# Patient Record
Sex: Female | Born: 1995 | Race: White | Hispanic: No | Marital: Married | Smoking: Current some day smoker
Health system: Southern US, Community
[De-identification: ages and names within clinical notes are randomized; demographics above are authoritative.]

## PROBLEM LIST (undated history)

## (undated) ENCOUNTER — Inpatient Hospital Stay (HOSPITAL_COMMUNITY): Payer: 59

## (undated) DIAGNOSIS — Z0000 Encounter for general adult medical examination without abnormal findings: Secondary | ICD-10-CM

## (undated) DIAGNOSIS — G8929 Other chronic pain: Secondary | ICD-10-CM

## (undated) DIAGNOSIS — M25532 Pain in left wrist: Secondary | ICD-10-CM

## (undated) DIAGNOSIS — N289 Disorder of kidney and ureter, unspecified: Secondary | ICD-10-CM

## (undated) DIAGNOSIS — F32A Depression, unspecified: Secondary | ICD-10-CM

## (undated) DIAGNOSIS — T7840XA Allergy, unspecified, initial encounter: Secondary | ICD-10-CM

## (undated) DIAGNOSIS — N1370 Vesicoureteral-reflux, unspecified: Secondary | ICD-10-CM

## (undated) DIAGNOSIS — F419 Anxiety disorder, unspecified: Secondary | ICD-10-CM

## (undated) DIAGNOSIS — N10 Acute tubulo-interstitial nephritis: Secondary | ICD-10-CM

## (undated) DIAGNOSIS — F909 Attention-deficit hyperactivity disorder, unspecified type: Secondary | ICD-10-CM

## (undated) DIAGNOSIS — E282 Polycystic ovarian syndrome: Secondary | ICD-10-CM

## (undated) DIAGNOSIS — N809 Endometriosis, unspecified: Secondary | ICD-10-CM

## (undated) DIAGNOSIS — F329 Major depressive disorder, single episode, unspecified: Secondary | ICD-10-CM

## (undated) DIAGNOSIS — K219 Gastro-esophageal reflux disease without esophagitis: Secondary | ICD-10-CM

## (undated) DIAGNOSIS — R1031 Right lower quadrant pain: Secondary | ICD-10-CM

## (undated) DIAGNOSIS — Z8489 Family history of other specified conditions: Secondary | ICD-10-CM

## (undated) DIAGNOSIS — R109 Unspecified abdominal pain: Secondary | ICD-10-CM

## (undated) DIAGNOSIS — E785 Hyperlipidemia, unspecified: Secondary | ICD-10-CM

## (undated) HISTORY — DX: Endometriosis, unspecified: N80.9

## (undated) HISTORY — DX: Vesicoureteral-reflux, unspecified: N13.70

## (undated) HISTORY — DX: Polycystic ovarian syndrome: E28.2

## (undated) HISTORY — DX: Hyperlipidemia, unspecified: E78.5

## (undated) HISTORY — DX: Allergy, unspecified, initial encounter: T78.40XA

## (undated) HISTORY — PX: TYMPANOSTOMY TUBE PLACEMENT: SHX32

## (undated) HISTORY — PX: URETER SURGERY: SHX823

## (undated) HISTORY — DX: Encounter for general adult medical examination without abnormal findings: Z00.00

## (undated) HISTORY — DX: Gastro-esophageal reflux disease without esophagitis: K21.9

## (undated) HISTORY — DX: Other chronic pain: G89.29

## (undated) HISTORY — DX: Unspecified abdominal pain: R10.9

## (undated) HISTORY — DX: Pain in left wrist: M25.532

## (undated) HISTORY — DX: Disorder of kidney and ureter, unspecified: N28.9

## (undated) HISTORY — DX: Right lower quadrant pain: R10.31

## (undated) HISTORY — DX: Attention-deficit hyperactivity disorder, unspecified type: F90.9

## (undated) HISTORY — PX: KIDNEY SURGERY: SHX687

---
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
## 2017-01-01 ENCOUNTER — Other Ambulatory Visit: Payer: Self-pay

## 2017-01-01 ENCOUNTER — Other Ambulatory Visit (INDEPENDENT_AMBULATORY_CARE_PROVIDER_SITE_OTHER): Payer: 59

## 2017-01-01 DIAGNOSIS — R5383 Other fatigue: Secondary | ICD-10-CM | POA: Diagnosis not present

## 2017-01-01 DIAGNOSIS — R059 Cough, unspecified: Secondary | ICD-10-CM

## 2017-01-01 DIAGNOSIS — R05 Cough: Secondary | ICD-10-CM

## 2017-01-01 LAB — CBC WITH DIFFERENTIAL/PLATELET
BASOS ABS: 0.1 10*3/uL (ref 0.0–0.1)
Basophils Relative: 0.5 % (ref 0.0–3.0)
EOS ABS: 0.2 10*3/uL (ref 0.0–0.7)
Eosinophils Relative: 1.8 % (ref 0.0–5.0)
HEMATOCRIT: 43.4 % (ref 36.0–49.0)
HEMOGLOBIN: 14.8 g/dL (ref 12.0–16.0)
LYMPHS PCT: 23.6 % — AB (ref 24.0–48.0)
Lymphs Abs: 2.5 10*3/uL (ref 0.7–4.0)
MCHC: 34.1 g/dL (ref 31.0–37.0)
MCV: 87.8 fl (ref 78.0–98.0)
Monocytes Absolute: 0.5 10*3/uL (ref 0.1–1.0)
Monocytes Relative: 4.9 % (ref 3.0–12.0)
Neutro Abs: 7.4 10*3/uL (ref 1.4–7.7)
Neutrophils Relative %: 69.2 % (ref 43.0–71.0)
Platelets: 435 10*3/uL (ref 150.0–575.0)
RBC: 4.94 Mil/uL (ref 3.80–5.70)
RDW: 12.9 % (ref 11.4–15.5)
WBC: 10.6 10*3/uL (ref 4.5–13.5)

## 2017-01-01 LAB — BASIC METABOLIC PANEL
BUN: 9 mg/dL (ref 6–23)
CHLORIDE: 106 meq/L (ref 96–112)
CO2: 28 mEq/L (ref 19–32)
Calcium: 10 mg/dL (ref 8.4–10.5)
Creatinine, Ser: 0.7 mg/dL (ref 0.40–1.20)
GFR: 115 mL/min (ref >60.00)
GLUCOSE: 98 mg/dL (ref 70–99)
POTASSIUM: 4.2 meq/L (ref 3.5–5.1)
SODIUM: 140 meq/L (ref 135–145)

## 2017-02-03 ENCOUNTER — Ambulatory Visit: Payer: 59 | Admitting: Family Medicine

## 2017-02-23 ENCOUNTER — Telehealth: Payer: Self-pay | Admitting: Family Medicine

## 2017-02-23 DIAGNOSIS — Z309 Encounter for contraceptive management, unspecified: Secondary | ICD-10-CM

## 2017-02-23 NOTE — Telephone Encounter (Signed)
Referral placed.  Pc

## 2017-02-23 NOTE — Telephone Encounter (Signed)
Will send in referral to GYN  pc

## 2017-02-23 NOTE — Telephone Encounter (Signed)
°  Relation to UJ:[id number] Call back number:[phone]   Reason for call:  Patient requesting referral to GYN, patient would like etonogestrel (NEXPLANON) 68 MG IMPL implant removed and inserted, please advise

## 2017-02-23 NOTE — Telephone Encounter (Signed)
Dr Abner Greenspan stated she does not do them she can go to Dr. Carmelia Roller if she wants to.    PC

## 2017-03-25 ENCOUNTER — Encounter (HOSPITAL_COMMUNITY): Payer: Self-pay | Admitting: Emergency Medicine

## 2017-03-25 ENCOUNTER — Emergency Department (HOSPITAL_COMMUNITY): Payer: 59

## 2017-03-25 ENCOUNTER — Emergency Department (HOSPITAL_COMMUNITY)
Admission: EM | Admit: 2017-03-25 | Discharge: 2017-03-25 | Disposition: A | Discharge status: Home / Self Care | Payer: 59 | Attending: Emergency Medicine | Admitting: Emergency Medicine

## 2017-03-25 DIAGNOSIS — R109 Unspecified abdominal pain: Secondary | ICD-10-CM | POA: Diagnosis present

## 2017-03-25 DIAGNOSIS — Z793 Long term (current) use of hormonal contraceptives: Secondary | ICD-10-CM | POA: Insufficient documentation

## 2017-03-25 DIAGNOSIS — Z87891 Personal history of nicotine dependence: Secondary | ICD-10-CM | POA: Diagnosis not present

## 2017-03-25 DIAGNOSIS — R1031 Right lower quadrant pain: Secondary | ICD-10-CM | POA: Insufficient documentation

## 2017-03-25 LAB — COMPREHENSIVE METABOLIC PANEL
ALT: 9 U/L — ABNORMAL LOW (ref 14–54)
AST: 19 U/L (ref 15–41)
Albumin: 4 g/dL (ref 3.5–5.0)
Alkaline Phosphatase: 94 U/L (ref 38–126)
Anion gap: 9 (ref 5–15)
BUN: 9 mg/dL (ref 6–20)
CO2: 22 mmol/L (ref 22–32)
Calcium: 9.5 mg/dL (ref 8.9–10.3)
Chloride: 108 mmol/L (ref 101–111)
Creatinine, Ser: 0.75 mg/dL (ref 0.44–1.00)
GFR calc Af Amer: >60 mL/min (ref >60)
GFR calc non Af Amer: >60 mL/min (ref >60)
Glucose, Bld: 86 mg/dL (ref 65–99)
Potassium: 3.9 mmol/L (ref 3.5–5.1)
Sodium: 139 mmol/L (ref 135–145)
Total Bilirubin: 1 mg/dL (ref 0.3–1.2)
Total Protein: 7 g/dL (ref 6.5–8.1)

## 2017-03-25 LAB — URINALYSIS, ROUTINE W REFLEX MICROSCOPIC
Bilirubin Urine: NEGATIVE
Glucose, UA: NEGATIVE mg/dL
Hgb urine dipstick: NEGATIVE
Ketones, ur: NEGATIVE mg/dL
Leukocytes, UA: NEGATIVE
Nitrite: NEGATIVE
Protein, ur: NEGATIVE mg/dL
Specific Gravity, Urine: 1.023 (ref 1.005–1.030)
pH: 7 (ref 5.0–8.0)

## 2017-03-25 LAB — CBC WITH DIFFERENTIAL/PLATELET
Basophils Absolute: 0 10*3/uL (ref 0.0–0.1)
Basophils Relative: 0 %
Eosinophils Absolute: 0.2 10*3/uL (ref 0.0–0.7)
Eosinophils Relative: 1 %
HCT: 41.5 % (ref 36.0–46.0)
Hemoglobin: 14.1 g/dL (ref 12.0–15.0)
Lymphocytes Relative: 24 %
Lymphs Abs: 2.9 10*3/uL (ref 0.7–4.0)
MCH: 29.8 pg (ref 26.0–34.0)
MCHC: 34 g/dL (ref 30.0–36.0)
MCV: 87.7 fL (ref 78.0–100.0)
Monocytes Absolute: 0.7 10*3/uL (ref 0.1–1.0)
Monocytes Relative: 6 %
Neutro Abs: 8.3 10*3/uL — ABNORMAL HIGH (ref 1.7–7.7)
Neutrophils Relative %: 69 %
Platelets: 351 10*3/uL (ref 150–400)
RBC: 4.73 MIL/uL (ref 3.87–5.11)
RDW: 12.6 % (ref 11.5–15.5)
WBC: 12 10*3/uL — ABNORMAL HIGH (ref 4.0–10.5)

## 2017-03-25 LAB — LIPASE, BLOOD: Lipase: 23 U/L (ref 11–51)

## 2017-03-25 LAB — PREGNANCY, URINE: Preg Test, Ur: NEGATIVE

## 2017-03-25 MED ORDER — KETOROLAC TROMETHAMINE 30 MG/ML IJ SOLN
30.0000 mg | Freq: Once | INTRAMUSCULAR | Status: AC
  Administered 2017-03-25: 30 mg via INTRAVENOUS
  Filled 2017-03-25: qty 1

## 2017-03-25 MED ORDER — ONDANSETRON HCL 4 MG/2ML IJ SOLN
4.0000 mg | Freq: Once | INTRAMUSCULAR | Status: AC
  Administered 2017-03-25: 4 mg via INTRAVENOUS
  Filled 2017-03-25: qty 2

## 2017-03-25 MED ORDER — SODIUM CHLORIDE 0.9 % IV BOLUS (SEPSIS)
1000.0000 mL | Freq: Once | INTRAVENOUS | Status: AC
  Administered 2017-03-25: 1000 mL via INTRAVENOUS

## 2017-03-25 MED ORDER — IOPAMIDOL (ISOVUE-300) INJECTION 61%
INTRAVENOUS | Status: AC
  Filled 2017-03-25: qty 100

## 2017-03-25 MED ORDER — PROMETHAZINE HCL 25 MG PO TABS: 25.0000 mg | ORAL_TABLET | Freq: Three times a day (TID) | ORAL | 0 refills | Status: DC | PRN

## 2017-03-25 MED ORDER — TRAMADOL HCL 50 MG PO TABS: 50.0000 mg | ORAL_TABLET | Freq: Four times a day (QID) | ORAL | 0 refills | Status: DC | PRN

## 2017-03-25 NOTE — ED Triage Notes (Signed)
Pt sts right sided lower and flank pain x 3 days

## 2017-03-25 NOTE — ED Provider Notes (Signed)
[facility]-EMERGENCY DEPT Provider Note   CSN: [id number] Arrival date & time: 03/25/17  [id number]     History   Chief Complaint Chief Complaint  Patient presents with  . Abdominal Pain    HPI Misty Jimenez is a 21 y.o. female.  HPI Patient presents to the emergency department with right-sided abdominal pain that started yesterday.  She states she is chronically at payments area of is only when palpated, now it has been constant since last night.  She has also had nausea but no vomiting.  Patient states that nothing seems make the condition better.  She states palpation, does make the pain worse. The patient denies chest pain, shortness of breath, headache,blurred vision, neck pain, fever, cough, weakness, numbness, dizziness, anorexia, edema,  vomiting, diarrhea, rash, back pain, dysuria, hematemesis, bloody stool, near syncope, or syncope. Past Medical History:  Diagnosis Date  . Abdominal pain, chronic, right lower quadrant 08/03/2013  . Abdominal pain, recurrent    With Headache  . Acute pyelonephritis 05/07/2016   kidney surgery at 576 months of age  . ADHD (attention deficit hyperactivity disorder)   . Allergy   . Anxiety   . Depression   . Endometriosis   . Family history of adverse reaction to anesthesia    "grandmother gets PONV"  . GE reflux 12/16/2011  . Hyperlipidemia   . Kidney disease 05/07/2016  . PCOS (polycystic ovarian syndrome)   . Preventative health care 11/04/2016  . Reflux, vesicoureteral   . Wrist pain, left 11/04/2016    Patient Active Problem List   Diagnosis Date Noted  . Wrist pain, left 11/04/2016  . Preventative health care 11/04/2016  . Hyperlipidemia   . Obesity 05/07/2016  . Endometriosis 05/07/2016  . Kidney disease 05/07/2016  . PCOS (polycystic ovarian syndrome) 08/29/2013  . ADHD (attention deficit hyperactivity disorder) 08/03/2013  . 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 KIDNEY SURGERY     As a small child  . TYMPANOSTOMY TUBE PLACEMENT    . URETER SURGERY    . WRIST SURGERY Left 05/2016   10 pins and 2 plates    OB History    No data available       Home Medications    Prior to Admission medications   Medication Sig Start Date End Date Taking? Authorizing Provider  acetaminophen (TYLENOL) 500 MG tablet Take 500 mg by mouth every 6 (six) hours as needed for moderate pain.    [provider]  etonogestrel (NEXPLANON) 68 MG IMPL implant 1 each (68 mg total) by Subdermal route once. 11/24/14   Owens SharkPerry, Martha F, MD  ibuprofen (ADVIL,MOTRIN) 600 MG tablet Take 1 tablet (600 mg total) by mouth every 6 (six) hours as needed for headache or moderate pain. Patient not taking: Reported on 11/03/2016 05/10/16   Lora PaulaKrall, Jennifer T, MD  ondansetron (ZOFRAN-ODT) 8 MG disintegrating tablet Take 1 tablet (8 mg total) by mouth every 8 (eight) hours as needed for nausea or vomiting. Patient not taking: Reported on 11/03/2016 05/10/16   Lora PaulaKrall, Jennifer T, MD  polyethylene glycol [facility](MIRALAX / Ethelene HalGLYCOLAX) packet Take 17 g by mouth daily as needed for mild constipation. Patient not taking: Reported on 11/03/2016 05/10/16   Lora PaulaKrall, Jennifer T, MD  promethazine (PHENERGAN) 25 MG tablet Take 1 tablet (25 mg total) by mouth every 4 (four) hours as needed for nausea or vomiting. Patient not taking: Reported on 11/03/2016 05/10/16  Lora Paula, MD  senna (SENOKOT) 8.6 MG TABS tablet Take 2 tablets (17.2 mg total) by mouth at bedtime as needed for mild constipation. Patient not taking: Reported on 11/03/2016 05/10/16   Lora Paula, MD    Family History Family History  Problem Relation Age of Onset  . Kidney disease Mother   . Migraines Maternal Aunt   . Diabetes Maternal Grandfather   . Hyperlipidemia Other   . Hypertension Other   . Depression Other   . Alcohol abuse Father        and drug use, heroin, cocaine, marijuana  . Cancer Paternal Aunt         colon in 64s deceased    Social History Social History  Substance Use Topics  . Smoking status: Former Smoker    Years: 0.50    Types: Cigarettes    Quit date: 10/28/2015  . Smokeless tobacco: Former Neurosurgeon    Types: Chew  . Alcohol use Yes     Comment: 05/07/2016 "might have a few drinks/year"     Allergies   Sulfa antibiotics; Sulfasalazine; Other; Monosodium glutamate; and Sulfa drugs cross reactors   Review of Systems Review of Systems  All other systems negative except as documented in the HPI. All pertinent positives and negatives as reviewed in the HPI. Physical Exam Updated Vital Signs BP (!) 125/91   Pulse 69   Temp 98.5 F (36.9 C) (Oral)   Resp 20   SpO2 100%   Physical Exam  Constitutional: She is oriented to person, place, and time. She appears well-developed and well-nourished. No distress.  HENT:  Head: Normocephalic and atraumatic.  Mouth/Throat: Oropharynx is clear and moist.  Eyes: Pupils are equal, round, and reactive to light.  Neck: Normal range of motion. Neck supple.  Cardiovascular: Normal rate, regular rhythm and normal heart sounds.  Exam reveals no gallop and no friction rub.   No murmur heard. Pulmonary/Chest: Effort normal and breath sounds normal. No respiratory distress. She has no wheezes.  Abdominal: Soft. Bowel sounds are normal. She exhibits no distension. There is tenderness. There is no rebound and no guarding.    Neurological: She is alert and oriented to person, place, and time. She exhibits normal muscle tone. Coordination normal.  Skin: Skin is warm and dry. Capillary refill takes less than 2 seconds. No rash noted. No erythema.  Psychiatric: She has a normal mood and affect. Her behavior is normal.  Nursing note and vitals reviewed.    ED Treatments / Results  Labs (all labs ordered are listed, but only abnormal results are displayed) Labs Reviewed  COMPREHENSIVE METABOLIC PANEL - Abnormal; Notable for the following:         Result Value   ALT 9 (*)    All other components within normal limits  CBC WITH DIFFERENTIAL/PLATELET - Abnormal; Notable for the following:    WBC 12.0 (*)    Neutro Abs 8.3 (*)    All other components within normal limits  URINALYSIS, ROUTINE W REFLEX MICROSCOPIC  PREGNANCY, URINE  LIPASE, BLOOD    EKG  EKG Interpretation None       Radiology Ct Abdomen Pelvis W Contrast  Result Date: 03/25/2017 CLINICAL DATA:  Right lower quadrant pain for 3 days. History of polycystic ovarian syndrome. EXAM: CT ABDOMEN AND PELVIS WITH CONTRAST TECHNIQUE: Multidetector CT imaging of the abdomen and pelvis was performed using the standard protocol following bolus administration of intravenous contrast. CONTRAST:  100 cc of Isovue-300 COMPARISON:  05/08/2016 FINDINGS: Lower chest: Clear lung bases. Normal heart size without pericardial or pleural effusion. Hepatobiliary: Hepatomegaly at 19.4 cm craniocaudal. No focal liver lesion. Normal gallbladder, without biliary ductal dilatation. Pancreas: Normal, without mass or ductal dilatation. Spleen: Normal in size, without focal abnormality. Adrenals/Urinary Tract: Normal adrenal glands. Normal kidneys, without hydronephrosis. Normal urinary bladder. Stomach/Bowel: Normal stomach, without wall thickening. Scattered colonic diverticula. Normal terminal ileum. Probable diminutive appendix on image 64/ series 3. Normal small bowel. Vascular/Lymphatic: Normal caliber of the aorta and branch vessels. No abdominopelvic adenopathy. Reproductive: Normal uterus and adnexa. Other: No significant free fluid. Musculoskeletal: No acute osseous abnormality. IMPRESSION: 1.  No acute process or explanation for right lower quadrant pain. 2. Hepatomegaly. Electronically Signed   By: Jeronimo Greaves M.D.   On: 03/25/2017 13:08    Procedures Procedures (including critical care time)  Medications Ordered in ED Medications  iopamidol (ISOVUE-300) 61 % injection (not  administered)  sodium chloride 0.9 % bolus 1,000 mL (1,000 mLs Intravenous New Bag/Given 03/25/17 1123)  ondansetron (ZOFRAN) injection 4 mg (4 mg Intravenous Given 03/25/17 1314)     Initial Impression / Assessment and Plan / ED Course  I have reviewed the triage vital signs and the nursing notes.  Pertinent labs & imaging results that were available during my care of the patient were reviewed by me and considered in my medical decision making (see chart for details).    Patient's been observed over many hours in the emergency department.  She is received IV fluids along with pain medication and antiemetics.  Patient, CT scan does not show any significant abnormalities.  I do feel the patient will need follow-up with GI as this could be another etiology not yet detected on CT scan.  Patient is advised plan and all questions were answered  Final Clinical Impressions(s) / ED Diagnoses   Final diagnoses:  None    New Prescriptions New Prescriptions   No medications on file     Charlestine Night, PA-C 03/25/17 1413    Pricilla Loveless, MD 04/01/17 2356

## 2017-03-25 NOTE — Discharge Instructions (Signed)
Follow-up with the GI Dr. Provided.  Return here as needed.  Your CT scan did not show any significant abnormalities are laboratory testing also did not show any significant findings.

## 2017-03-25 NOTE — ED Notes (Signed)
Pt is in stable condition upon d/c and ambulates from ED. 

## 2017-04-20 NOTE — Telephone Encounter (Signed)
Done

## 2017-05-08 ENCOUNTER — Ambulatory Visit: Payer: 59 | Admitting: Family Medicine

## 2017-06-11 ENCOUNTER — Ambulatory Visit: Payer: 59 | Admitting: Family Medicine

## 2017-07-03 ENCOUNTER — Encounter: Payer: Self-pay | Admitting: Family Medicine

## 2017-07-03 ENCOUNTER — Ambulatory Visit (INDEPENDENT_AMBULATORY_CARE_PROVIDER_SITE_OTHER): Payer: 59 | Admitting: Family Medicine

## 2017-07-03 VITALS — BP 121/84 | HR 58 | Ht 67.0 in | Wt 265.0 lb

## 2017-07-03 DIAGNOSIS — Z01812 Encounter for preprocedural laboratory examination: Secondary | ICD-10-CM

## 2017-07-03 DIAGNOSIS — Z30017 Encounter for initial prescription of implantable subdermal contraceptive: Secondary | ICD-10-CM

## 2017-07-03 DIAGNOSIS — Z3202 Encounter for pregnancy test, result negative: Secondary | ICD-10-CM | POA: Diagnosis not present

## 2017-07-03 DIAGNOSIS — Z3046 Encounter for surveillance of implantable subdermal contraceptive: Secondary | ICD-10-CM

## 2017-07-03 DIAGNOSIS — Z3049 Encounter for surveillance of other contraceptives: Secondary | ICD-10-CM

## 2017-07-03 LAB — POCT URINE PREGNANCY: Preg Test, Ur: NEGATIVE

## 2017-07-03 MED ORDER — ETONOGESTREL 68 MG ~~LOC~~ IMPL
68.0000 mg | DRUG_IMPLANT | Freq: Once | SUBCUTANEOUS | Status: AC
  Administered 2017-07-03: 68 mg via SUBCUTANEOUS

## 2017-07-03 NOTE — Addendum Note (Signed)
Addended by: Anell BarrHOWARD, Dericka Ostenson L on: 07/03/2017 11:58 AM   Modules accepted: Orders

## 2017-07-03 NOTE — Progress Notes (Signed)
  Nexplanon Removal:  Patient given informed consent for removal of her Implanon, time out was performed.  Signed copy in the chart.  Appropriate time out taken. Implanon site identified.  Area prepped in usual sterile fashon. 8mL of 1% lidocaine was used to anesthetize the area at the distal end of the implant. A small stab incision was made right beside the implant on the distal portion.  The implanon rod was grasped using hemostats and removed without difficulty.  There was less than 3 cc blood loss. There were no complications.  A small amount of antibiotic ointment and steri-strips were applied over the small incision.  A pressure bandage was applied to reduce any bruising.  The patient tolerated the procedure well and was given post procedure instructions.   Nexplanon Insertion:  Patient given informed consent, signed copy in the chart, time out was performed. Pregnancy test was neg. Appropriate time out taken.  Patient's left arm was prepped and draped in the usual sterile fashion.. The ruler used to measure and mark insertion area.  Pt was prepped with alcohol swab and then injected with 8 cc of 1% lidocaine with epinephrine.  Pt was prepped with betadine, Implanon removed form packaging,  Device confirmed in needle, then inserted full length of needle and withdrawn per handbook instructions.  Device palpated by physician and patient.  Pt insertion site covered with pressure dressing.   Minimal blood loss.  Pt tolerated the procedure well.

## 2017-07-23 DIAGNOSIS — N719 Inflammatory disease of uterus, unspecified: Secondary | ICD-10-CM | POA: Insufficient documentation

## 2017-07-23 HISTORY — DX: Inflammatory disease of uterus, unspecified: N71.9

## 2017-10-29 ENCOUNTER — Encounter: Payer: 59 | Admitting: Family Medicine

## 2018-03-12 ENCOUNTER — Other Ambulatory Visit: Payer: Self-pay

## 2018-07-27 ENCOUNTER — Emergency Department (HOSPITAL_BASED_OUTPATIENT_CLINIC_OR_DEPARTMENT_OTHER): Payer: 59

## 2018-07-27 ENCOUNTER — Other Ambulatory Visit: Payer: Self-pay

## 2018-07-27 ENCOUNTER — Inpatient Hospital Stay (HOSPITAL_BASED_OUTPATIENT_CLINIC_OR_DEPARTMENT_OTHER)
Admission: EM | Admit: 2018-07-27 | Discharge: 2018-07-29 | DRG: 202 | Disposition: A | Discharge status: Home / Self Care | Payer: 59 | Attending: Family Medicine | Admitting: Family Medicine

## 2018-07-27 ENCOUNTER — Encounter (HOSPITAL_BASED_OUTPATIENT_CLINIC_OR_DEPARTMENT_OTHER): Payer: Self-pay

## 2018-07-27 DIAGNOSIS — J209 Acute bronchitis, unspecified: Secondary | ICD-10-CM | POA: Diagnosis present

## 2018-07-27 DIAGNOSIS — J206 Acute bronchitis due to rhinovirus: Secondary | ICD-10-CM | POA: Diagnosis not present

## 2018-07-27 DIAGNOSIS — D72829 Elevated white blood cell count, unspecified: Secondary | ICD-10-CM

## 2018-07-27 DIAGNOSIS — R000 Tachycardia, unspecified: Secondary | ICD-10-CM | POA: Diagnosis not present

## 2018-07-27 DIAGNOSIS — Z841 Family history of disorders of kidney and ureter: Secondary | ICD-10-CM

## 2018-07-27 DIAGNOSIS — R45851 Suicidal ideations: Secondary | ICD-10-CM

## 2018-07-27 DIAGNOSIS — E785 Hyperlipidemia, unspecified: Secondary | ICD-10-CM | POA: Diagnosis present

## 2018-07-27 DIAGNOSIS — Z818 Family history of other mental and behavioral disorders: Secondary | ICD-10-CM

## 2018-07-27 DIAGNOSIS — Z800 Family history of malignant neoplasm of digestive organs: Secondary | ICD-10-CM

## 2018-07-27 DIAGNOSIS — F17210 Nicotine dependence, cigarettes, uncomplicated: Secondary | ICD-10-CM | POA: Diagnosis present

## 2018-07-27 DIAGNOSIS — J9601 Acute respiratory failure with hypoxia: Secondary | ICD-10-CM | POA: Diagnosis present

## 2018-07-27 DIAGNOSIS — F332 Major depressive disorder, recurrent severe without psychotic features: Secondary | ICD-10-CM

## 2018-07-27 DIAGNOSIS — T59891A Toxic effect of other specified gases, fumes and vapors, accidental (unintentional), initial encounter: Secondary | ICD-10-CM | POA: Diagnosis present

## 2018-07-27 DIAGNOSIS — Z882 Allergy status to sulfonamides status: Secondary | ICD-10-CM

## 2018-07-27 DIAGNOSIS — Z811 Family history of alcohol abuse and dependence: Secondary | ICD-10-CM

## 2018-07-27 DIAGNOSIS — F909 Attention-deficit hyperactivity disorder, unspecified type: Secondary | ICD-10-CM | POA: Diagnosis present

## 2018-07-27 DIAGNOSIS — F17290 Nicotine dependence, other tobacco product, uncomplicated: Secondary | ICD-10-CM | POA: Diagnosis present

## 2018-07-27 DIAGNOSIS — E282 Polycystic ovarian syndrome: Secondary | ICD-10-CM | POA: Diagnosis present

## 2018-07-27 DIAGNOSIS — K219 Gastro-esophageal reflux disease without esophagitis: Secondary | ICD-10-CM | POA: Diagnosis present

## 2018-07-27 DIAGNOSIS — Z833 Family history of diabetes mellitus: Secondary | ICD-10-CM

## 2018-07-27 DIAGNOSIS — J40 Bronchitis, not specified as acute or chronic: Secondary | ICD-10-CM

## 2018-07-27 DIAGNOSIS — Z888 Allergy status to other drugs, medicaments and biological substances status: Secondary | ICD-10-CM

## 2018-07-27 DIAGNOSIS — Z915 Personal history of self-harm: Secondary | ICD-10-CM

## 2018-07-27 LAB — URINALYSIS, ROUTINE W REFLEX MICROSCOPIC
BILIRUBIN URINE: NEGATIVE
Glucose, UA: NEGATIVE mg/dL
Hgb urine dipstick: NEGATIVE
KETONES UR: NEGATIVE mg/dL
Leukocytes, UA: NEGATIVE
NITRITE: NEGATIVE
PH: 6.5 (ref 5.0–8.0)
Protein, ur: NEGATIVE mg/dL
Specific Gravity, Urine: 1.015 (ref 1.005–1.030)

## 2018-07-27 LAB — TROPONIN I

## 2018-07-27 LAB — CBC WITH DIFFERENTIAL/PLATELET
BASOS PCT: 0 %
Basophils Absolute: 0 10*3/uL (ref 0.0–0.1)
Eosinophils Absolute: 0.2 10*3/uL (ref 0.0–0.7)
Eosinophils Relative: 1 %
HCT: 43.6 % (ref 36.0–46.0)
HEMOGLOBIN: 15.1 g/dL — AB (ref 12.0–15.0)
Lymphocytes Relative: 9 %
Lymphs Abs: 1.8 10*3/uL (ref 0.7–4.0)
MCH: 30.8 pg (ref 26.0–34.0)
MCHC: 34.6 g/dL (ref 30.0–36.0)
MCV: 89 fL (ref 78.0–100.0)
MONO ABS: 1.2 10*3/uL — AB (ref 0.1–1.0)
MONOS PCT: 6 %
NEUTROS PCT: 84 %
Neutro Abs: 17.3 10*3/uL — ABNORMAL HIGH (ref 1.7–7.7)
Platelets: 361 10*3/uL (ref 150–400)
RBC: 4.9 MIL/uL (ref 3.87–5.11)
RDW: 12.9 % (ref 11.5–15.5)
WBC: 20.5 10*3/uL — ABNORMAL HIGH (ref 4.0–10.5)

## 2018-07-27 LAB — BASIC METABOLIC PANEL
Anion gap: 8 (ref 5–15)
BUN: 8 mg/dL (ref 6–20)
CALCIUM: 9.2 mg/dL (ref 8.9–10.3)
CO2: 23 mmol/L (ref 22–32)
CREATININE: 0.83 mg/dL (ref 0.44–1.00)
Chloride: 109 mmol/L (ref 98–111)
GFR calc non Af Amer: >60 mL/min (ref >60)
Glucose, Bld: 96 mg/dL (ref 70–99)
Potassium: 3.6 mmol/L (ref 3.5–5.1)
SODIUM: 140 mmol/L (ref 135–145)

## 2018-07-27 LAB — PREGNANCY, URINE: Preg Test, Ur: NEGATIVE

## 2018-07-27 MED ORDER — IPRATROPIUM-ALBUTEROL 0.5-2.5 (3) MG/3ML IN SOLN
3.0000 mL | Freq: Four times a day (QID) | RESPIRATORY_TRACT | Status: DC
  Administered 2018-07-27 – 2018-07-28 (×2): 3 mL via RESPIRATORY_TRACT
  Filled 2018-07-27 (×2): qty 3

## 2018-07-27 MED ORDER — ONDANSETRON HCL 4 MG/2ML IJ SOLN
4.0000 mg | Freq: Once | INTRAMUSCULAR | Status: AC
  Administered 2018-07-28: 4 mg via INTRAVENOUS
  Filled 2018-07-27: qty 2

## 2018-07-27 NOTE — ED Notes (Signed)
Pt rang out and stated she "is about to pass out."  Sat pt up in bed and put the pulse ox on her, encouraged to take slow breaths in through her nose and out through her mouth. Pt verbalized relief of symptoms

## 2018-07-27 NOTE — ED Triage Notes (Signed)
C/o lower back, bilat flank area pain x 1 week-NAD-steady gait

## 2018-07-28 ENCOUNTER — Encounter (HOSPITAL_COMMUNITY): Payer: Self-pay | Admitting: Internal Medicine

## 2018-07-28 ENCOUNTER — Other Ambulatory Visit: Payer: Self-pay

## 2018-07-28 ENCOUNTER — Inpatient Hospital Stay (HOSPITAL_COMMUNITY): Payer: 59

## 2018-07-28 DIAGNOSIS — J206 Acute bronchitis due to rhinovirus: Secondary | ICD-10-CM | POA: Diagnosis not present

## 2018-07-28 DIAGNOSIS — F17290 Nicotine dependence, other tobacco product, uncomplicated: Secondary | ICD-10-CM | POA: Diagnosis not present

## 2018-07-28 DIAGNOSIS — E785 Hyperlipidemia, unspecified: Secondary | ICD-10-CM | POA: Diagnosis not present

## 2018-07-28 DIAGNOSIS — Z811 Family history of alcohol abuse and dependence: Secondary | ICD-10-CM | POA: Diagnosis not present

## 2018-07-28 DIAGNOSIS — Z833 Family history of diabetes mellitus: Secondary | ICD-10-CM | POA: Diagnosis not present

## 2018-07-28 DIAGNOSIS — R000 Tachycardia, unspecified: Secondary | ICD-10-CM | POA: Diagnosis present

## 2018-07-28 DIAGNOSIS — J9601 Acute respiratory failure with hypoxia: Secondary | ICD-10-CM | POA: Diagnosis present

## 2018-07-28 DIAGNOSIS — J209 Acute bronchitis, unspecified: Secondary | ICD-10-CM | POA: Diagnosis present

## 2018-07-28 DIAGNOSIS — Z818 Family history of other mental and behavioral disorders: Secondary | ICD-10-CM | POA: Diagnosis not present

## 2018-07-28 DIAGNOSIS — F909 Attention-deficit hyperactivity disorder, unspecified type: Secondary | ICD-10-CM | POA: Diagnosis not present

## 2018-07-28 DIAGNOSIS — R45851 Suicidal ideations: Secondary | ICD-10-CM

## 2018-07-28 DIAGNOSIS — G4700 Insomnia, unspecified: Secondary | ICD-10-CM | POA: Diagnosis not present

## 2018-07-28 DIAGNOSIS — Z915 Personal history of self-harm: Secondary | ICD-10-CM | POA: Diagnosis not present

## 2018-07-28 DIAGNOSIS — Z882 Allergy status to sulfonamides status: Secondary | ICD-10-CM | POA: Diagnosis not present

## 2018-07-28 DIAGNOSIS — K219 Gastro-esophageal reflux disease without esophagitis: Secondary | ICD-10-CM | POA: Diagnosis not present

## 2018-07-28 DIAGNOSIS — Z888 Allergy status to other drugs, medicaments and biological substances status: Secondary | ICD-10-CM | POA: Diagnosis not present

## 2018-07-28 DIAGNOSIS — Z800 Family history of malignant neoplasm of digestive organs: Secondary | ICD-10-CM | POA: Diagnosis not present

## 2018-07-28 DIAGNOSIS — E282 Polycystic ovarian syndrome: Secondary | ICD-10-CM | POA: Diagnosis not present

## 2018-07-28 DIAGNOSIS — F419 Anxiety disorder, unspecified: Secondary | ICD-10-CM | POA: Diagnosis not present

## 2018-07-28 DIAGNOSIS — F17210 Nicotine dependence, cigarettes, uncomplicated: Secondary | ICD-10-CM | POA: Diagnosis not present

## 2018-07-28 DIAGNOSIS — Z841 Family history of disorders of kidney and ureter: Secondary | ICD-10-CM | POA: Diagnosis not present

## 2018-07-28 DIAGNOSIS — F332 Major depressive disorder, recurrent severe without psychotic features: Secondary | ICD-10-CM | POA: Diagnosis present

## 2018-07-28 DIAGNOSIS — T59891A Toxic effect of other specified gases, fumes and vapors, accidental (unintentional), initial encounter: Secondary | ICD-10-CM | POA: Diagnosis not present

## 2018-07-28 LAB — PROCALCITONIN: Procalcitonin: <0.1 ng/mL

## 2018-07-28 LAB — CBC WITH DIFFERENTIAL/PLATELET
Basophils Absolute: 0 10*3/uL (ref 0.0–0.1)
Basophils Relative: 0 %
Eosinophils Absolute: 0 10*3/uL (ref 0.0–0.7)
Eosinophils Relative: 0 %
HCT: 38.7 % (ref 36.0–46.0)
HEMOGLOBIN: 13 g/dL (ref 12.0–15.0)
LYMPHS ABS: 0.5 10*3/uL — AB (ref 0.7–4.0)
Lymphocytes Relative: 3 %
MCH: 30.7 pg (ref 26.0–34.0)
MCHC: 33.6 g/dL (ref 30.0–36.0)
MCV: 91.5 fL (ref 78.0–100.0)
MONO ABS: 0.1 10*3/uL (ref 0.1–1.0)
MONOS PCT: 1 %
NEUTROS PCT: 96 %
Neutro Abs: 16.4 10*3/uL — ABNORMAL HIGH (ref 1.7–7.7)
PLATELETS: 351 10*3/uL (ref 150–400)
RBC: 4.23 MIL/uL (ref 3.87–5.11)
RDW: 13.1 % (ref 11.5–15.5)
WBC: 17 10*3/uL — ABNORMAL HIGH (ref 4.0–10.5)

## 2018-07-28 LAB — BASIC METABOLIC PANEL
Anion gap: 12 (ref 5–15)
BUN: 8 mg/dL (ref 6–20)
CHLORIDE: 113 mmol/L — AB (ref 98–111)
CO2: 20 mmol/L — AB (ref 22–32)
CREATININE: 0.78 mg/dL (ref 0.44–1.00)
Calcium: 8.6 mg/dL — ABNORMAL LOW (ref 8.9–10.3)
GFR calc non Af Amer: >60 mL/min (ref >60)
GLUCOSE: 157 mg/dL — AB (ref 70–99)
Potassium: 3.4 mmol/L — ABNORMAL LOW (ref 3.5–5.1)
Sodium: 145 mmol/L (ref 135–145)

## 2018-07-28 LAB — STREP PNEUMONIAE URINARY ANTIGEN: STREP PNEUMO URINARY ANTIGEN: NEGATIVE

## 2018-07-28 LAB — RESPIRATORY PANEL BY PCR
ADENOVIRUS-RVPPCR: NOT DETECTED
Bordetella pertussis: NOT DETECTED
CHLAMYDOPHILA PNEUMONIAE-RVPPCR: NOT DETECTED
CORONAVIRUS HKU1-RVPPCR: NOT DETECTED
CORONAVIRUS NL63-RVPPCR: NOT DETECTED
CORONAVIRUS OC43-RVPPCR: NOT DETECTED
Coronavirus 229E: NOT DETECTED
INFLUENZA A-RVPPCR: NOT DETECTED
Influenza B: NOT DETECTED
METAPNEUMOVIRUS-RVPPCR: NOT DETECTED
Mycoplasma pneumoniae: NOT DETECTED
PARAINFLUENZA VIRUS 1-RVPPCR: NOT DETECTED
PARAINFLUENZA VIRUS 2-RVPPCR: NOT DETECTED
PARAINFLUENZA VIRUS 3-RVPPCR: NOT DETECTED
PARAINFLUENZA VIRUS 4-RVPPCR: NOT DETECTED
RHINOVIRUS / ENTEROVIRUS - RVPPCR: DETECTED — AB
Respiratory Syncytial Virus: NOT DETECTED

## 2018-07-28 LAB — HIV ANTIBODY (ROUTINE TESTING W REFLEX): HIV Screen 4th Generation wRfx: NONREACTIVE

## 2018-07-28 LAB — HEPATIC FUNCTION PANEL
ALBUMIN: 3.6 g/dL (ref 3.5–5.0)
ALT: 9 U/L (ref 0–44)
AST: 22 U/L (ref 15–41)
Alkaline Phosphatase: 75 U/L (ref 38–126)
BILIRUBIN INDIRECT: 0.6 mg/dL (ref 0.3–0.9)
Bilirubin, Direct: 0.1 mg/dL (ref 0.0–0.2)
TOTAL PROTEIN: 6.4 g/dL — AB (ref 6.5–8.1)
Total Bilirubin: 0.7 mg/dL (ref 0.3–1.2)

## 2018-07-28 LAB — TSH: TSH: 0.774 u[IU]/mL (ref 0.350–4.500)

## 2018-07-28 LAB — RAPID URINE DRUG SCREEN, HOSP PERFORMED
AMPHETAMINES: NOT DETECTED
BARBITURATES: NOT DETECTED
BENZODIAZEPINES: POSITIVE — AB
COCAINE: NOT DETECTED
Opiates: NOT DETECTED
Tetrahydrocannabinol: POSITIVE — AB

## 2018-07-28 LAB — LACTIC ACID, PLASMA
Lactic Acid, Venous: 3.8 mmol/L (ref 0.5–1.9)
Lactic Acid, Venous: 3.2 mmol/L (ref 0.5–1.9)

## 2018-07-28 LAB — INFLUENZA PANEL BY PCR (TYPE A & B)
INFLBPCR: NEGATIVE
Influenza A By PCR: NEGATIVE

## 2018-07-28 LAB — I-STAT CG4 LACTIC ACID, ED: Lactic Acid, Venous: 2.83 mmol/L (ref 0.5–1.9)

## 2018-07-28 LAB — D-DIMER, QUANTITATIVE: D-Dimer, Quant: <0.27 ug/mL-FEU (ref 0.00–0.50)

## 2018-07-28 LAB — TROPONIN I: Troponin I: <0.03 ng/mL (ref <0.03)

## 2018-07-28 MED ORDER — SODIUM CHLORIDE 0.9 % IV BOLUS
1000.0000 mL | Freq: Once | INTRAVENOUS | Status: AC
  Administered 2018-07-28: 1000 mL via INTRAVENOUS

## 2018-07-28 MED ORDER — ALBUTEROL SULFATE (2.5 MG/3ML) 0.083% IN NEBU
5.0000 mg | INHALATION_SOLUTION | Freq: Once | RESPIRATORY_TRACT | Status: AC
  Administered 2018-07-28: 5 mg via RESPIRATORY_TRACT
  Filled 2018-07-28: qty 6

## 2018-07-28 MED ORDER — IOPAMIDOL (ISOVUE-370) INJECTION 76%
100.0000 mL | Freq: Once | INTRAVENOUS | Status: AC | PRN
  Administered 2018-07-28: 100 mL via INTRAVENOUS

## 2018-07-28 MED ORDER — ENSURE ENLIVE PO LIQD
237.0000 mL | Freq: Two times a day (BID) | ORAL | Status: DC
  Administered 2018-07-28 – 2018-07-29 (×2): 237 mL via ORAL

## 2018-07-28 MED ORDER — IPRATROPIUM BROMIDE 0.02 % IN SOLN: 0.5000 mg | RESPIRATORY_TRACT | Status: DC

## 2018-07-28 MED ORDER — LEVOFLOXACIN IN D5W 750 MG/150ML IV SOLN
750.0000 mg | Freq: Every day | INTRAVENOUS | Status: DC
  Administered 2018-07-29: 750 mg via INTRAVENOUS
  Filled 2018-07-28: qty 150

## 2018-07-28 MED ORDER — AZITHROMYCIN 500 MG IV SOLR
INTRAVENOUS | Status: AC
  Filled 2018-07-28: qty 500

## 2018-07-28 MED ORDER — ACETAMINOPHEN 650 MG RE SUPP: 650.0000 mg | Freq: Four times a day (QID) | RECTAL | Status: DC | PRN

## 2018-07-28 MED ORDER — ONDANSETRON HCL 4 MG/2ML IJ SOLN
4.0000 mg | Freq: Four times a day (QID) | INTRAMUSCULAR | Status: DC | PRN
  Administered 2018-07-28 – 2018-07-29 (×3): 4 mg via INTRAVENOUS
  Filled 2018-07-28 (×3): qty 2

## 2018-07-28 MED ORDER — IPRATROPIUM-ALBUTEROL 0.5-2.5 (3) MG/3ML IN SOLN
3.0000 mL | Freq: Two times a day (BID) | RESPIRATORY_TRACT | Status: DC
  Administered 2018-07-28 – 2018-07-29 (×2): 3 mL via RESPIRATORY_TRACT
  Filled 2018-07-28 (×2): qty 3

## 2018-07-28 MED ORDER — ACETAMINOPHEN 325 MG PO TABS
650.0000 mg | ORAL_TABLET | Freq: Four times a day (QID) | ORAL | Status: DC | PRN
  Administered 2018-07-28 (×2): 650 mg via ORAL
  Filled 2018-07-28 (×2): qty 2

## 2018-07-28 MED ORDER — ALBUTEROL SULFATE (2.5 MG/3ML) 0.083% IN NEBU: 2.5000 mg | INHALATION_SOLUTION | RESPIRATORY_TRACT | Status: DC

## 2018-07-28 MED ORDER — SODIUM CHLORIDE 0.9 % IJ SOLN
INTRAMUSCULAR | Status: AC
  Filled 2018-07-28: qty 50

## 2018-07-28 MED ORDER — ONDANSETRON HCL 4 MG PO TABS: 4.0000 mg | ORAL_TABLET | Freq: Four times a day (QID) | ORAL | Status: DC | PRN

## 2018-07-28 MED ORDER — METHYLPREDNISOLONE SODIUM SUCC 125 MG IJ SOLR
125.0000 mg | Freq: Once | INTRAMUSCULAR | Status: AC
  Administered 2018-07-28: 125 mg via INTRAVENOUS
  Filled 2018-07-28: qty 2

## 2018-07-28 MED ORDER — SODIUM CHLORIDE 0.9 % IV SOLN
1.0000 g | Freq: Once | INTRAVENOUS | Status: AC
  Administered 2018-07-28: 1 g via INTRAVENOUS
  Filled 2018-07-28: qty 10

## 2018-07-28 MED ORDER — ARFORMOTEROL TARTRATE 15 MCG/2ML IN NEBU
15.0000 ug | INHALATION_SOLUTION | Freq: Two times a day (BID) | RESPIRATORY_TRACT | Status: DC
  Administered 2018-07-28 – 2018-07-29 (×2): 15 ug via RESPIRATORY_TRACT
  Filled 2018-07-28 (×2): qty 2

## 2018-07-28 MED ORDER — BUDESONIDE 0.25 MG/2ML IN SUSP
0.2500 mg | Freq: Two times a day (BID) | RESPIRATORY_TRACT | Status: DC
  Administered 2018-07-28 – 2018-07-29 (×3): 0.25 mg via RESPIRATORY_TRACT
  Filled 2018-07-28 (×3): qty 2

## 2018-07-28 MED ORDER — KETOROLAC TROMETHAMINE 30 MG/ML IJ SOLN
15.0000 mg | Freq: Once | INTRAMUSCULAR | Status: AC
Start: 2018-07-28 — End: 2018-07-28
  Administered 2018-07-28: 15 mg via INTRAVENOUS
  Filled 2018-07-28: qty 1

## 2018-07-28 MED ORDER — METHYLPREDNISOLONE SODIUM SUCC 40 MG IJ SOLR
40.0000 mg | Freq: Two times a day (BID) | INTRAMUSCULAR | Status: DC
  Administered 2018-07-28: 40 mg via INTRAVENOUS
  Filled 2018-07-28: qty 1

## 2018-07-28 MED ORDER — ENOXAPARIN SODIUM 40 MG/0.4ML ~~LOC~~ SOLN
40.0000 mg | Freq: Every day | SUBCUTANEOUS | Status: DC
  Administered 2018-07-28 – 2018-07-29 (×2): 40 mg via SUBCUTANEOUS
  Filled 2018-07-28 (×2): qty 0.4

## 2018-07-28 MED ORDER — ACETAMINOPHEN 10 MG/ML IV SOLN
1000.0000 mg | Freq: Once | INTRAVENOUS | Status: AC
  Administered 2018-07-28: 1000 mg via INTRAVENOUS
  Filled 2018-07-28 (×3): qty 100

## 2018-07-28 MED ORDER — AZITHROMYCIN 500 MG IV SOLR
500.0000 mg | Freq: Once | INTRAVENOUS | Status: AC
  Administered 2018-07-28: 500 mg via INTRAVENOUS
  Filled 2018-07-28: qty 500

## 2018-07-28 MED ORDER — IPRATROPIUM-ALBUTEROL 0.5-2.5 (3) MG/3ML IN SOLN
3.0000 mL | Freq: Four times a day (QID) | RESPIRATORY_TRACT | Status: DC
  Administered 2018-07-28: 3 mL via RESPIRATORY_TRACT
  Filled 2018-07-28: qty 3

## 2018-07-28 MED ORDER — IOHEXOL 300 MG/ML  SOLN
100.0000 mL | Freq: Once | INTRAMUSCULAR | Status: AC | PRN
  Administered 2018-07-28: 100 mL via INTRAVENOUS

## 2018-07-28 MED ORDER — ALBUTEROL SULFATE (2.5 MG/3ML) 0.083% IN NEBU
2.5000 mg | INHALATION_SOLUTION | RESPIRATORY_TRACT | Status: DC | PRN
  Administered 2018-07-29: 2.5 mg via RESPIRATORY_TRACT
  Filled 2018-07-28: qty 3

## 2018-07-28 MED ORDER — TRAMADOL HCL 50 MG PO TABS
50.0000 mg | ORAL_TABLET | Freq: Four times a day (QID) | ORAL | Status: AC | PRN
  Administered 2018-07-28 – 2018-07-29 (×2): 50 mg via ORAL
  Filled 2018-07-28 (×2): qty 1

## 2018-07-28 MED ORDER — KETOROLAC TROMETHAMINE 15 MG/ML IJ SOLN
15.0000 mg | Freq: Once | INTRAMUSCULAR | Status: AC
  Administered 2018-07-28: 15 mg via INTRAVENOUS
  Filled 2018-07-28: qty 1

## 2018-07-28 MED ORDER — METHYLPREDNISOLONE SODIUM SUCC 40 MG IJ SOLR
40.0000 mg | Freq: Every day | INTRAMUSCULAR | Status: DC
  Administered 2018-07-28: 40 mg via INTRAVENOUS
  Filled 2018-07-28: qty 1

## 2018-07-28 NOTE — ED Notes (Signed)
Patient transported to CT 

## 2018-07-28 NOTE — ED Notes (Signed)
Returned from CT.

## 2018-07-28 NOTE — Progress Notes (Signed)
Initial Nutrition Assessment  DOCUMENTATION CODES:   Morbid obesity  INTERVENTION:  - Continue Ensure Enlive po BID, each supplement provides 350 kcal and 20 grams of protein. - Continue to encourage PO intakes. - Weigh patient prior to d/c, per her request.    NUTRITION DIAGNOSIS:   Inadequate oral intake related to acute illness, nausea, vomiting as evidenced by per patient/family report, meal completion < 50%.  GOAL:   Patient will meet greater than or equal to 90% of their needs  MONITOR:   PO intake, Supplement acceptance, Weight trends, Labs, I & O's  REASON FOR ASSESSMENT:   Malnutrition Screening Tool  ASSESSMENT:   22 y.o. female with history of tobacco abuse and vapes. She presented to the ED at [facility] with complaints of SOB, productive cough, wheezing, and chest tightness for 2 days. The last time she vaped was 2 days ago. She also reports low back pain around the flank and was concerned about having a UTI. Patient had subjective feeling of fever and chills. Also complained of suicidal thoughts; no suicide attempts. CT angiogram also showed heterogeneous opacities concerning for airway disease. Patient admitted for acute respiratory failure with bronchitis symptoms.  No intakes documented since admission. Patient reports that she ate 50% of breakfast and consumed some Ensure but then vomited shortly after. She was feeling nauseated prior to vomiting. Patient reports that , she has had a very good appetite and that she was even eating more than usual without any issues. She would like to maintain order for Ensure at this time.   She reports having a MSG sensitivity and that when it reaches a certain threshold in her blood she will have rosy cheeks, feel weak and diaphoretic, and will often vomit. Sometimes it can lead to diarrhea, but this is infrequent. Patient states that the number of episodes per month is highly variable, dependent on the foods she  consumes, but that it has not seemed better or worse over the past 1 month.   Patient states that in December 2017 she weighed 303 lb. Patient reports that since her admission last month, she has lost 14 lb unintentionally despite eating very well and no increases in vomiting d/t MSG sensitivity. Per chart review, current weight is 252 lb and weight on 07/03/17 was 264 lb. This indicates 12 lb weight loss (4.5% body weight) in the past 13 months; this is not significant for time frame.   Medications reviewed; 125 mg Solu-medrol/day.  Labs reviewed; K: 3.4 mmol/L, Cl: 113 mmol/L, Ca: 8.6 mg/dL.      NUTRITION - FOCUSED PHYSICAL EXAM:  Completed; no muscle and no fat wasting.   Diet Order:   Diet Order            Diet regular Room service appropriate? Yes; Fluid consistency: Thin  Diet effective now              EDUCATION NEEDS:   No education needs have been identified at this time  Skin:  Skin Assessment: Reviewed RN Assessment  Last BM:  9/30  Height:   Ht Readings from Last 1 Encounters:  07/27/18 5\' 7"  (1.702 m)    Weight:   Wt Readings from Last 1 Encounters:  07/28/18 114.1 kg    Ideal Body Weight:  61.36 kg  BMI:  Body mass index is 39.41 kg/m.  Estimated Nutritional Needs:   Kcal:  1940-2170 (17-19 kcal/kg)  Protein:  110-120 grams  Fluid:  >/= 2 L/day  Jarome Matin, MS, RD, LDN, [facility] Clinical Dietitian Pager # [phone] After hours/weekend pager # [phone]

## 2018-07-28 NOTE — H&P (Signed)
History and Physical    [place] ZOX:[id number] DOB: [birth year] DOA: 07/27/2018  PCP: System, Provider Not In  Patient coming from: Home.  Chief Complaint: Shortness of breath and chest tightness.  HPI: Misty Jimenez is a 22 y.o. female with history of tobacco abuse and also uses a vape presents to the ER at [facility] with complaints of shortness of breath and chest tightness over the last 2 days.  The last time she used Vape was 2 days ago.  Denies any sick contacts or recent travel.  Has been having productive cough with wheezing last 2 days.  Also has benign low back pain around the flank and was concerned about having a UTI.  Denies any dysuria or discharges or hematuria.  Patient had subjective feeling of fever and chills.  Also complained of suicidal thoughts.  Did not have any suicidal attempts.  ED Course: In the ER patient was found to be tachycardic and a CT angiogram of the chest was negative for any large embolus but the timing was not correct.  D-dimer was negative.  Lactate was elevated.  Patient was given fluid bolus and nebulizer treatment.  On exam patient was wheezing.  On my exam patient still wheezing and has some coarse crepitations.  CT angiogram also showed heterogeneous opacities concerning for airway disease.  Patient admitted for acute respiratory failure with bronchitis symptoms.  Review of Systems: As per HPI, rest all negative.   Past Medical History:  Diagnosis Date  . Abdominal pain, chronic, right lower quadrant 08/03/2013  . Abdominal pain, recurrent    With Headache  . Acute pyelonephritis 05/07/2016   kidney surgery at 68 months of age  . ADHD (attention deficit hyperactivity disorder)   . Allergy   . Anxiety   . Depression   . Endometriosis   . Family history of adverse reaction to anesthesia    "grandmother gets PONV"  . GE reflux 12/16/2011  . Hyperlipidemia   . Kidney disease 05/07/2016  . PCOS (polycystic ovarian syndrome)    . Preventative health care 11/04/2016  . Reflux, vesicoureteral   . Wrist pain, left 11/04/2016    Past Surgical History:  Procedure Laterality Date  . KIDNEY SURGERY     As a small child  . TYMPANOSTOMY TUBE PLACEMENT    . URETER SURGERY    . WRIST SURGERY Left 05/2016   10 pins and 2 plates     reports that she has been smoking cigarettes. She has smoked for the past 0.50 years. She has quit using smokeless tobacco.  Her smokeless tobacco use included chew. She reports that she drinks alcohol. She reports that she has current or past drug history. Drug: Marijuana.  Allergies  Allergen Reactions  . Sulfa Antibiotics Rash and Hives  . Sulfasalazine Hives  . Other     Fiberglass cast caused rash and hives  . Monosodium Glutamate Nausea And Vomiting, Rash and Other (See Comments)    Other reaction(s): Other (See Comments) Headache and dizziness Headache and dizziness  . Sulfa Drugs Cross Reactors Rash    Family History  Problem Relation Age of Onset  . Kidney disease Mother   . Migraines Maternal Aunt   . Diabetes Maternal Grandfather   . Hyperlipidemia Other   . Hypertension Other   . Depression Other   . Alcohol abuse Father        and drug use, heroin, cocaine, marijuana  . Cancer Paternal Aunt  colon in 10s deceased    Prior to Admission medications   Medication Sig Start Date End Date Taking? Authorizing Provider  acetaminophen (TYLENOL) 500 MG tablet Take 500 mg by mouth every 6 (six) hours as needed for moderate pain.    [provider]  etonogestrel (NEXPLANON) 68 MG IMPL implant 1 each (68 mg total) by Subdermal route once. 11/24/14   Owens Shark, MD  ibuprofen (ADVIL,MOTRIN) 600 MG tablet Take 1 tablet (600 mg total) by mouth every 6 (six) hours as needed for headache or moderate pain. Patient not taking: Reported on 11/03/2016 05/10/16   Lora Paula, MD  ondansetron (ZOFRAN-ODT) 8 MG disintegrating tablet Take 1 tablet (8 mg total) by  mouth every 8 (eight) hours as needed for nausea or vomiting. Patient not taking: Reported on 11/03/2016 05/10/16   Lora Paula, MD  polyethylene glycol [facility]) packet Take 17 g by mouth daily as needed for mild constipation. Patient not taking: Reported on 11/03/2016 05/10/16   Lora Paula, MD  promethazine (PHENERGAN) 25 MG tablet Take 1 tablet (25 mg total) by mouth every 8 (eight) hours as needed for nausea or vomiting. Patient not taking: Reported on 07/03/2017 03/25/17   Charlestine Night, PA-C  senna (SENOKOT) 8.6 MG TABS tablet Take 2 tablets (17.2 mg total) by mouth at bedtime as needed for mild constipation. Patient not taking: Reported on 11/03/2016 05/10/16   Lora Paula, MD  traMADol (ULTRAM) 50 MG tablet Take 1 tablet (50 mg total) by mouth every 6 (six) hours as needed for severe pain. Patient not taking: Reported on 07/03/2017 03/25/17   Charlestine Night, PA-C    Physical Exam: Vitals:   07/28/18 0057 07/28/18 0235 07/28/18 0442 07/28/18 0454  BP: 114/61 116/70 122/64   Pulse: (!) 115 (!) 122 (!) 108   Resp: 15 13 16    Temp:   98 F (36.7 C)   TempSrc:   Oral   SpO2: 100% 97% 99% 98%  Weight:   114.1 kg   Height:          Constitutional: Moderately built and nourished. Vitals:   07/28/18 0057 07/28/18 0235 07/28/18 0442 07/28/18 0454  BP: 114/61 116/70 122/64   Pulse: (!) 115 (!) 122 (!) 108   Resp: 15 13 16    Temp:   98 F (36.7 C)   TempSrc:   Oral   SpO2: 100% 97% 99% 98%  Weight:   114.1 kg   Height:       Eyes: Anicteric no pallor. ENMT: No discharge from the ears eyes nose or mouth. Neck: No JVD appreciated no mass or. Respiratory: Bilateral expiratory wheezes and coarse crepitations. Cardiovascular: S1-S2 heard tachycardic. Abdomen: Soft nontender bowel sounds present. Musculoskeletal: No edema.  No joint effusion. Skin: No rash. Neurologic: Alert awake oriented to time place and person.  Moves all extremities. Psychiatric:  Suicidal thoughts.   Labs on Admission: I have personally reviewed following labs and imaging studies  CBC: Recent Labs  Lab 07/27/18 2321  WBC 20.5*  NEUTROABS 17.3*  HGB 15.1*  HCT 43.6  MCV 89.0  PLT 361   Basic Metabolic Panel: Recent Labs  Lab 07/27/18 2321  NA 140  K 3.6  CL 109  CO2 23  GLUCOSE 96  BUN 8  CREATININE 0.83  CALCIUM 9.2   GFR: Estimated Creatinine Clearance: 141 mL/min (by C-G formula based on SCr of 0.83 mg/dL). Liver Function Tests: No results for input(s): AST, ALT,  ALKPHOS, BILITOT, PROT, ALBUMIN in the last 168 hours. No results for input(s): LIPASE, AMYLASE in the last 168 hours. No results for input(s): AMMONIA in the last 168 hours. Coagulation Profile: No results for input(s): INR, PROTIME in the last 168 hours. Cardiac Enzymes: Recent Labs  Lab 07/27/18 2321  TROPONINI <0.03   BNP (last 3 results) No results for input(s): PROBNP in the last 8760 hours. HbA1C: No results for input(s): HGBA1C in the last 72 hours. CBG: No results for input(s): GLUCAP in the last 168 hours. Lipid Profile: No results for input(s): CHOL, HDL, LDLCALC, TRIG, CHOLHDL, LDLDIRECT in the last 72 hours. Thyroid Function Tests: No results for input(s): TSH, T4TOTAL, FREET4, T3FREE, THYROIDAB in the last 72 hours. Anemia Panel: No results for input(s): VITAMINB12, FOLATE, FERRITIN, TIBC, IRON, RETICCTPCT in the last 72 hours. Urine analysis:    Component Value Date/Time   COLORURINE YELLOW 07/27/2018 2233   APPEARANCEUR CLOUDY (A) 07/27/2018 2233   LABSPEC 1.015 07/27/2018 2233   PHURINE 6.5 07/27/2018 2233   GLUCOSEU NEGATIVE 07/27/2018 2233   HGBUR NEGATIVE 07/27/2018 2233   BILIRUBINUR NEGATIVE 07/27/2018 2233   KETONESUR NEGATIVE 07/27/2018 2233   PROTEINUR NEGATIVE 07/27/2018 2233   UROBILINOGEN 0.2 06/14/2013 2203   NITRITE NEGATIVE 07/27/2018 2233   LEUKOCYTESUR NEGATIVE 07/27/2018 2233   Sepsis  Labs: @LABRCNTIP (procalcitonin:4,lacticidven:4) )No results found for this or any previous visit (from the past 240 hour(s)).   Radiological Exams on Admission: Ct Angio Chest Pe W And/or Wo Contrast  Result Date: 07/28/2018 CLINICAL DATA:  Bilateral flank/chest pain for 1 week. EXAM: CT ANGIOGRAPHY CHEST WITH CONTRAST TECHNIQUE: Multidetector CT imaging of the chest was performed using the standard protocol during bolus administration of intravenous contrast. Multiplanar CT image reconstructions and MIPs were obtained to evaluate the vascular anatomy. CONTRAST:  ISOVUE-370 IOPAMIDOL (ISOVUE-370) INJECTION 76% COMPARISON:  None. FINDINGS: Cardiovascular: Limited assessment for pulmonary embolus given contrast bolus timing and soft tissue attenuation from habitus. Pulmonary arteries are evaluated to the proximal lobar level without evident filling defect. Segmental and subsegmental branches are not assessed. Thoracic aorta is normal in caliber without dissection. The heart is normal in size. No pericardial effusion. Mediastinum/Nodes: No enlarged mediastinal or hilar lymph nodes. No visualized thyroid nodule. The esophagus is decompressed. Lungs/Pleura: Minimal parenchymal heterogeneity in the upper and lower lobes of both lungs. No consolidation. No pulmonary edema. No pleural fluid. 4 mm subpleural nodule is unchanged from lung bases of abdominal CT 05/08/2016, no dedicated imaging follow-up is needed. No pulmonary mass. Trachea and mainstem bronchi are patent. Upper Abdomen: No acute findings. Musculoskeletal: There are no acute or suspicious osseous abnormalities. Review of the MIP images confirms the above findings. IMPRESSION: 1. Limited assessment for pulmonary embolus given contrast bolus timing and soft tissue attenuation from habitus, no gross central pulmonary embolus. 2. Mild heterogeneous attenuation of lung parenchyma suggesting small airways disease. Electronically Signed   By: Narda Rutherford M.D.   On: 07/28/2018 00:42    EKG: Independently reviewed.  Normal sinus rhythm.  Assessment/Plan Principal Problem:   Acute respiratory failure with hypoxia (HCC) Active Problems:   PCOS (polycystic ovarian syndrome)   Acute bronchitis   Suicidal ideations    1. Acute respiratory failure with hypoxia -on exam patient is wheezing with coarse crepitations also and CT scan shortness heterogeneous opacities.  Patient has significant leukocytosis and elevated lactate levels for which blood cultures were obtained.  I did discuss with on-call pulmonary critical care Dr. Katrinka Blazing doing that  patient has been using Vape.  At this time pulmonologist advised to treat this as bronchitis with nebulizer treatment.  I have also placed patient on Pulmicort Solu-Medrol we will check influenza PCR respiratory viral panel follow blood cultures sputum cultures urine for Legionella strep antigen and kept patient on Levaquin.  If symptoms worsen reconsult pulmonologist. 2. Suicidal thoughts -Place patient on suicide precautions sitter and consult psychiatrist. 3. Low back pain likely from respiratory issues.  UA is unremarkable.  Closely observe.  Has had surgery and 3 months old for urological issues. 4. Tobacco abuse and vape use -advised to quit these habits.   DVT prophylaxis: Lovenox. Code Status: Full code. Family Communication: Discussed with patient. Disposition Plan: Home. Consults called: Discussed with pulmonologist. Admission status: Observation.   Eduard Clos MD [facility] Pager [phone].  If 7PM-7AM, please contact night-coverage [email or web address] Password [facility]  07/28/2018, 5:43 AM

## 2018-07-28 NOTE — Progress Notes (Signed)
Patient ambulated around the department while on pulse ox.  Patient's SPO2 remained between 95% and 96% and HR was between 139 and 141.

## 2018-07-28 NOTE — ED Provider Notes (Signed)
[facility] EMERGENCY DEPARTMENT Provider Note   CSN: [id number] Arrival date & time: 07/27/18  2219     History   Chief Complaint Chief Complaint  Patient presents with  . Back Pain    HPI Misty Jimenez is a 22 y.o. female.  The history is provided by the patient.  Back Pain   This is a new problem. The current episode started more than 1 week ago. The problem occurs constantly. The problem has not changed since onset.The pain is associated with falling. The pain is present in the thoracic spine. The pain is severe. The pain is the same all the time. Associated symptoms include chest pain. Pertinent negatives include no fever, no numbness, no weight loss, no headaches, no abdominal pain, no abdominal swelling, no bowel incontinence, no perianal numbness, no bladder incontinence, no dysuria, no pelvic pain, no leg pain, no paresthesias, no paresis, no tingling and no weakness. She has tried nothing for the symptoms. The treatment provided no relief. Risk factors include obesity.  Has nausea and vomiting with symptoms.  Pain is B.  No dysuria, no frequency, no hematuria.  Points to posterior lungs as the location of pain.  No f/c/r.    Past Medical History:  Diagnosis Date  . Abdominal pain, chronic, right lower quadrant 08/03/2013  . Abdominal pain, recurrent    With Headache  . Acute pyelonephritis 05/07/2016   kidney surgery at 68 months of age  . ADHD (attention deficit hyperactivity disorder)   . Allergy   . Anxiety   . Depression   . Endometriosis   . Family history of adverse reaction to anesthesia    "grandmother gets PONV"  . GE reflux 12/16/2011  . Hyperlipidemia   . Kidney disease 05/07/2016  . PCOS (polycystic ovarian syndrome)   . Preventative health care 11/04/2016  . Reflux, vesicoureteral   . Wrist pain, left 11/04/2016    Patient Active Problem List   Diagnosis Date Noted  . Wrist pain, left 11/04/2016  . Preventative health care 11/04/2016  .  Hyperlipidemia   . Obesity 05/07/2016  . Endometriosis 05/07/2016  . Kidney disease 05/07/2016  . PCOS (polycystic ovarian syndrome) 08/29/2013  . ADHD (attention deficit hyperactivity disorder) 08/03/2013  . 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 KIDNEY SURGERY     As a small child  . TYMPANOSTOMY TUBE PLACEMENT    . URETER SURGERY    . WRIST SURGERY Left 05/2016   10 pins and 2 plates     OB History    Gravida  0   Para  0   Term  0   Preterm  0   AB  0   Living  0     SAB  0   TAB  0   Ectopic  0   Multiple  0   Live Births  0            Home Medications    Prior to Admission medications   Medication Sig Start Date End Date Taking? Authorizing Provider  acetaminophen (TYLENOL) 500 MG tablet Take 500 mg by mouth every 6 (six) hours as needed for moderate pain.    [provider]  etonogestrel (NEXPLANON) 68 MG IMPL implant 1 each (68 mg total) by Subdermal route once. 11/24/14   Owens Shark, MD  ibuprofen (ADVIL,MOTRIN) 600 MG tablet Take 1 tablet (600 mg total) by  mouth every 6 (six) hours as needed for headache or moderate pain. Patient not taking: Reported on 11/03/2016 05/10/16   Lora Paula, MD  ondansetron (ZOFRAN-ODT) 8 MG disintegrating tablet Take 1 tablet (8 mg total) by mouth every 8 (eight) hours as needed for nausea or vomiting. Patient not taking: Reported on 11/03/2016 05/10/16   Lora Paula, MD  polyethylene glycol [facility] Ethelene Hal) packet Take 17 g by mouth daily as needed for mild constipation. Patient not taking: Reported on 11/03/2016 05/10/16   Lora Paula, MD  promethazine (PHENERGAN) 25 MG tablet Take 1 tablet (25 mg total) by mouth every 8 (eight) hours as needed for nausea or vomiting. Patient not taking: Reported on 07/03/2017 03/25/17   Charlestine Night, PA-C  senna (SENOKOT) 8.6 MG TABS tablet Take 2 tablets (17.2 mg  total) by mouth at bedtime as needed for mild constipation. Patient not taking: Reported on 11/03/2016 05/10/16   Lora Paula, MD  traMADol (ULTRAM) 50 MG tablet Take 1 tablet (50 mg total) by mouth every 6 (six) hours as needed for severe pain. Patient not taking: Reported on 07/03/2017 03/25/17   Charlestine Night, PA-C    Family History Family History  Problem Relation Age of Onset  . Kidney disease Mother   . Migraines Maternal Aunt   . Diabetes Maternal Grandfather   . Hyperlipidemia Other   . Hypertension Other   . Depression Other   . Alcohol abuse Father        and drug use, heroin, cocaine, marijuana  . Cancer Paternal Aunt        colon in 59s deceased    Social History Social History   Tobacco Use  . Smoking status: Current Every Day Smoker    Years: 0.50    Types: Cigarettes    Last attempt to quit: 10/28/2015    Years since quitting: 2.7  . Smokeless tobacco: Former Neurosurgeon    Types: Chew  Substance Use Topics  . Alcohol use: Yes    Comment: occ  . Drug use: Yes    Types: Marijuana     Allergies   Sulfa antibiotics; Sulfasalazine; Other; Monosodium glutamate; and Sulfa drugs cross reactors   Review of Systems Review of Systems  Constitutional: Negative for fever and weight loss.  Respiratory: Positive for chest tightness.   Cardiovascular: Positive for chest pain.  Gastrointestinal: Negative for abdominal pain, bowel incontinence, nausea and vomiting.  Genitourinary: Negative for bladder incontinence, dysuria and pelvic pain.  Musculoskeletal: Positive for back pain.  Neurological: Negative for tingling, weakness, numbness, headaches and paresthesias.  All other systems reviewed and are negative.    Physical Exam Updated Vital Signs BP 114/61   Pulse (!) 115   Temp 99.3 F (37.4 C) (Oral)   Resp 15   Ht 5\' 7"  (1.702 m)   Wt 112 kg   SpO2 100%   BMI 38.69 kg/m   Physical Exam  Constitutional: She is oriented to person, place, and time.  She appears well-developed and well-nourished.  HENT:  Head: Normocephalic and atraumatic.  Nose: Nose normal.  Mouth/Throat: No oropharyngeal exudate.  Eyes: Pupils are equal, round, and reactive to light. Conjunctivae are normal.  Neck: Normal range of motion. Neck supple. No tracheal deviation present.  Cardiovascular: Regular rhythm, normal heart sounds and intact distal pulses. Tachycardia present.  Pulmonary/Chest: No stridor. Tachypnea noted. She has wheezes.  Abdominal: Soft. Bowel sounds are normal. She exhibits no mass. There is no tenderness. There is  no rebound and no guarding.  Musculoskeletal: Normal range of motion.  Neurological: She is alert and oriented to person, place, and time. She displays normal reflexes.  Skin: Skin is warm. Capillary refill takes less than 2 seconds. She is not diaphoretic.  Psychiatric: She has a normal mood and affect.     ED Treatments / Results  Labs (all labs ordered are listed, but only abnormal results are displayed) Results for orders placed or performed during the hospital encounter of 07/27/18  Pregnancy, urine  Result Value Ref Range   Preg Test, Ur NEGATIVE NEGATIVE  Urinalysis, Routine w reflex microscopic  Result Value Ref Range   Color, Urine YELLOW YELLOW   APPearance CLOUDY (A) CLEAR   Specific Gravity, Urine 1.015 1.005 - 1.030   pH 6.5 5.0 - 8.0   Glucose, UA NEGATIVE NEGATIVE mg/dL   Hgb urine dipstick NEGATIVE NEGATIVE   Bilirubin Urine NEGATIVE NEGATIVE   Ketones, ur NEGATIVE NEGATIVE mg/dL   Protein, ur NEGATIVE NEGATIVE mg/dL   Nitrite NEGATIVE NEGATIVE   Leukocytes, UA NEGATIVE NEGATIVE  CBC with Differential/Platelet  Result Value Ref Range   WBC 20.5 (H) 4.0 - 10.5 K/uL   RBC 4.90 3.87 - 5.11 MIL/uL   Hemoglobin 15.1 (H) 12.0 - 15.0 g/dL   HCT 16.1 09.6 - 04.5 %   MCV 89.0 78.0 - 100.0 fL   MCH 30.8 26.0 - 34.0 pg   MCHC 34.6 30.0 - 36.0 g/dL   RDW 40.9 81.1 - 91.4 %   Platelets 361 150 - 400 K/uL    Neutrophils Relative % 84 %   Neutro Abs 17.3 (H) 1.7 - 7.7 K/uL   Lymphocytes Relative 9 %   Lymphs Abs 1.8 0.7 - 4.0 K/uL   Monocytes Relative 6 %   Monocytes Absolute 1.2 (H) 0.1 - 1.0 K/uL   Eosinophils Relative 1 %   Eosinophils Absolute 0.2 0.0 - 0.7 K/uL   Basophils Relative 0 %   Basophils Absolute 0.0 0.0 - 0.1 K/uL  Basic metabolic panel  Result Value Ref Range   Sodium 140 135 - 145 mmol/L   Potassium 3.6 3.5 - 5.1 mmol/L   Chloride 109 98 - 111 mmol/L   CO2 23 22 - 32 mmol/L   Glucose, Bld 96 70 - 99 mg/dL   BUN 8 6 - 20 mg/dL   Creatinine, Ser 7.82 0.44 - 1.00 mg/dL   Calcium 9.2 8.9 - 95.6 mg/dL   GFR calc non Af Amer >60 >60 mL/min   GFR calc Af Amer >60 >60 mL/min   Anion gap 8 5 - 15  Troponin I  Result Value Ref Range   Troponin I <0.03 <0.03 ng/mL  D-dimer, quantitative (not at [facility])  Result Value Ref Range   D-Dimer, Quant <0.27 0.00 - 0.50 ug/mL-FEU   Ct Angio Chest Pe W And/or Wo Contrast  Result Date: 07/28/2018 CLINICAL DATA:  Bilateral flank/chest pain for 1 week. EXAM: CT ANGIOGRAPHY CHEST WITH CONTRAST TECHNIQUE: Multidetector CT imaging of the chest was performed using the standard protocol during bolus administration of intravenous contrast. Multiplanar CT image reconstructions and MIPs were obtained to evaluate the vascular anatomy. CONTRAST:  ISOVUE-370 IOPAMIDOL (ISOVUE-370) INJECTION 76% COMPARISON:  None. FINDINGS: Cardiovascular: Limited assessment for pulmonary embolus given contrast bolus timing and soft tissue attenuation from habitus. Pulmonary arteries are evaluated to the proximal lobar level without evident filling defect. Segmental and subsegmental branches are not assessed. Thoracic aorta is normal in caliber without dissection.  The heart is normal in size. No pericardial effusion. Mediastinum/Nodes: No enlarged mediastinal or hilar lymph nodes. No visualized thyroid nodule. The esophagus is decompressed. Lungs/Pleura: Minimal  parenchymal heterogeneity in the upper and lower lobes of both lungs. No consolidation. No pulmonary edema. No pleural fluid. 4 mm subpleural nodule is unchanged from lung bases of abdominal CT 05/08/2016, no dedicated imaging follow-up is needed. No pulmonary mass. Trachea and mainstem bronchi are patent. Upper Abdomen: No acute findings. Musculoskeletal: There are no acute or suspicious osseous abnormalities. Review of the MIP images confirms the above findings. IMPRESSION: 1. Limited assessment for pulmonary embolus given contrast bolus timing and soft tissue attenuation from habitus, no gross central pulmonary embolus. 2. Mild heterogeneous attenuation of lung parenchyma suggesting small airways disease. Electronically Signed   By: Narda Rutherford M.D.   On: 07/28/2018 00:42    EKG EKG Interpretation  Date/Time:  Tuesday July 27 2018 23:17:08 EDT Ventricular Rate:  89 PR Interval:    QRS Duration: 79 QT Interval:  345 QTC Calculation: 420 R Axis:   48 Text Interpretation:  Sinus rhythm Confirmed by Rula Keniston ([id number]) on 07/27/2018 11:29:54 PM   Radiology Ct Angio Chest Pe W And/or Wo Contrast  Result Date: 07/28/2018 CLINICAL DATA:  Bilateral flank/chest pain for 1 week. EXAM: CT ANGIOGRAPHY CHEST WITH CONTRAST TECHNIQUE: Multidetector CT imaging of the chest was performed using the standard protocol during bolus administration of intravenous contrast. Multiplanar CT image reconstructions and MIPs were obtained to evaluate the vascular anatomy. CONTRAST:  ISOVUE-370 IOPAMIDOL (ISOVUE-370) INJECTION 76% COMPARISON:  None. FINDINGS: Cardiovascular: Limited assessment for pulmonary embolus given contrast bolus timing and soft tissue attenuation from habitus. Pulmonary arteries are evaluated to the proximal lobar level without evident filling defect. Segmental and subsegmental branches are not assessed. Thoracic aorta is normal in caliber without dissection. The heart is normal in  size. No pericardial effusion. Mediastinum/Nodes: No enlarged mediastinal or hilar lymph nodes. No visualized thyroid nodule. The esophagus is decompressed. Lungs/Pleura: Minimal parenchymal heterogeneity in the upper and lower lobes of both lungs. No consolidation. No pulmonary edema. No pleural fluid. 4 mm subpleural nodule is unchanged from lung bases of abdominal CT 05/08/2016, no dedicated imaging follow-up is needed. No pulmonary mass. Trachea and mainstem bronchi are patent. Upper Abdomen: No acute findings. Musculoskeletal: There are no acute or suspicious osseous abnormalities. Review of the MIP images confirms the above findings. IMPRESSION: 1. Limited assessment for pulmonary embolus given contrast bolus timing and soft tissue attenuation from habitus, no gross central pulmonary embolus. 2. Mild heterogeneous attenuation of lung parenchyma suggesting small airways disease. Electronically Signed   By: Narda Rutherford M.D.   On: 07/28/2018 00:42    Procedures Procedures (including critical care time)  Medications Ordered in ED Medications  ipratropium-albuterol (DUONEB) 0.5-2.5 (3) MG/3ML nebulizer solution 3 mL (3 mLs Nebulization Given 07/27/18 2327)  cefTRIAXone (ROCEPHIN) 1 g in sodium chloride 0.9 % 100 mL IVPB (has no administration in time range)  azithromycin (ZITHROMAX) 500 mg in sodium chloride 0.9 % 250 mL IVPB (has no administration in time range)  sodium chloride 0.9 % bolus 1,000 mL (has no administration in time range)  azithromycin (ZITHROMAX) 500 MG injection (has no administration in time range)  ondansetron (ZOFRAN) injection 4 mg (4 mg Intravenous Given 07/28/18 0000)  iopamidol (ISOVUE-370) 76 % injection 100 mL (100 mLs Intravenous Contrast Given 07/28/18 0016)  albuterol (PROVENTIL) (2.5 MG/3ML) 0.083% nebulizer solution 5 mg (5 mg Nebulization Given 07/28/18 0050)  methylPREDNISolone sodium succinate (SOLU-MEDROL) 125 mg/2 mL injection 125 mg (125 mg Intravenous Given  07/28/18 0056)  sodium chloride 0.9 % bolus 1,000 mL ( Intravenous Stopped 07/28/18 0219)     Final Clinical Impressions(s) / ED Diagnoses   Will admit for wheezing, persistent tachycardia and leukocytosis.      Shoichi Mielke, MD 07/28/18 [phone]

## 2018-07-28 NOTE — Progress Notes (Signed)
CRITICAL VALUE ALERT  Critical Value:  Lactic acid 3.2  Date & Time Notied:  07/28/2018 1000  Provider Notified: Edward Jolly  Orders Received/Actions taken: awaiting response

## 2018-07-28 NOTE — ED Notes (Signed)
Report to [facility] with [facility]

## 2018-07-28 NOTE — Progress Notes (Signed)
TRH   Patient admitted after midnight see H&P for further details.  Patient seen, chart review and plan discussed with patient and family.   22 year old female with no significant past medical history presented to the emergency department complaining of chest tightness and shortness of breath.  Patient admits to use tobacco and vape frequently.  Patient was admitted with working diagnosis of acute respiratory failure with bronchitis.  Patient currently with no significantly improved of her symptoms.  Continues to have chest tightness, back pain and difficulty breathing.  On exam patient is wheezing throughout and sound coarse breath sounds.  Back with paraspinal tenderness.  Patient currently being treated for bronchitis with Levaquin, Solu-Medrol and nebulizer treatments.  We will add Brovana, increase Solu-Medrol to 40 mg IV twice daily and continue antibiotics for now.  Will check procalcitonin as discussed be viral in origin.  If flank pain worsen we will consider CT abdomen and pelvis, UA was normal so doubt pyelonephritis.  Latrelle Dodrill, MD

## 2018-07-28 NOTE — ED Notes (Signed)
Pt transferred to [facility] via [facility]. 

## 2018-07-28 NOTE — Plan of Care (Signed)
All of pt's questions were answered satisfactorily.

## 2018-07-29 DIAGNOSIS — F419 Anxiety disorder, unspecified: Secondary | ICD-10-CM

## 2018-07-29 DIAGNOSIS — F332 Major depressive disorder, recurrent severe without psychotic features: Secondary | ICD-10-CM

## 2018-07-29 DIAGNOSIS — G4700 Insomnia, unspecified: Secondary | ICD-10-CM

## 2018-07-29 DIAGNOSIS — R45851 Suicidal ideations: Secondary | ICD-10-CM

## 2018-07-29 DIAGNOSIS — J206 Acute bronchitis due to rhinovirus: Principal | ICD-10-CM

## 2018-07-29 LAB — BASIC METABOLIC PANEL
Anion gap: 9 (ref 5–15)
BUN: 9 mg/dL (ref 6–20)
CALCIUM: 9.7 mg/dL (ref 8.9–10.3)
CO2: 22 mmol/L (ref 22–32)
CREATININE: 0.72 mg/dL (ref 0.44–1.00)
Chloride: 114 mmol/L — ABNORMAL HIGH (ref 98–111)
GLUCOSE: 113 mg/dL — AB (ref 70–99)
Potassium: 4 mmol/L (ref 3.5–5.1)
Sodium: 145 mmol/L (ref 135–145)

## 2018-07-29 LAB — CBC WITH DIFFERENTIAL/PLATELET
BASOS ABS: 0 10*3/uL (ref 0.0–0.1)
BASOS PCT: 0 %
EOS ABS: 0 10*3/uL (ref 0.0–0.7)
EOS PCT: 0 %
HCT: 36.9 % (ref 36.0–46.0)
HEMOGLOBIN: 12.3 g/dL (ref 12.0–15.0)
Lymphocytes Relative: 6 %
Lymphs Abs: 1.2 10*3/uL (ref 0.7–4.0)
MCH: 30.7 pg (ref 26.0–34.0)
MCHC: 33.3 g/dL (ref 30.0–36.0)
MCV: 92 fL (ref 78.0–100.0)
Monocytes Absolute: 1.1 10*3/uL — ABNORMAL HIGH (ref 0.1–1.0)
Monocytes Relative: 5 %
NEUTROS PCT: 89 %
Neutro Abs: 19.1 10*3/uL — ABNORMAL HIGH (ref 1.7–7.7)
Platelets: 370 10*3/uL (ref 150–400)
RBC: 4.01 MIL/uL (ref 3.87–5.11)
RDW: 13.1 % (ref 11.5–15.5)
WBC: 21.4 10*3/uL — AB (ref 4.0–10.5)

## 2018-07-29 LAB — EXPECTORATED SPUTUM ASSESSMENT W REFEX TO RESP CULTURE

## 2018-07-29 LAB — CBC
HCT: 39.5 % (ref 36.0–46.0)
Hemoglobin: 13.1 g/dL (ref 12.0–15.0)
MCH: 30.5 pg (ref 26.0–34.0)
MCHC: 33.2 g/dL (ref 30.0–36.0)
MCV: 91.9 fL (ref 78.0–100.0)
PLATELETS: 409 10*3/uL — AB (ref 150–400)
RBC: 4.3 MIL/uL (ref 3.87–5.11)
RDW: 13.4 % (ref 11.5–15.5)
WBC: 26.3 10*3/uL — ABNORMAL HIGH (ref 4.0–10.5)

## 2018-07-29 LAB — LEGIONELLA PNEUMOPHILA SEROGP 1 UR AG: L. PNEUMOPHILA SEROGP 1 UR AG: NEGATIVE

## 2018-07-29 LAB — EXPECTORATED SPUTUM ASSESSMENT W GRAM STAIN, RFLX TO RESP C

## 2018-07-29 MED ORDER — HYDROXYZINE HCL 25 MG PO TABS: 25.0000 mg | ORAL_TABLET | Freq: Four times a day (QID) | ORAL | Status: DC | PRN

## 2018-07-29 MED ORDER — ESCITALOPRAM OXALATE 10 MG PO TABS: 10.0000 mg | ORAL_TABLET | Freq: Every day | ORAL | 0 refills | Status: DC

## 2018-07-29 MED ORDER — PREDNISONE 20 MG PO TABS
40.0000 mg | ORAL_TABLET | Freq: Every day | ORAL | Status: DC
  Administered 2018-07-29: 40 mg via ORAL
  Filled 2018-07-29 (×2): qty 2

## 2018-07-29 MED ORDER — HYDROCOD POLST-CPM POLST ER 10-8 MG/5ML PO SUER: 5.0000 mL | Freq: Two times a day (BID) | ORAL | 0 refills | Status: DC | PRN

## 2018-07-29 MED ORDER — SODIUM CHLORIDE 0.9 % IV BOLUS
1000.0000 mL | Freq: Once | INTRAVENOUS | Status: AC
  Administered 2018-07-29: 1000 mL via INTRAVENOUS

## 2018-07-29 MED ORDER — ALBUTEROL SULFATE HFA 108 (90 BASE) MCG/ACT IN AERS: 2.0000 | INHALATION_SPRAY | Freq: Four times a day (QID) | RESPIRATORY_TRACT | 0 refills | Status: DC | PRN

## 2018-07-29 MED ORDER — HYDROCODONE-HOMATROPINE 5-1.5 MG/5ML PO SYRP: 5.0000 mL | ORAL_SOLUTION | ORAL | Status: DC | PRN

## 2018-07-29 MED ORDER — PREDNISONE 20 MG PO TABS: 40.0000 mg | ORAL_TABLET | Freq: Every day | ORAL | 0 refills | Status: AC

## 2018-07-29 MED ORDER — TRAZODONE HCL 50 MG PO TABS: 50.0000 mg | ORAL_TABLET | Freq: Every day | ORAL | 0 refills | Status: DC

## 2018-07-29 MED ORDER — IBUPROFEN 600 MG PO TABS: 600.0000 mg | ORAL_TABLET | Freq: Four times a day (QID) | ORAL | 0 refills | Status: DC | PRN

## 2018-07-29 MED ORDER — ONDANSETRON 8 MG PO TBDP: 8.0000 mg | ORAL_TABLET | Freq: Three times a day (TID) | ORAL | 0 refills | Status: DC | PRN

## 2018-07-29 MED ORDER — IBUPROFEN 200 MG PO TABS
600.0000 mg | ORAL_TABLET | Freq: Three times a day (TID) | ORAL | Status: DC
  Administered 2018-07-29 (×2): 600 mg via ORAL
  Filled 2018-07-29 (×2): qty 3

## 2018-07-29 MED ORDER — ONDANSETRON 4 MG PO TBDP
8.0000 mg | ORAL_TABLET | Freq: Once | ORAL | Status: AC
  Administered 2018-07-29: 8 mg via ORAL
  Filled 2018-07-29: qty 2

## 2018-07-29 NOTE — Clinical Social Work Note (Signed)
Clinical Social Work Assessment  Patient Details  Name: Misty Jimenez MRN: [id number] Date of Birth: [birth year]  Date of referral:  07/29/18               Reason for consult:  Mental Health Concerns                Permission sought to share information with:    Permission granted to share information::     Name::        Agency::     Relationship::     Contact Information:     Housing/Transportation Living arrangements for the past 2 months:  Single Family Home Source of Information:  Patient Patient Interpreter Needed:  None Criminal Activity/Legal Involvement Pertinent to Current Situation/Hospitalization:  No - Comment as needed Significant Relationships:  Friend Lives with:  Friends Do you feel safe going back to the place where you live?  Yes Need for family participation in patient care:  No (Coment)  Care giving concerns:  Patient reported no care giving concerns. CSW consulted for "psychiatry support and references".    Social Worker assessment / plan:  CSW spoke with patient at bedside, patient accompanied by 2 friends reported that they are okay to stay in the room. CSW and patient discussed psychiatrist recommendation to follow up with outpatient mental health resources. Patient disclosed that she has been to therapy in the past when she was younger and is agreeable to going now. CSW provided patient with local mental health resources, patient accepted and agreed to follow up. CSW inquired if patient needed any additional resources, patient declined resources.   Employment status:  Unemployed Health and safety inspector:  Managed Care PT Recommendations:  Not assessed at this time Information / Referral to community resources:  Outpatient Psychiatric Care (Comment Required)  Patient/Family's Response to care:  Patient appreciative of CSW visit and resources.   Patient/Family's Understanding of and Emotional Response to Diagnosis, Current Treatment, and Prognosis:  Patient  presented calm and verbalized understanding of current treatment plan. Patient accepted local mental health resources and agreed to follow up. Patient denied any barriers to following up with local mental health resources.  Emotional Assessment Appearance:  Appears stated age Attitude/Demeanor/Rapport:  Other(Cooperative) Affect (typically observed):  Appropriate Orientation:  Oriented to Self, Oriented to Situation, Oriented to Place, Oriented to  Time Alcohol / Substance use:  Not Applicable Psych involvement (Current and /or in the community):  Yes (Comment)(Psychiatry signed off and recommended outpatient psychiatry follow up)  Discharge Needs  Concerns to be addressed:  No discharge needs identified Readmission within the last 30 days:  No Current discharge risk:  None Barriers to Discharge:  No Barriers Identified   Antionette Poles, LCSW 07/29/2018, 4:25 PM

## 2018-07-29 NOTE — Plan of Care (Signed)
  Problem: Activity: Goal: Risk for activity intolerance will decrease Outcome: Progressing   Problem: Pain Managment: Goal: General experience of comfort will improve Outcome: Progressing   Problem: Safety: Goal: Ability to remain free from injury will improve Outcome: Progressing   

## 2018-07-29 NOTE — Progress Notes (Signed)
07/29/18  1600  Reviewed discharge instructions with patient. Patient verbalized understanding of discharge instructions. Copy of discharge instructions and prescriptions given to patient.

## 2018-07-29 NOTE — Progress Notes (Signed)
07/29/18  1615  SW came and gave patient resources.

## 2018-07-29 NOTE — Discharge Summary (Signed)
Physician Discharge Summary  Misty Jimenez  MWU:[id number]  DOB: [birth year]  DOA: 07/27/2018 PCP: System, Provider Not In  Admit date: 07/27/2018 Discharge date: 07/29/2018  Admitted From: Home  Disposition: Home   Recommendations for Outpatient Follow-up:  1. Follow up with PCP in 1 week  2. Please obtain BMP/CBC in one week to monitor WBC and renal function  3. Psych follow up   Discharge Condition: Stable  CODE STATUS: Full Code  Diet recommendation: Regular   Brief/Interim Summary: For full details see H&P/Progress note, but in brief, Misty Jimenez is a 22 year old female with no significant past medical history presented to the emergency department complaining of chest tightness and shortness of breath.  Patient admits to use tobacco and vape frequently.  Patient was admitted with working diagnosis of acute respiratory failure with bronchitis.  Viral respiratory panel was positive for rhinovirus.  Patient was treated with nebulizers, steroids and antibiotics.  After viral panel was positive and antibiotics were discontinued.  Patient breathing subsequently improved.  While in the ED patient reported suicidal idea, therefore psychiatry was consulted.  Psychiatry evaluation recommended outpatient follow-up and no current risk to harming self. Patient was started on Lexapro and Trazodone. Patient was deemed stable for discharge.   Subjective: Patient seen and examined, she has no complaint this morning.  Her breathing is okay.  No acute events overnight.  Remains afebrile.  Discharge Diagnoses/Hospital Course:  Upper respiratory infection due to rhinovirus Patient with classic viral prodrome.  Vaping and smoking contributing.  Symptomatic treatment, hydration, ibuprofen 600 mg as needed, albuterol every 4 hour as needed for shortness of breath, Tussionex every 12 hour as needed, prednisone 40 mg x 3 days.  Follow-up with PCP. Procalcitonin negative. Leukocytosis due to viral  process and steroid (demargination). Repeat CBC in 1 week   Suicidal thoughts/depression Psychiatry was consulted and recommended outpatient follow-up.  Start Lexapro and trazodone.  Tobacco abuse Cessation discussed, patient report she will be quitting after these episodes.  On the day of the discharge the patient's vitals were stable, and no other acute medical condition were reported by patient. the patient was felt safe to be discharge to home.   Discharge Instructions  You were cared for by a hospitalist during your hospital stay. If you have any questions about your discharge medications or the care you received while you were in the hospital after you are discharged, you can call the unit and asked to speak with the hospitalist on call if the hospitalist that took care of you is not available. Once you are discharged, your primary care physician will handle any further medical issues. Please note that NO REFILLS for any discharge medications will be authorized once you are discharged, as it is imperative that you return to your primary care physician (or establish a relationship with a primary care physician if you do not have one) for your aftercare needs so that they can reassess your need for medications and monitor your lab values.  Discharge Instructions    Call MD for:  difficulty breathing, headache or visual disturbances   Complete by:  As directed    Call MD for:  extreme fatigue   Complete by:  As directed    Call MD for:  hives   Complete by:  As directed    Call MD for:  persistant dizziness or light-headedness   Complete by:  As directed    Call MD for:  persistant nausea and vomiting   Complete  by:  As directed    Call MD for:  redness, tenderness, or signs of infection (pain, swelling, redness, odor or green/yellow discharge around incision site)   Complete by:  As directed    Call MD for:  severe uncontrolled pain   Complete by:  As directed    Call MD for:   temperature >100.4   Complete by:  As directed    Diet - low sodium heart healthy   Complete by:  As directed    Discharge instructions   Complete by:  As directed    Keep self well hydrated, follow up with primary care doctor within 1 week   Increase activity slowly   Complete by:  As directed      Allergies as of 07/29/2018      Reactions   Sulfa Antibiotics Rash, Hives   Sulfasalazine Hives   Other    Fiberglass cast caused rash and hives   Monosodium Glutamate Nausea And Vomiting, Rash, Other (See Comments)   Other reaction(s): Other (See Comments) Headache and dizziness Headache and dizziness   Sulfa Drugs Cross Reactors Rash      Medication List    STOP taking these medications   polyethylene glycol packet Commonly known as:  MIRALAX / GLYCOLAX   promethazine 25 MG tablet Commonly known as:  PHENERGAN   senna 8.6 MG Tabs tablet Commonly known as:  SENOKOT   traMADol 50 MG tablet Commonly known as:  ULTRAM     TAKE these medications   acetaminophen 500 MG tablet Commonly known as:  TYLENOL Take 500 mg by mouth every 6 (six) hours as needed for moderate pain.   albuterol 108 (90 Base) MCG/ACT inhaler Commonly known as:  PROVENTIL HFA;VENTOLIN HFA Inhale 2 puffs into the lungs every 6 (six) hours as needed for wheezing or shortness of breath.   chlorpheniramine-HYDROcodone 10-8 MG/5ML Suer Commonly known as:  TUSSIONEX Take 5 mLs by mouth every 12 (twelve) hours as needed for cough.   escitalopram 10 MG tablet Commonly known as:  LEXAPRO Take 1 tablet (10 mg total) by mouth daily.   ibuprofen 600 MG tablet Commonly known as:  ADVIL,MOTRIN Take 1 tablet (600 mg total) by mouth every 6 (six) hours as needed for headache or moderate pain.   ondansetron 8 MG disintegrating tablet Commonly known as:  ZOFRAN-ODT Take 1 tablet (8 mg total) by mouth every 8 (eight) hours as needed for nausea or vomiting.   predniSONE 20 MG tablet Commonly known as:   DELTASONE Take 2 tablets (40 mg total) by mouth daily with breakfast for 3 days. Start taking on:  07/30/2018   traZODone 50 MG tablet Commonly known as:  DESYREL Take 1 tablet (50 mg total) by mouth at bedtime.      Follow-up Information    [facility]. Schedule an appointment as soon as possible for a visit in 1 week(s).   Why:  Establish care, hospital follow up  Contact information: [address] [place] [address] [phone]         Allergies  Allergen Reactions  . Sulfa Antibiotics Rash and Hives  . Sulfasalazine Hives  . Other     Fiberglass cast caused rash and hives  . Monosodium Glutamate Nausea And Vomiting, Rash and Other (See Comments)    Other reaction(s): Other (See Comments) Headache and dizziness Headache and dizziness  . Sulfa Drugs Cross Reactors Rash    Consultations:  Psych  Procedures/Studies: Ct Angio Chest Pe W And/or Wo Contrast  Result Date: 07/28/2018 CLINICAL DATA:  Bilateral flank/chest pain for 1 week. EXAM: CT ANGIOGRAPHY CHEST WITH CONTRAST TECHNIQUE: Multidetector CT imaging of the chest was performed using the standard protocol during bolus administration of intravenous contrast. Multiplanar CT image reconstructions and MIPs were obtained to evaluate the vascular anatomy. CONTRAST:  ISOVUE-370 IOPAMIDOL (ISOVUE-370) INJECTION 76% COMPARISON:  None. FINDINGS: Cardiovascular: Limited assessment for pulmonary embolus given contrast bolus timing and soft tissue attenuation from habitus. Pulmonary arteries are evaluated to the proximal lobar level without evident filling defect. Segmental and subsegmental branches are not assessed. Thoracic aorta is normal in caliber without dissection. The heart is normal in size. No pericardial effusion. Mediastinum/Nodes: No enlarged mediastinal or hilar lymph nodes. No visualized thyroid nodule. The esophagus is decompressed. Lungs/Pleura: Minimal  parenchymal heterogeneity in the upper and lower lobes of both lungs. No consolidation. No pulmonary edema. No pleural fluid. 4 mm subpleural nodule is unchanged from lung bases of abdominal CT 05/08/2016, no dedicated imaging follow-up is needed. No pulmonary mass. Trachea and mainstem bronchi are patent. Upper Abdomen: No acute findings. Musculoskeletal: There are no acute or suspicious osseous abnormalities. Review of the MIP images confirms the above findings. IMPRESSION: 1. Limited assessment for pulmonary embolus given contrast bolus timing and soft tissue attenuation from habitus, no gross central pulmonary embolus. 2. Mild heterogeneous attenuation of lung parenchyma suggesting small airways disease. Electronically Signed   By: Narda Rutherford M.D.   On: 07/28/2018 00:42   Ct Abdomen Pelvis W Contrast  Result Date: 07/28/2018 CLINICAL DATA:  Back and flank pain for 1 week.  Fever. EXAM: CT ABDOMEN AND PELVIS WITH CONTRAST TECHNIQUE: Multidetector CT imaging of the abdomen and pelvis was performed using the standard protocol following bolus administration of intravenous contrast. CONTRAST:  OMNIPAQUE IOHEXOL 300 MG/ML  SOLN COMPARISON:  Mar 25, 2017 FINDINGS: Lower chest: There is a nodule in the left base measuring 6 by 3 by 3 mm with a mean dimension 4 mm. This nodule measures 3 mm in greatest dimension and July 2017 was similar on coronal images at that time. The difference could be due to difference in slice selection. Recommend attention on follow-up. A somewhat nodular density in the right base on series 4, image 43 measures up to 3.5 mm today versus 3.5 mm in May of 2018. No other nodules or masses. No other acute abnormalities in the lung bases/lower chest. Hepatobiliary: No focal liver abnormality is seen. No gallstones, gallbladder wall thickening, or biliary dilatation. Pancreas: Unremarkable. No pancreatic ductal dilatation or surrounding inflammatory changes. Spleen: Normal in size  without focal abnormality. Adrenals/Urinary Tract: Adrenal glands are unremarkable. Kidneys are normal, without renal calculi, focal lesion, or hydronephrosis. Bladder is unremarkable. Stomach/Bowel: The stomach and small bowel are normal. The colon and appendix are normal. Vascular/Lymphatic: No significant vascular findings are present. No enlarged abdominal or pelvic lymph nodes. Reproductive: Uterus and bilateral adnexa are unremarkable. Other: No abdominal wall hernia or abnormality. No abdominopelvic ascites. Musculoskeletal: No acute or significant osseous findings. IMPRESSION: 1. There is a nodule within the mean measurement of 4 mm in the left base and a small 3 mm nodule in the right base. The left basilar nodules more prominent compared to July of 2017 on axial images but the difference could be due to difference in slice selection. No follow-up needed if patient is low-risk. Non-contrast chest CT can be considered in 12 months if patient is high-risk. This  recommendation follows the consensus statement: Guidelines for Management of Incidental Pulmonary Nodules Detected on CT Images: From the Fleischner Society 2017; Radiology 2017; 284:228-243. 2. No other abnormalities identified. No cause for the patient's symptoms noted. Electronically Signed   By: Gerome Sam III M.D   On: 07/28/2018 18:17    Discharge Exam: Vitals:   07/29/18 1137 07/29/18 1431  BP: (!) 95/59   Pulse: (!) 51   Resp: 16   Temp:    SpO2: 96% 94%   Vitals:   07/29/18 0516 07/29/18 0740 07/29/18 1137 07/29/18 1431  BP: (!) 141/73  (!) 95/59   Pulse: 84  (!) 51   Resp: 19  16   Temp: 98.2 F (36.8 C)     TempSrc: Oral     SpO2: 97% 96% 96% 94%  Weight:      Height:        General: Pt is alert, awake, not in acute distress Cardiovascular: RRR, S1/S2 +, no rubs, no gallops Respiratory: Moving air well, diffuse mild expiratory wheezing. No crackles or rales  Abdominal: Soft, NT, ND, bowel sounds  + Extremities: no edema, no cyanosis   The results of significant diagnostics from this hospitalization (including imaging, microbiology, ancillary and laboratory) are listed below for reference.     Microbiology: Recent Results (from the past 240 hour(s))  Blood culture (routine x 2)     Status: None (Preliminary result)   Collection Time: 07/28/18  2:55 AM  Result Value Ref Range Status   Specimen Description   Final    BLOOD LEFT FOREARM Performed at [facility], [address] [place], [place] [address]    Special Requests   Final    BOTTLES DRAWN AEROBIC AND ANAEROBIC Blood Culture adequate volume Performed at [facility], [address] [place], [place] [address]    Culture   Final    NO GROWTH 1 DAY Performed at [facility], [address], [place], [place] [address]    Report Status PENDING  Incomplete  Blood culture (routine x 2)     Status: None (Preliminary result)   Collection Time: 07/28/18  3:05 AM  Result Value Ref Range Status   Specimen Description   Final    BLOOD RIGHT ANTECUBITAL Performed at [facility], [address] [place], [place] [address]    Special Requests   Final    BOTTLES DRAWN AEROBIC AND ANAEROBIC Blood Culture adequate volume Performed at [facility], [address] [place], [place] [address]    Culture   Final    NO GROWTH 1 DAY Performed at [facility], [address] [place], [place] [address]    Report Status PENDING  Incomplete  Respiratory Panel by PCR     Status: Abnormal   Collection Time: 07/28/18  9:21 AM  Result Value Ref Range Status   Adenovirus NOT DETECTED NOT DETECTED Final   Coronavirus 229E NOT DETECTED NOT DETECTED Final   Coronavirus HKU1 NOT DETECTED NOT DETECTED Final   Coronavirus NL63 NOT DETECTED NOT DETECTED Final   Coronavirus OC43 NOT DETECTED NOT DETECTED Final   Metapneumovirus NOT DETECTED NOT DETECTED Final   Rhinovirus / Enterovirus  DETECTED (A) NOT DETECTED Final   Influenza A NOT DETECTED NOT DETECTED Final   Influenza B NOT DETECTED NOT DETECTED Final   Parainfluenza Virus 1 NOT DETECTED NOT DETECTED Final   Parainfluenza Virus 2  NOT DETECTED NOT DETECTED Final   Parainfluenza Virus 3 NOT DETECTED NOT DETECTED Final   Parainfluenza Virus 4 NOT DETECTED NOT DETECTED Final   Respiratory Syncytial Virus NOT DETECTED NOT DETECTED Final   Bordetella pertussis NOT DETECTED NOT DETECTED Final   Chlamydophila pneumoniae NOT DETECTED NOT DETECTED Final   Mycoplasma pneumoniae NOT DETECTED NOT DETECTED Final    Comment: Performed at [facility], [address] [place], [place] [address]  Culture, sputum-assessment     Status: None   Collection Time: 07/29/18 10:08 AM  Result Value Ref Range Status   Specimen Description SPUTUM  Final   Special Requests NONE  Final   Sputum evaluation   Final    THIS SPECIMEN IS ACCEPTABLE FOR SPUTUM CULTURE Performed at [facility], [address], [place], [place] [address]    Report Status 07/29/2018 FINAL  Final     Labs: BNP (last 3 results) No results for input(s): BNP in the last 8760 hours. Basic Metabolic Panel: Recent Labs  Lab 07/27/18 2321 07/28/18 0611 07/29/18 0529  NA 140 145 145  K 3.6 3.4* 4.0  CL 109 113* 114*  CO2 23 20* 22  GLUCOSE 96 157* 113*  BUN 8 8 9   CREATININE 0.83 0.78 0.72  CALCIUM 9.2 8.6* 9.7   Liver Function Tests: Recent Labs  Lab 07/28/18 0611  AST 22  ALT 9  ALKPHOS 75  BILITOT 0.7  PROT 6.4*  ALBUMIN 3.6   No results for input(s): LIPASE, AMYLASE in the last 168 hours. No results for input(s): AMMONIA in the last 168 hours. CBC: Recent Labs  Lab 07/27/18 2321 07/28/18 0611 07/29/18 0529 07/29/18 1158  WBC 20.5* 17.0* 26.3* 21.4*  NEUTROABS 17.3* 16.4*  --  19.1*  HGB 15.1* 13.0 13.1 12.3  HCT 43.6 38.7 39.5 36.9  MCV 89.0 91.5 91.9 92.0  PLT 361 351 409* 370   Cardiac Enzymes: Recent  Labs  Lab 07/27/18 2321 07/28/18 0611  TROPONINI <0.03 <0.03   BNP: Invalid input(s): POCBNP CBG: No results for input(s): GLUCAP in the last 168 hours. D-Dimer Recent Labs    07/27/18 2321  DDIMER <0.27   Hgb A1c No results for input(s): HGBA1C in the last 72 hours. Lipid Profile No results for input(s): CHOL, HDL, LDLCALC, TRIG, CHOLHDL, LDLDIRECT in the last 72 hours. Thyroid function studies Recent Labs    07/28/18 0611  TSH 0.774   Anemia work up No results for input(s): VITAMINB12, FOLATE, FERRITIN, TIBC, IRON, RETICCTPCT in the last 72 hours. Urinalysis    Component Value Date/Time   COLORURINE YELLOW 07/27/2018 2233   APPEARANCEUR CLOUDY (A) 07/27/2018 2233   LABSPEC 1.015 07/27/2018 2233   PHURINE 6.5 07/27/2018 2233   GLUCOSEU NEGATIVE 07/27/2018 2233   HGBUR NEGATIVE 07/27/2018 2233   BILIRUBINUR NEGATIVE 07/27/2018 2233   KETONESUR NEGATIVE 07/27/2018 2233   PROTEINUR NEGATIVE 07/27/2018 2233   UROBILINOGEN 0.2 06/14/2013 2203   NITRITE NEGATIVE 07/27/2018 2233   LEUKOCYTESUR NEGATIVE 07/27/2018 2233   Sepsis Labs Invalid input(s): PROCALCITONIN,  WBC,  LACTICIDVEN Microbiology Recent Results (from the past 240 hour(s))  Blood culture (routine x 2)     Status: None (Preliminary result)   Collection Time: 07/28/18  2:55 AM  Result Value Ref Range Status   Specimen Description   Final    BLOOD LEFT FOREARM Performed at [facility], [address] [place], [place] [address]    Special Requests   Final  BOTTLES DRAWN AEROBIC AND ANAEROBIC Blood Culture adequate volume Performed at [facility], [address] [place], [place] [address]    Culture   Final    NO GROWTH 1 DAY Performed at [facility], [address], [place], [place] [address]    Report Status PENDING  Incomplete  Blood culture (routine x 2)     Status: None (Preliminary result)   Collection Time: 07/28/18  3:05 AM  Result Value Ref Range Status    Specimen Description   Final    BLOOD RIGHT ANTECUBITAL Performed at [facility], [address] [place], [place] [address]    Special Requests   Final    BOTTLES DRAWN AEROBIC AND ANAEROBIC Blood Culture adequate volume Performed at [facility], [address], [place], [place] [address]    Culture   Final    NO GROWTH 1 DAY Performed at [facility], [address] [place], [place] [address]    Report Status PENDING  Incomplete  Respiratory Panel by PCR     Status: Abnormal   Collection Time: 07/28/18  9:21 AM  Result Value Ref Range Status   Adenovirus NOT DETECTED NOT DETECTED Final   Coronavirus 229E NOT DETECTED NOT DETECTED Final   Coronavirus HKU1 NOT DETECTED NOT DETECTED Final   Coronavirus NL63 NOT DETECTED NOT DETECTED Final   Coronavirus OC43 NOT DETECTED NOT DETECTED Final   Metapneumovirus NOT DETECTED NOT DETECTED Final   Rhinovirus / Enterovirus DETECTED (A) NOT DETECTED Final   Influenza A NOT DETECTED NOT DETECTED Final   Influenza B NOT DETECTED NOT DETECTED Final   Parainfluenza Virus 1 NOT DETECTED NOT DETECTED Final   Parainfluenza Virus 2 NOT DETECTED NOT DETECTED Final   Parainfluenza Virus 3 NOT DETECTED NOT DETECTED Final   Parainfluenza Virus 4 NOT DETECTED NOT DETECTED Final   Respiratory Syncytial Virus NOT DETECTED NOT DETECTED Final   Bordetella pertussis NOT DETECTED NOT DETECTED Final   Chlamydophila pneumoniae NOT DETECTED NOT DETECTED Final   Mycoplasma pneumoniae NOT DETECTED NOT DETECTED Final    Comment: Performed at [facility], [address] [place], [place] [address]  Culture, sputum-assessment     Status: None   Collection Time: 07/29/18 10:08 AM  Result Value Ref Range Status   Specimen Description SPUTUM  Final   Special Requests NONE  Final   Sputum evaluation   Final    THIS SPECIMEN IS ACCEPTABLE FOR SPUTUM CULTURE Performed at [facility], [address]  [place], [place] [address]    Report Status 07/29/2018 FINAL  Final    Time coordinating discharge: 32 minutes  SIGNED:  Latrelle Dodrill, MD  [facility] 07/29/2018, 3:11 PM  Pager please text page via  [email or web address]  Note - This record has been created using [employer]. Chart creation errors have been sought, but may not always have been located. Such creation errors do not reflect on the standard of medical care.

## 2018-07-29 NOTE — Consult Note (Addendum)
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 Linear and Descriptions of Associations: Intact  Orientation:  Full (Time, Place, and Person)  Thought Content:  Logical  Suicidal Thoughts:  Yes.  without intent/plan  Homicidal Thoughts:  No  Memory:  Immediate;   Good Recent;   Good Remote;   Good  Judgement:  Fair  Insight:   Fair  Psychomotor Activity:  Normal  Concentration:  Concentration: Good and Attention Span: Good  Recall:  Good  Fund of Knowledge:  Good  Language:  Good  Akathisia:  No  Handed:  Right  AIMS (if indicated):   N/A  Assets:  Communication Skills Desire for Improvement Housing Leisure Time Resilience Social Support  ADL's:  Intact  Cognition:  WNL  Sleep:   Poor   Assessment:  Misty Jimenez is a 23 y.o. female who was admitted with acute respiratory failure with hypoxia and was found to have bronchitis. She endorses depression and anxiety for the past year in the setting of multiple stressors. She self-medicates with alcohol. She endorses fleeting SI since August without any intention to harm self. She reports poor sleep. She denies HI or AVH. Recommend Lexapro for depression and anxiety and Trazodone for insomnia. She is able to safety plan and is future oriented. She does not warrant inpatient psychiatric hospitalization at this time.    Treatment Plan Summary: -Start Lexapro 10 mg daily for depression and anxiety.  -Start Trazodone 50 mg qhs PRN for insomnia.  -EKG reviewed and QTc 420 on 10/1. Please closely monitor when starting or increasing QTc prolonging agents.  -SW to provide outpatient resources for psychiatrist and therapist.     Disposition: No evidence of imminent risk to self or others at present.    Faythe Dingwall, DO 07/29/2018 11:19 AM

## 2018-07-31 LAB — CULTURE, RESPIRATORY

## 2018-07-31 LAB — CULTURE, RESPIRATORY W GRAM STAIN: Culture: NORMAL

## 2018-08-02 LAB — CULTURE, BLOOD (ROUTINE X 2)
Culture: NO GROWTH
Culture: NO GROWTH
Special Requests: ADEQUATE
Special Requests: ADEQUATE

## 2018-08-17 ENCOUNTER — Encounter: Payer: Self-pay | Admitting: Family Medicine

## 2018-08-17 ENCOUNTER — Ambulatory Visit (INDEPENDENT_AMBULATORY_CARE_PROVIDER_SITE_OTHER): Payer: 59 | Admitting: Family Medicine

## 2018-08-17 VITALS — BP 90/60 | HR 67 | Temp 97.9°F | Resp 18 | Wt 250.8 lb

## 2018-08-17 DIAGNOSIS — F319 Bipolar disorder, unspecified: Secondary | ICD-10-CM

## 2018-08-17 DIAGNOSIS — R3915 Urgency of urination: Secondary | ICD-10-CM | POA: Diagnosis not present

## 2018-08-17 DIAGNOSIS — S99921A Unspecified injury of right foot, initial encounter: Secondary | ICD-10-CM

## 2018-08-17 DIAGNOSIS — J206 Acute bronchitis due to rhinovirus: Secondary | ICD-10-CM

## 2018-08-17 DIAGNOSIS — R5383 Other fatigue: Secondary | ICD-10-CM

## 2018-08-17 DIAGNOSIS — Z23 Encounter for immunization: Secondary | ICD-10-CM | POA: Diagnosis not present

## 2018-08-17 DIAGNOSIS — D72829 Elevated white blood cell count, unspecified: Secondary | ICD-10-CM

## 2018-08-17 DIAGNOSIS — R45851 Suicidal ideations: Secondary | ICD-10-CM

## 2018-08-17 DIAGNOSIS — N289 Disorder of kidney and ureter, unspecified: Secondary | ICD-10-CM

## 2018-08-17 MED ORDER — ALBUTEROL SULFATE HFA 108 (90 BASE) MCG/ACT IN AERS: 2.0000 | INHALATION_SPRAY | Freq: Four times a day (QID) | RESPIRATORY_TRACT | 1 refills | Status: DC | PRN

## 2018-08-17 MED ORDER — QUETIAPINE FUMARATE 100 MG PO TABS: ORAL_TABLET | ORAL | 1 refills | Status: DC

## 2018-08-17 MED ORDER — BUSPIRONE HCL 10 MG PO TABS: 10.0000 mg | ORAL_TABLET | Freq: Three times a day (TID) | ORAL | 1 refills | Status: DC

## 2018-08-17 NOTE — Assessment & Plan Note (Signed)
Check cmp 

## 2018-08-17 NOTE — Assessment & Plan Note (Addendum)
She denies curent suicidal ideation or plan and agrees to seek care if symptoms worsen

## 2018-08-17 NOTE — Patient Instructions (Signed)
Living With Bipolar Disorder If you have been diagnosed with bipolar disorder, you may be relieved that you now know why you have felt or behaved a certain way. You may also feel overwhelmed about the treatment ahead, how to get the support you need, and how to deal with the condition day-to-day. With care and support, you can learn to manage your symptoms and live with bipolar disorder. How to manage lifestyle changes Managing stress Stress is your body's reaction to life changes and events, both good and bad. Stress can play a major role in bipolar disorder, so it is important to learn how to cope with stress. Some techniques to cope with stress include:  Meditation, muscle relaxation, and breathing exercises.  Exercise. Even a short daily walk can help to lower stress levels.  Getting enough good-quality sleep. Too little sleep can cause mania to start (can trigger mania).  Making a schedule to manage your time. Knowing your daily schedule can help to keep you from feeling overwhelmed by tasks and deadlines.  Spending time on hobbies that you enjoy.  Medicines Your health care provider may suggest certain medicines if he or she feels that they will help improve your condition. Avoid using caffeine, alcohol, and other substances that may prevent your medicines from working properly (may interact). It is also important to:  Talk with your pharmacist or health care provider about all the medicines that you take, their possible side effects, and which medicines are safe to take together.  Make it your goal to take part in all treatment decisions (shared decision-making). Ask about possible side effects of medicines that your health care provider recommends, and tell him or her how you feel about having those side effects. It is best if shared decision-making with your health care provider is part of your total treatment plan.  If you are taking medicines as part of your treatment, do not stop  taking medicines before you ask your health care provider if it is safe to stop. You may need to have the medicine slowly decreased (tapered) over time to decrease the risk of harmful side effects. Relationships Spend time with people that you trust and with whom you feel a sense of understanding and calm. Try to find friends or family members who make you feel safe and can help you control feelings of mania. Consider going to couples counseling, family education classes, or family therapy to:  Educate your loved ones about your condition and offer suggestions about how they can support you.  Help resolve conflicts.  Help develop communication skills in your relationships.  How to recognize changes in your condition Everyone responds differently to treatment for bipolar disorder. Some signs that your condition is improving include:  Leveling of your mood. You may have less anger and excitement about daily activities, and your low moods may not be as bad.  Your symptoms being less intense.  Feeling calm more often.  Thinking clearly.  Not experiencing consequences for extreme behavior.  Feeling like your life is settling down.  Your behavior seeming more normal to you and to other people.  Some signs that your condition may be getting worse include:  Sleep problems.  Moods cycling between deep lows and unusually high (excess) energy.  Extreme emotions.  More anger at loved ones.  Staying away from others (isolating yourself).  A feeling of power or superiority.  Completing a lot of tasks in a very short amount of time.  Unusual thoughts and behaviors.    Suicidal thoughts.  Where to find support Talking to others  Try making a list of the people you may want to tell about your condition, such as the people you trust most.  Plan what you are willing to talk about and what you do not want to discuss. Think about your needs ahead of time, and how your friends and family  members can support you.  Let your loved ones know when they can share advice and when you would just like them to listen.  Give your loved ones information about bipolar disorder, and encourage them to learn about the condition. Finances Not all insurance plans cover mental health care, so it is important to check with your insurance carrier. If paying for co-pays or counseling services is a problem, search for a local or county mental health care center. Public mental health care services may be offered there at a low cost or no cost when you are not able to see a private health care provider. If you are taking medicine for depression, you may be able to get the generic form, which may be less expensive than brand-name medicine. Some makers of prescription medicines also offer help to patients who cannot afford the medicines they need. Follow these instructions at home: Medicines  Take over-the-counter and prescription medicines only as told by your health care provider or pharmacist.  Ask your pharmacist what over-the-counter cold medicines you should avoid. Some medicines can make symptoms worse. General instructions  Ask for support from trusted family members or friends to make sure you stay on track with your treatment.  Keep a journal to write down your daily moods, medicines, sleep habits, and life events. This may help you have more success with your treatment.  Make and follow a routine for daily meal times. Eat healthy foods, such as whole grains, vegetables, and fresh fruit.  Try to go to sleep and wake up around the same time every day.  Keep all follow-up visits as told by your health care provider. This is important. Questions to ask your health care provider:  If you are taking medicines: ? How long do I need to take medicine? ? Are there any long-term side effects of my medicine? ? Are there any alternatives to taking medicine?  How would I benefit from  therapy?  How often should I follow up with a health care provider? Contact a health care provider if:  Your symptoms get worse or they do not get better with treatment. Get help right away if:  You have thoughts about harming yourself or others. If you ever feel like you may hurt yourself or others, or have thoughts about taking your own life, get help right away. You can go to your nearest emergency department or call:  Your local emergency services (911 in the [place]  A suicide crisis helpline, such as the National Suicide Prevention Lifeline at 1-800-273-8255. This is open 24-hours a day.  Summary  Learning ways to deal with stress can help to calm you and may also help your treatment work better.  There is a wide range of medicines that can help to treat bipolar disorder.  Having healthy relationships can help to make your moods more stable.  Contact a health care provider if your symptoms get worse or they do not get better with treatment. This information is not intended to replace advice given to you by your health care provider. Make sure you discuss any questions you have with your   health care provider. Document Released: 02/12/2017 Document Revised: 02/12/2017 Document Reviewed: 02/12/2017 Elsevier Interactive Patient Education  2018 [employer]  

## 2018-08-19 ENCOUNTER — Other Ambulatory Visit (INDEPENDENT_AMBULATORY_CARE_PROVIDER_SITE_OTHER): Payer: 59

## 2018-08-19 DIAGNOSIS — R3915 Urgency of urination: Secondary | ICD-10-CM

## 2018-08-19 DIAGNOSIS — R5383 Other fatigue: Secondary | ICD-10-CM

## 2018-08-19 DIAGNOSIS — N289 Disorder of kidney and ureter, unspecified: Secondary | ICD-10-CM | POA: Diagnosis not present

## 2018-08-19 DIAGNOSIS — D72829 Elevated white blood cell count, unspecified: Secondary | ICD-10-CM

## 2018-08-19 LAB — CBC WITH DIFFERENTIAL/PLATELET
BASOS PCT: 0.5 % (ref 0.0–3.0)
Basophils Absolute: 0.1 10*3/uL (ref 0.0–0.1)
EOS ABS: 0.1 10*3/uL (ref 0.0–0.7)
Eosinophils Relative: 0.7 % (ref 0.0–5.0)
HEMATOCRIT: 43.8 % (ref 36.0–46.0)
Hemoglobin: 15 g/dL (ref 12.0–15.0)
LYMPHS ABS: 2.6 10*3/uL (ref 0.7–4.0)
Lymphocytes Relative: 19.5 % (ref 12.0–46.0)
MCHC: 34.4 g/dL (ref 30.0–36.0)
MCV: 89.7 fl (ref 78.0–100.0)
MONO ABS: 0.5 10*3/uL (ref 0.1–1.0)
Monocytes Relative: 4.1 % (ref 3.0–12.0)
NEUTROS ABS: 10.1 10*3/uL — AB (ref 1.4–7.7)
Neutrophils Relative %: 75.2 % (ref 43.0–77.0)
Platelets: 408 10*3/uL — ABNORMAL HIGH (ref 150.0–400.0)
RBC: 4.88 Mil/uL (ref 3.87–5.11)
RDW: 13.3 % (ref 11.5–14.6)
WBC: 13.4 10*3/uL — ABNORMAL HIGH (ref 4.5–10.5)

## 2018-08-19 LAB — URINALYSIS
Bilirubin Urine: NEGATIVE
Hgb urine dipstick: NEGATIVE
KETONES UR: NEGATIVE
Leukocytes, UA: NEGATIVE
Nitrite: NEGATIVE
PH: 6 (ref 5.0–8.0)
SPECIFIC GRAVITY, URINE: 1.025 (ref 1.000–1.030)
Total Protein, Urine: NEGATIVE
URINE GLUCOSE: NEGATIVE
Urobilinogen, UA: 0.2 (ref 0.0–1.0)

## 2018-08-19 LAB — TSH: TSH: 2.59 u[IU]/mL (ref 0.35–5.50)

## 2018-08-19 LAB — COMPREHENSIVE METABOLIC PANEL
ALT: 9 U/L (ref 0–35)
AST: 14 U/L (ref 0–37)
Albumin: 4.5 g/dL (ref 3.5–5.2)
Alkaline Phosphatase: 86 U/L (ref 39–117)
BUN: 13 mg/dL (ref 6–23)
CALCIUM: 9.8 mg/dL (ref 8.4–10.5)
CHLORIDE: 107 meq/L (ref 96–112)
CO2: 20 meq/L (ref 19–32)
CREATININE: 0.72 mg/dL (ref 0.40–1.20)
GFR: 109.45 mL/min (ref >60.00)
Glucose, Bld: 78 mg/dL (ref 70–99)
Potassium: 4 mEq/L (ref 3.5–5.1)
SODIUM: 139 meq/L (ref 135–145)
Total Bilirubin: 0.4 mg/dL (ref 0.2–1.2)
Total Protein: 7.1 g/dL (ref 6.0–8.3)

## 2018-08-19 LAB — T4, FREE: Free T4: 0.63 ng/dL (ref 0.60–1.60)

## 2018-08-20 LAB — URINE CULTURE
MICRO NUMBER: 91280917
SPECIMEN QUALITY:: ADEQUATE

## 2018-08-22 DIAGNOSIS — F314 Bipolar disorder, current episode depressed, severe, without psychotic features: Secondary | ICD-10-CM | POA: Insufficient documentation

## 2018-08-22 DIAGNOSIS — F319 Bipolar disorder, unspecified: Secondary | ICD-10-CM

## 2018-08-22 DIAGNOSIS — D72829 Elevated white blood cell count, unspecified: Secondary | ICD-10-CM | POA: Insufficient documentation

## 2018-08-22 DIAGNOSIS — R3915 Urgency of urination: Secondary | ICD-10-CM | POA: Insufficient documentation

## 2018-08-22 DIAGNOSIS — S99921A Unspecified injury of right foot, initial encounter: Secondary | ICD-10-CM | POA: Insufficient documentation

## 2018-08-22 NOTE — Assessment & Plan Note (Signed)
Greatly improved but still with some wheezing at times. Refill given on Albuterol

## 2018-08-22 NOTE — Assessment & Plan Note (Signed)
Given a Tdap after a puncture would of foot yesterday

## 2018-08-22 NOTE — Assessment & Plan Note (Signed)
Resolved on recheck 

## 2018-08-22 NOTE — Progress Notes (Signed)
Subjective:    Patient ID: Misty Jimenez, female    DOB: 09-Feb-1996, 22 y.o.   MRN: [id number]  No chief complaint on file.   HPI Patient is in today for ER follow up and reestablishing care. She has not been seen in a couple of years and she was recently in the ER with a meltdown and suicidal ideation. She denies any suicidal ideation has persisted. Is accompanied by her mother who struggles with depression. Other family members struggle with bipolar. Her fathers diagnosis is unknown although he did struggle with drugs and alcohol. She was given Lexapro and Trazadone but she is still feeling anxious and not sleeping. She notes she has used alcohol and drugs in past to self medicate so she is clear she does not want to do this again and has quit alcohol and declines Benzodiazepines. She is also noting fatigue. She punctured he right foot yesterday. No warmth or redness. Denies CP/palp/SOB/HA/congestion/fevers/GI or GU c/o. She notes anhedonia and agitation  Past Medical History:  Diagnosis Date  . Abdominal pain, chronic, right lower quadrant 08/03/2013  . Abdominal pain, recurrent    With Headache  . Acute pyelonephritis 05/07/2016   kidney surgery at 51 months of age  . ADHD (attention deficit hyperactivity disorder)   . Allergy   . Anxiety   . Depression   . Endometriosis   . Family history of adverse reaction to anesthesia    "grandmother gets PONV"  . GE reflux 12/16/2011  . Hyperlipidemia   . Kidney disease 05/07/2016  . PCOS (polycystic ovarian syndrome)   . Preventative health care 11/04/2016  . Reflux, vesicoureteral   . Wrist pain, left 11/04/2016    Past Surgical History:  Procedure Laterality Date  . KIDNEY SURGERY     As a small child  . TYMPANOSTOMY TUBE PLACEMENT    . URETER SURGERY    . WRIST SURGERY Left 05/2016   10 pins and 2 plates    Family History  Problem Relation Age of Onset  . Kidney disease Mother   . Migraines Maternal Aunt   . Diabetes Maternal  Grandfather   . Hyperlipidemia Other   . Hypertension Other   . Depression Other   . Alcohol abuse Father        and drug use, heroin, cocaine, marijuana  . Cancer Paternal Aunt        colon in 69s deceased    Social History   Socioeconomic History  . Marital status: Single    Spouse name: Not on file  . Number of children: Not on file  . Years of education: Not on file  . Highest education level: Not on file  Occupational History  . Not on file  Social Needs  . Financial resource strain: Not on file  . Food insecurity:    Worry: Not on file    Inability: Not on file  . Transportation needs:    Medical: Not on file    Non-medical: Not on file  Tobacco Use  . Smoking status: Current Every Day Smoker    Years: 0.50    Types: Cigarettes    Last attempt to quit: 10/28/2015    Years since quitting: 2.8  . Smokeless tobacco: Former Neurosurgeon    Types: Chew  Substance and Sexual Activity  . Alcohol use: Yes    Comment: occ  . Drug use: Yes    Types: Marijuana  . Sexual activity: Not on file  Lifestyle  .  Physical activity:    Days per week: Not on file    Minutes per session: Not on file  . Stress: Not on file  Relationships  . Social connections:    Talks on phone: Not on file    Gets together: Not on file    Attends religious service: Not on file    Active member of club or organization: Not on file    Attends meetings of clubs or organizations: Not on file    Relationship status: Not on file  . Intimate partner violence:    Fear of current or ex partner: Not on file    Emotionally abused: Not on file    Physically abused: Not on file    Forced sexual activity: Not on file  Other Topics Concern  . Not on file  Social History Narrative   Lives in boyfriend, completing High school   No dietary restrictions has a history of binge eating.. Former smoker, no alcohol or drug use.    Outpatient Medications Prior to Visit  Medication Sig Dispense Refill  .  escitalopram (LEXAPRO) 10 MG tablet Take 1 tablet (10 mg total) by mouth daily. 30 tablet 0  . traZODone (DESYREL) 50 MG tablet Take 1 tablet (50 mg total) by mouth at bedtime. 30 tablet 0  . acetaminophen (TYLENOL) 500 MG tablet Take 500 mg by mouth every 6 (six) hours as needed for moderate pain.    Marland Kitchen albuterol (PROVENTIL HFA;VENTOLIN HFA) 108 (90 Base) MCG/ACT inhaler Inhale 2 puffs into the lungs every 6 (six) hours as needed for wheezing or shortness of breath. 1 Inhaler 0  . chlorpheniramine-HYDROcodone (TUSSIONEX PENNKINETIC ER) 10-8 MG/5ML SUER Take 5 mLs by mouth every 12 (twelve) hours as needed for cough. 115 mL 0  . ibuprofen (ADVIL,MOTRIN) 600 MG tablet Take 1 tablet (600 mg total) by mouth every 6 (six) hours as needed for headache or moderate pain. 30 tablet 0  . ondansetron (ZOFRAN-ODT) 8 MG disintegrating tablet Take 1 tablet (8 mg total) by mouth every 8 (eight) hours as needed for nausea or vomiting. 20 tablet 0   No facility-administered medications prior to visit.     Allergies  Allergen Reactions  . Sulfa Antibiotics Rash and Hives  . Sulfasalazine Hives  . Other     Fiberglass cast caused rash and hives  . Monosodium Glutamate Nausea And Vomiting, Rash and Other (See Comments)    Other reaction(s): Other (See Comments) Headache and dizziness Headache and dizziness  . Sulfa Drugs Cross Reactors Rash    Review of Systems  Constitutional: Positive for malaise/fatigue. Negative for fever.  HENT: Negative for congestion.   Eyes: Negative for blurred vision.  Respiratory: Positive for shortness of breath and wheezing.   Cardiovascular: Negative for chest pain, palpitations and leg swelling.  Gastrointestinal: Negative for abdominal pain, blood in stool and nausea.  Genitourinary: Negative for dysuria and frequency.  Musculoskeletal: Negative for falls.  Skin: Negative for rash.  Neurological: Negative for dizziness, loss of consciousness and headaches.    Endo/Heme/Allergies: Negative for environmental allergies.  Psychiatric/Behavioral: Positive for depression. Negative for hallucinations and suicidal ideas. The patient has insomnia. The patient is not nervous/anxious.        Objective:    Physical Exam  Constitutional: She is oriented to person, place, and time. No distress.  HENT:  Head: Normocephalic and atraumatic.  Right Ear: External ear normal.  Left Ear: External ear normal.  Nose: Nose normal.  Mouth/Throat: Oropharynx is clear  and moist. No oropharyngeal exudate.  Eyes: Pupils are equal, round, and reactive to light. Conjunctivae are normal. Right eye exhibits no discharge. Left eye exhibits no discharge. No scleral icterus.  Neck: Normal range of motion. Neck supple. No thyromegaly present.  Cardiovascular: Normal rate, regular rhythm, normal heart sounds and intact distal pulses.  No murmur heard. Pulmonary/Chest: Effort normal and breath sounds normal. No respiratory distress. She has no wheezes. She has no rales.  Abdominal: Soft. Bowel sounds are normal. She exhibits no distension and no mass. There is no tenderness.  Musculoskeletal: Normal range of motion. She exhibits no edema or tenderness.  Lymphadenopathy:    She has no cervical adenopathy.  Neurological: She is alert and oriented to person, place, and time. She has normal reflexes. She displays normal reflexes. No cranial nerve deficit. Coordination normal.  Skin: Skin is warm and dry. No rash noted. She is not diaphoretic.    BP 90/60 (BP Location: Left Arm, Patient Position: Sitting, Cuff Size: Normal)   Pulse 67   Temp 97.9 F (36.6 C) (Oral)   Resp 18   Wt 250 lb 12.8 oz (113.8 kg)   SpO2 96%   BMI 39.28 kg/m  Wt Readings from Last 3 Encounters:  08/17/18 250 lb 12.8 oz (113.8 kg)  07/28/18 251 lb 9.6 oz (114.1 kg)  07/03/17 265 lb (120.2 kg) (>99 %, Z= 2.59)*   * Growth percentiles are based on CDC (Girls, 2-20 Years) data.     Lab Results   Component Value Date   WBC 13.4 (H) 08/19/2018   HGB 15.0 08/19/2018   HCT 43.8 08/19/2018   PLT 408.0 (H) 08/19/2018   GLUCOSE 78 08/19/2018   CHOL 198 11/04/2016   TRIG 102.0 11/04/2016   HDL 45.50 11/04/2016   LDLCALC 132 (H) 11/04/2016   ALT 9 08/19/2018   AST 14 08/19/2018   NA 139 08/19/2018   K 4.0 08/19/2018   CL 107 08/19/2018   CREATININE 0.72 08/19/2018   BUN 13 08/19/2018   CO2 20 08/19/2018   TSH 2.59 08/19/2018   HGBA1C 5.1 11/24/2014    Lab Results  Component Value Date   TSH 2.59 08/19/2018   Lab Results  Component Value Date   WBC 13.4 (H) 08/19/2018   HGB 15.0 08/19/2018   HCT 43.8 08/19/2018   MCV 89.7 08/19/2018   PLT 408.0 (H) 08/19/2018   Lab Results  Component Value Date   NA 139 08/19/2018   K 4.0 08/19/2018   CO2 20 08/19/2018   GLUCOSE 78 08/19/2018   BUN 13 08/19/2018   CREATININE 0.72 08/19/2018   BILITOT 0.4 08/19/2018   ALKPHOS 86 08/19/2018   AST 14 08/19/2018   ALT 9 08/19/2018   PROT 7.1 08/19/2018   ALBUMIN 4.5 08/19/2018   CALCIUM 9.8 08/19/2018   ANIONGAP 9 07/29/2018   GFR 109.45 08/19/2018   Lab Results  Component Value Date   CHOL 198 11/04/2016   Lab Results  Component Value Date   HDL 45.50 11/04/2016   Lab Results  Component Value Date   LDLCALC 132 (H) 11/04/2016   Lab Results  Component Value Date   TRIG 102.0 11/04/2016   Lab Results  Component Value Date   CHOLHDL 4 11/04/2016   Lab Results  Component Value Date   HGBA1C 5.1 11/24/2014       Assessment & Plan:   Problem List Items Addressed This Visit    Kidney disease    Check cmp  Relevant Orders   TSH (Completed)   T4, free (Completed)   Comprehensive metabolic panel (Completed)   Acute bronchitis    Greatly improved but still with some wheezing at times. Refill given on Albuterol      Suicidal ideations    She denies curent suicidal ideation or plan and agrees to seek care if symptoms worsen      Fatigue   Relevant  Orders   TSH (Completed)   T4, free (Completed)   Leukocytosis    Resolved on recheck      Relevant Orders   TSH (Completed)   T4, free (Completed)   CBC with Differential/Platelet (Completed)   Urinary urgency - Primary   Relevant Orders   Comprehensive metabolic panel (Completed)   Urinalysis (Completed)   Urine Culture (Completed)   Bipolar and related disorder (HCC)    The Mood Disorder Questionnaire showed 10/13 positive in Question #1. Yes to #2 and serious problem in question 3. Does have family members with bipolar with a maternal aunt and maternal great aunt carrying this diagnosis. She follows with a counselor Jacinto Reap in [place]. Referred to psychiatry and stated on Seroquel to titrate up to 300 mg daily, counseled and spent face to face time with patient for 45 minutes      Relevant Orders   Ambulatory referral to Psychiatry   Right foot injury, initial encounter    Given a Tdap after a puncture would of foot yesterday         I have discontinued [place] [facility]'s acetaminophen, ibuprofen, ondansetron, chlorpheniramine-HYDROcodone, escitalopram, and traZODone. I am also having her start on QUEtiapine and busPIRone. Additionally, I am having her maintain her albuterol.  Meds ordered this encounter  Medications  . QUEtiapine (SEROQUEL) 100 MG tablet    Sig: 1/2 tab po qhs X 1d then 1 tab po qhs X 1d then 2 tab po qhs  1d then 3 tabs po qhs daily    Dispense:  90 tablet    Refill:  1  . busPIRone (BUSPAR) 10 MG tablet    Sig: Take 1 tablet (10 mg total) by mouth 3 (three) times daily.    Dispense:  90 tablet    Refill:  1  . DISCONTD: albuterol (PROVENTIL HFA;VENTOLIN HFA) 108 (90 Base) MCG/ACT inhaler    Sig: Inhale 2 puffs into the lungs every 6 (six) hours as needed for wheezing or shortness of breath.    Dispense:  1 Inhaler    Refill:  1  . albuterol (PROVENTIL HFA;VENTOLIN HFA) 108 (90 Base) MCG/ACT inhaler    Sig: Inhale 2 puffs into the  lungs every 6 (six) hours as needed for wheezing or shortness of breath.    Dispense:  2 Inhaler    Refill:  1     Danise Edge, MD

## 2018-08-22 NOTE — Assessment & Plan Note (Addendum)
The Mood Disorder Questionnaire showed 10/13 positive in Question #1. Yes to #2 and serious problem in question 3. Does have family members with bipolar with a maternal aunt and maternal great aunt carrying this diagnosis. She follows with a counselor Jacinto Reap in [place]. Referred to psychiatry and stated on Seroquel to titrate up to 300 mg daily, counseled and spent face to face time with patient for 45 minutes

## 2018-09-10 ENCOUNTER — Ambulatory Visit (INDEPENDENT_AMBULATORY_CARE_PROVIDER_SITE_OTHER): Payer: 59 | Admitting: Family Medicine

## 2018-09-10 ENCOUNTER — Ambulatory Visit (HOSPITAL_BASED_OUTPATIENT_CLINIC_OR_DEPARTMENT_OTHER)
Admission: RE | Admit: 2018-09-10 | Discharge: 2018-09-10 | Disposition: A | Discharge status: Home / Self Care | Payer: 59 | Source: Ambulatory Visit | Attending: Family Medicine | Admitting: Family Medicine

## 2018-09-10 VITALS — BP 98/62 | HR 80 | Temp 98.1°F | Resp 18 | Wt 246.0 lb

## 2018-09-10 DIAGNOSIS — M4186 Other forms of scoliosis, lumbar region: Secondary | ICD-10-CM | POA: Insufficient documentation

## 2018-09-10 DIAGNOSIS — M5489 Other dorsalgia: Secondary | ICD-10-CM | POA: Insufficient documentation

## 2018-09-10 DIAGNOSIS — F319 Bipolar disorder, unspecified: Secondary | ICD-10-CM

## 2018-09-10 DIAGNOSIS — J206 Acute bronchitis due to rhinovirus: Secondary | ICD-10-CM

## 2018-09-10 MED ORDER — TIZANIDINE HCL 4 MG PO TABS: 2.0000 mg | ORAL_TABLET | Freq: Two times a day (BID) | ORAL | 3 refills | Status: DC | PRN

## 2018-09-10 MED ORDER — BUSPIRONE HCL 10 MG PO TABS: 10.0000 mg | ORAL_TABLET | Freq: Three times a day (TID) | ORAL | 1 refills | Status: DC

## 2018-09-10 MED ORDER — ALBUTEROL SULFATE HFA 108 (90 BASE) MCG/ACT IN AERS: 2.0000 | INHALATION_SPRAY | Freq: Four times a day (QID) | RESPIRATORY_TRACT | 1 refills | Status: DC | PRN

## 2018-09-10 MED ORDER — QUETIAPINE FUMARATE 300 MG PO TABS: 300.0000 mg | ORAL_TABLET | Freq: Every day | ORAL | 3 refills | Status: DC

## 2018-09-10 NOTE — Patient Instructions (Signed)
Try Advil/Ibuprofen/Motrin 200 mg tabs, 1-2 tabs up to 3 x daily Try Tylenol/Acetaminophen ES 500 mg tabs, 1-2 tabs up to 3 x daily Back Pain, Adult Many adults have back pain from time to time. Common causes of back pain include:  A strained muscle or ligament.  Wear and tear (degeneration) of the spinal disks.  Arthritis.  A hit to the back.  Back pain can be short-lived (acute) or last a long time (chronic). A physical exam, lab tests, and imaging studies may be done to find the cause of your pain. Follow these instructions at home: Managing pain and stiffness  Take over-the-counter and prescription medicines only as told by your health care provider.  If directed, apply heat to the affected area as often as told by your health care provider. Use the heat source that your health care provider recommends, such as a moist heat pack or a heating pad. ? Place a towel between your skin and the heat source. ? Leave the heat on for 20-30 minutes. ? Remove the heat if your skin turns bright red. This is especially important if you are unable to feel pain, heat, or cold. You have a greater risk of getting burned.  If directed, apply ice to the injured area: ? Put ice in a plastic bag. ? Place a towel between your skin and the bag. ? Leave the ice on for 20 minutes, 2-3 times a day for the first 2-3 days. Activity  Do not stay in bed. Resting more than 1-2 days can delay your recovery.  Take short walks on even surfaces as soon as you are able. Try to increase the length of time you walk each day.  Do not sit, drive, or stand in one place for more than 30 minutes at a time. Sitting or standing for long periods of time can put stress on your back.  Use proper lifting techniques. When you bend and lift, use positions that put less stress on your back: ? [place] your knees. ? Keep the load close to your body. ? Avoid twisting.  Exercise regularly as told by your health care provider.  Exercising will help your back heal faster. This also helps prevent back injuries by keeping muscles strong and flexible.  Your health care provider may recommend that you see a physical therapist. This person can help you come up with a safe exercise program. Do any exercises as told by your physical therapist. Lifestyle  Maintain a healthy weight. Extra weight puts stress on your back and makes it difficult to have good posture.  Avoid activities or situations that make you feel anxious or stressed. Learn ways to manage anxiety and stress. One way to manage stress is through exercise. Stress and anxiety increase muscle tension and can make back pain worse. General instructions  Sleep on a firm mattress in a comfortable position. Try lying on your side with your knees slightly bent. If you lie on your back, put a pillow under your knees.  Follow your treatment plan as told by your health care provider. This may include: ? Cognitive or behavioral therapy. ? Acupuncture or massage therapy. ? Meditation or yoga. Contact a health care provider if:  You have pain that is not relieved with rest or medicine.  You have increasing pain going down into your legs or buttocks.  Your pain does not improve in 2 weeks.  You have pain at night.  You lose weight.  You have a fever or  chills. Get help right away if:  You develop new bowel or bladder control problems.  You have unusual weakness or numbness in your arms or legs.  You develop nausea or vomiting.  You develop abdominal pain.  You feel faint. Summary  Many adults have back pain from time to time. A physical exam, lab tests, and imaging studies may be done to find the cause of your pain.  Use proper lifting techniques. When you bend and lift, use positions that put less stress on your back.  Take over-the-counter and prescription medicines and apply heat or ice as directed by your health care provider. This information is not  intended to replace advice given to you by your health care provider. Make sure you discuss any questions you have with your health care provider. Document Released: 10/13/2005 Document Revised: 11/17/2016 Document Reviewed: 11/17/2016 Elsevier Interactive Patient Education  [employer]2018 [employer]

## 2018-09-12 DIAGNOSIS — M5489 Other dorsalgia: Secondary | ICD-10-CM | POA: Insufficient documentation

## 2018-09-12 NOTE — Progress Notes (Signed)
Subjective:    Patient ID: Misty Jimenez, female    DOB: [birth year], 22 y.o.   MRN: [id number]  No chief complaint on file.   HPI Patient is in today for follow up.  She is feeling much better today.  She reports tolerating the Seroquel without any untoward side effects.  She feels more stable and less manic.  She still struggles with anhedonia but no suicidal ideation.  She is just heard from [facility] and will be seeing them soon.  She has an acute concern today and that is mid back pain.  She was doing a lot of heavy lifting and moving and the pain occurred after that.  No radiculopathy.  No incontinence.  No recent fall or trauma.  Pain is disrupting sleep.  Worse with certain movements. Denies CP/palp/SOB/HA/congestion/fevers/GI or GU c/o. Taking meds as prescribed  Past Medical History:  Diagnosis Date  . Abdominal pain, chronic, right lower quadrant 08/03/2013  . Abdominal pain, recurrent    With Headache  . Acute pyelonephritis 05/07/2016   kidney surgery at 22 months of age  . ADHD (attention deficit hyperactivity disorder)   . Allergy   . Anxiety   . Depression   . Endometriosis   . Family history of adverse reaction to anesthesia    "grandmother gets PONV"  . GE reflux 12/16/2011  . Hyperlipidemia   . Kidney disease 05/07/2016  . PCOS (polycystic ovarian syndrome)   . Preventative health care 11/04/2016  . Reflux, vesicoureteral   . Wrist pain, left 11/04/2016    Past Surgical History:  Procedure Laterality Date  . KIDNEY SURGERY     As a small child  . TYMPANOSTOMY TUBE PLACEMENT    . URETER SURGERY    . WRIST SURGERY Left 05/2016   10 pins and 2 plates    Family History  Problem Relation Age of Onset  . Kidney disease Mother   . Migraines Maternal Aunt   . Diabetes Maternal Grandfather   . Hyperlipidemia Other   . Hypertension Other   . Depression Other   . Alcohol abuse Father        and drug use, heroin, cocaine, marijuana  . Cancer  Paternal Aunt        colon in 29s deceased    Social History   Socioeconomic History  . Marital status: Single    Spouse name: Not on file  . Number of children: Not on file  . Years of education: Not on file  . Highest education level: Not on file  Occupational History  . Not on file  Social Needs  . Financial resource strain: Not on file  . Food insecurity:    Worry: Not on file    Inability: Not on file  . Transportation needs:    Medical: Not on file    Non-medical: Not on file  Tobacco Use  . Smoking status: Current Every Day Smoker    Years: 0.50    Types: Cigarettes    Last attempt to quit: 10/28/2015    Years since quitting: 2.8  . Smokeless tobacco: Former Neurosurgeon    Types: Chew  Substance and Sexual Activity  . Alcohol use: Yes    Comment: occ  . Drug use: Yes    Types: Marijuana  . Sexual activity: Not on file  Lifestyle  . Physical activity:    Days per week: Not on file    Minutes per session: Not on file  . Stress:  Not on file  Relationships  . Social connections:    Talks on phone: Not on file    Gets together: Not on file    Attends religious service: Not on file    Active member of club or organization: Not on file    Attends meetings of clubs or organizations: Not on file    Relationship status: Not on file  . Intimate partner violence:    Fear of current or ex partner: Not on file    Emotionally abused: Not on file    Physically abused: Not on file    Forced sexual activity: Not on file  Other Topics Concern  . Not on file  Social History Narrative   Lives in boyfriend, completing High school   No dietary restrictions has a history of binge eating.. Former smoker, no alcohol or drug use.    Outpatient Medications Prior to Visit  Medication Sig Dispense Refill  . albuterol (PROVENTIL HFA;VENTOLIN HFA) 108 (90 Base) MCG/ACT inhaler Inhale 2 puffs into the lungs every 6 (six) hours as needed for wheezing or shortness of breath. 2 Inhaler 1  .  busPIRone (BUSPAR) 10 MG tablet Take 1 tablet (10 mg total) by mouth 3 (three) times daily. 90 tablet 1  . QUEtiapine (SEROQUEL) 100 MG tablet 1/2 tab po qhs X 1d then 1 tab po qhs X 1d then 2 tab po qhs  1d then 3 tabs po qhs daily 90 tablet 1   No facility-administered medications prior to visit.     Allergies  Allergen Reactions  . Sulfa Antibiotics Rash and Hives  . Sulfasalazine Hives  . Other     Fiberglass cast caused rash and hives  . Monosodium Glutamate Nausea And Vomiting, Rash and Other (See Comments)    Other reaction(s): Other (See Comments) Headache and dizziness Headache and dizziness  . Sulfa Drugs Cross Reactors Rash    Review of Systems  Constitutional: Negative for fever and malaise/fatigue.  HENT: Negative for congestion.   Eyes: Negative for blurred vision.  Respiratory: Negative for shortness of breath.   Cardiovascular: Negative for chest pain, palpitations and leg swelling.  Gastrointestinal: Negative for abdominal pain, blood in stool and nausea.  Genitourinary: Negative for dysuria and frequency.  Musculoskeletal: Positive for back pain. Negative for falls.  Skin: Negative for rash.  Neurological: Negative for dizziness, loss of consciousness and headaches.  Endo/Heme/Allergies: Negative for environmental allergies.  Psychiatric/Behavioral: Positive for depression. Negative for hallucinations, substance abuse and suicidal ideas. The patient is not nervous/anxious.        Objective:    Physical Exam  Constitutional: She is oriented to person, place, and time. She appears well-developed and well-nourished. No distress.  HENT:  Head: Normocephalic and atraumatic.  Nose: Nose normal.  Eyes: Right eye exhibits no discharge. Left eye exhibits no discharge.  Neck: Normal range of motion. Neck supple.  Cardiovascular: Normal rate and regular rhythm.  No murmur heard. Pulmonary/Chest: Effort normal and breath sounds normal.  Abdominal: Soft. Bowel  sounds are normal. There is no tenderness.  Musculoskeletal: She exhibits no edema.  Neurological: She is alert and oriented to person, place, and time.  Skin: Skin is warm and dry.  Psychiatric: She has a normal mood and affect.  Nursing note and vitals reviewed.   BP 98/62 (BP Location: Left Arm, Patient Position: Sitting, Cuff Size: Normal)   Pulse 80   Temp 98.1 F (36.7 C) (Oral)   Resp 18   Wt 246 lb (  111.6 kg)   SpO2 98%   BMI 38.53 kg/m  Wt Readings from Last 3 Encounters:  09/10/18 246 lb (111.6 kg)  08/17/18 250 lb 12.8 oz (113.8 kg)  07/28/18 251 lb 9.6 oz (114.1 kg)     Lab Results  Component Value Date   WBC 13.4 (H) 08/19/2018   HGB 15.0 08/19/2018   HCT 43.8 08/19/2018   PLT 408.0 (H) 08/19/2018   GLUCOSE 78 08/19/2018   CHOL 198 11/04/2016   TRIG 102.0 11/04/2016   HDL 45.50 11/04/2016   LDLCALC 132 (H) 11/04/2016   ALT 9 08/19/2018   AST 14 08/19/2018   NA 139 08/19/2018   K 4.0 08/19/2018   CL 107 08/19/2018   CREATININE 0.72 08/19/2018   BUN 13 08/19/2018   CO2 20 08/19/2018   TSH 2.59 08/19/2018   HGBA1C 5.1 11/24/2014    Lab Results  Component Value Date   TSH 2.59 08/19/2018   Lab Results  Component Value Date   WBC 13.4 (H) 08/19/2018   HGB 15.0 08/19/2018   HCT 43.8 08/19/2018   MCV 89.7 08/19/2018   PLT 408.0 (H) 08/19/2018   Lab Results  Component Value Date   NA 139 08/19/2018   K 4.0 08/19/2018   CO2 20 08/19/2018   GLUCOSE 78 08/19/2018   BUN 13 08/19/2018   CREATININE 0.72 08/19/2018   BILITOT 0.4 08/19/2018   ALKPHOS 86 08/19/2018   AST 14 08/19/2018   ALT 9 08/19/2018   PROT 7.1 08/19/2018   ALBUMIN 4.5 08/19/2018   CALCIUM 9.8 08/19/2018   ANIONGAP 9 07/29/2018   GFR 109.45 08/19/2018   Lab Results  Component Value Date   CHOL 198 11/04/2016   Lab Results  Component Value Date   HDL 45.50 11/04/2016   Lab Results  Component Value Date   LDLCALC 132 (H) 11/04/2016   Lab Results  Component Value  Date   TRIG 102.0 11/04/2016   Lab Results  Component Value Date   CHOLHDL 4 11/04/2016   Lab Results  Component Value Date   HGBA1C 5.1 11/24/2014       Assessment & Plan:   Problem List Items Addressed This Visit    Acute bronchitis    Good response to treatment feels well today      Bipolar and related disorder (HCC)    Good response to Seroquel she feels calmer and less manic, still struggling with some anhedonia and has just been in contact with [facility] for further management. No changes today. Refills given. No suicidal ideation      Midline back pain - Primary    Encouraged moist heat and gentle stretching as tolerated. May try NSAIDs and prescription meds as directed and report if symptoms worsen or seek immediate care. Tizanidine and Lidocaine patches prn      Relevant Medications   tiZANidine (ZANAFLEX) 4 MG tablet   Other Relevant Orders   DG Lumbar Spine Complete (Completed)      I have discontinued [place] [facility]'s QUEtiapine. I am also having her start on QUEtiapine and tiZANidine. Additionally, I am having her maintain her busPIRone and albuterol.  Meds ordered this encounter  Medications  . busPIRone (BUSPAR) 10 MG tablet    Sig: Take 1 tablet (10 mg total) by mouth 3 (three) times daily.    Dispense:  90 tablet    Refill:  1  . albuterol (PROVENTIL HFA;VENTOLIN HFA) 108 (90 Base) MCG/ACT inhaler    Sig: Inhale  2 puffs into the lungs every 6 (six) hours as needed for wheezing or shortness of breath.    Dispense:  2 Inhaler    Refill:  1  . QUEtiapine (SEROQUEL) 300 MG tablet    Sig: Take 1 tablet (300 mg total) by mouth at bedtime.    Dispense:  30 tablet    Refill:  3  . tiZANidine (ZANAFLEX) 4 MG tablet    Sig: Take 0.5-1 tablets (2-4 mg total) by mouth 2 (two) times daily as needed for muscle spasms.    Dispense:  30 tablet    Refill:  3     Danise Edge, MD

## 2018-09-12 NOTE — Assessment & Plan Note (Addendum)
Good response to Seroquel she feels calmer and less manic, still struggling with some anhedonia and has just been in contact with [facility] for further management. No changes today. Refills given. No suicidal ideation

## 2018-09-12 NOTE — Assessment & Plan Note (Signed)
Encouraged moist heat and gentle stretching as tolerated. May try NSAIDs and prescription meds as directed and report if symptoms worsen or seek immediate care. Tizanidine and Lidocaine patches prn

## 2018-09-12 NOTE — Assessment & Plan Note (Signed)
Good response to treatment feels well today

## 2018-09-24 ENCOUNTER — Inpatient Hospital Stay (HOSPITAL_COMMUNITY)
Admission: AD | Admit: 2018-09-24 | Discharge: 2018-09-28 | DRG: 885 | Disposition: A | Discharge status: Home / Self Care | Payer: 59 | Source: Intra-hospital | Attending: Psychiatry | Admitting: Psychiatry

## 2018-09-24 ENCOUNTER — Encounter (HOSPITAL_COMMUNITY): Payer: Self-pay | Admitting: Internal Medicine

## 2018-09-24 ENCOUNTER — Emergency Department (HOSPITAL_COMMUNITY)
Admission: EM | Admit: 2018-09-24 | Discharge: 2018-09-24 | Disposition: A | Discharge status: Other Acute Inpatient Hospital | Payer: 59 | Attending: Emergency Medicine | Admitting: Emergency Medicine

## 2018-09-24 ENCOUNTER — Other Ambulatory Visit: Payer: Self-pay

## 2018-09-24 ENCOUNTER — Encounter (HOSPITAL_COMMUNITY): Payer: Self-pay | Admitting: *Deleted

## 2018-09-24 DIAGNOSIS — F332 Major depressive disorder, recurrent severe without psychotic features: Principal | ICD-10-CM | POA: Diagnosis present

## 2018-09-24 DIAGNOSIS — R45851 Suicidal ideations: Secondary | ICD-10-CM | POA: Insufficient documentation

## 2018-09-24 DIAGNOSIS — F17210 Nicotine dependence, cigarettes, uncomplicated: Secondary | ICD-10-CM | POA: Diagnosis present

## 2018-09-24 DIAGNOSIS — Z811 Family history of alcohol abuse and dependence: Secondary | ICD-10-CM

## 2018-09-24 DIAGNOSIS — G4700 Insomnia, unspecified: Secondary | ICD-10-CM | POA: Diagnosis present

## 2018-09-24 DIAGNOSIS — K219 Gastro-esophageal reflux disease without esophagitis: Secondary | ICD-10-CM | POA: Diagnosis present

## 2018-09-24 DIAGNOSIS — Z79899 Other long term (current) drug therapy: Secondary | ICD-10-CM | POA: Diagnosis not present

## 2018-09-24 DIAGNOSIS — F4310 Post-traumatic stress disorder, unspecified: Secondary | ICD-10-CM | POA: Diagnosis present

## 2018-09-24 DIAGNOSIS — F1010 Alcohol abuse, uncomplicated: Secondary | ICD-10-CM | POA: Diagnosis present

## 2018-09-24 DIAGNOSIS — Z91410 Personal history of adult physical and sexual abuse: Secondary | ICD-10-CM

## 2018-09-24 DIAGNOSIS — Z818 Family history of other mental and behavioral disorders: Secondary | ICD-10-CM

## 2018-09-24 DIAGNOSIS — E7800 Pure hypercholesterolemia, unspecified: Secondary | ICD-10-CM | POA: Diagnosis present

## 2018-09-24 LAB — CBC WITH DIFFERENTIAL/PLATELET
Abs Immature Granulocytes: 0.04 10*3/uL (ref 0.00–0.07)
BASOS ABS: 0.1 10*3/uL (ref 0.0–0.1)
BASOS PCT: 1 %
EOS ABS: 0.1 10*3/uL (ref 0.0–0.5)
EOS PCT: 1 %
HEMATOCRIT: 40.9 % (ref 36.0–46.0)
Hemoglobin: 13.5 g/dL (ref 12.0–15.0)
Immature Granulocytes: 0 %
LYMPHS ABS: 3.4 10*3/uL (ref 0.7–4.0)
Lymphocytes Relative: 27 %
MCH: 30 pg (ref 26.0–34.0)
MCHC: 33 g/dL (ref 30.0–36.0)
MCV: 90.9 fL (ref 80.0–100.0)
Monocytes Absolute: 0.8 10*3/uL (ref 0.1–1.0)
Monocytes Relative: 7 %
NRBC: 0 % (ref 0.0–0.2)
Neutro Abs: 8.2 10*3/uL — ABNORMAL HIGH (ref 1.7–7.7)
Neutrophils Relative %: 64 %
PLATELETS: 399 10*3/uL (ref 150–400)
RBC: 4.5 MIL/uL (ref 3.87–5.11)
RDW: 12.1 % (ref 11.5–15.5)
WBC: 12.6 10*3/uL — AB (ref 4.0–10.5)

## 2018-09-24 LAB — COMPREHENSIVE METABOLIC PANEL
ALT: 10 U/L (ref 0–44)
AST: 18 U/L (ref 15–41)
Albumin: 4 g/dL (ref 3.5–5.0)
Alkaline Phosphatase: 79 U/L (ref 38–126)
Anion gap: 11 (ref 5–15)
BILIRUBIN TOTAL: 0.6 mg/dL (ref 0.3–1.2)
BUN: 10 mg/dL (ref 6–20)
CALCIUM: 9.6 mg/dL (ref 8.9–10.3)
CO2: 20 mmol/L — ABNORMAL LOW (ref 22–32)
CREATININE: 0.87 mg/dL (ref 0.44–1.00)
Chloride: 109 mmol/L (ref 98–111)
GFR calc Af Amer: >60 mL/min (ref >60)
Glucose, Bld: 89 mg/dL (ref 70–99)
Potassium: 3.6 mmol/L (ref 3.5–5.1)
Sodium: 140 mmol/L (ref 135–145)
TOTAL PROTEIN: 6.9 g/dL (ref 6.5–8.1)

## 2018-09-24 LAB — RAPID URINE DRUG SCREEN, HOSP PERFORMED
Amphetamines: NOT DETECTED
Barbiturates: NOT DETECTED
Benzodiazepines: NOT DETECTED
Cocaine: NOT DETECTED
OPIATES: NOT DETECTED
Tetrahydrocannabinol: POSITIVE — AB

## 2018-09-24 LAB — I-STAT BETA HCG BLOOD, ED (MC, WL, AP ONLY): I-stat hCG, quantitative: <5 m[IU]/mL (ref <5)

## 2018-09-24 LAB — I-STAT TROPONIN, ED: Troponin i, poc: 0 ng/mL (ref 0.00–0.08)

## 2018-09-24 LAB — ETHANOL

## 2018-09-24 MED ORDER — MAGNESIUM HYDROXIDE 400 MG/5ML PO SUSP: 30.0000 mL | Freq: Every day | ORAL | Status: DC | PRN

## 2018-09-24 MED ORDER — NICOTINE 21 MG/24HR TD PT24
21.0000 mg | MEDICATED_PATCH | Freq: Every day | TRANSDERMAL | Status: DC
  Administered 2018-09-25 – 2018-09-27 (×3): 21 mg via TRANSDERMAL
  Filled 2018-09-24 (×5): qty 1

## 2018-09-24 MED ORDER — TIZANIDINE HCL 4 MG PO TABS: 2.0000 mg | ORAL_TABLET | Freq: Two times a day (BID) | ORAL | Status: DC | PRN

## 2018-09-24 MED ORDER — QUETIAPINE FUMARATE 300 MG PO TABS: 300.0000 mg | ORAL_TABLET | Freq: Every day | ORAL | Status: DC

## 2018-09-24 MED ORDER — ALUM & MAG HYDROXIDE-SIMETH 200-200-20 MG/5ML PO SUSP
30.0000 mL | ORAL | Status: DC | PRN
  Administered 2018-09-26: 30 mL via ORAL
  Filled 2018-09-24: qty 30

## 2018-09-24 MED ORDER — HYDROXYZINE HCL 25 MG PO TABS: 25.0000 mg | ORAL_TABLET | Freq: Three times a day (TID) | ORAL | Status: DC | PRN

## 2018-09-24 MED ORDER — ENSURE ENLIVE PO LIQD
237.0000 mL | Freq: Two times a day (BID) | ORAL | Status: DC
  Administered 2018-09-25 – 2018-09-27 (×4): 237 mL via ORAL

## 2018-09-24 MED ORDER — BUSPIRONE HCL 10 MG PO TABS
10.0000 mg | ORAL_TABLET | Freq: Three times a day (TID) | ORAL | Status: DC
  Administered 2018-09-25: 10 mg via ORAL
  Filled 2018-09-24 (×4): qty 1

## 2018-09-24 MED ORDER — TIZANIDINE HCL 2 MG PO TABS
2.0000 mg | ORAL_TABLET | Freq: Two times a day (BID) | ORAL | Status: DC | PRN
  Administered 2018-09-24: 2 mg via ORAL
  Filled 2018-09-24: qty 1

## 2018-09-24 MED ORDER — NAPROXEN SODIUM 220 MG PO TABS: 440.0000 mg | ORAL_TABLET | Freq: Two times a day (BID) | ORAL | Status: DC | PRN

## 2018-09-24 MED ORDER — ACETAMINOPHEN 325 MG PO TABS: 650.0000 mg | ORAL_TABLET | Freq: Four times a day (QID) | ORAL | Status: DC | PRN

## 2018-09-24 MED ORDER — QUETIAPINE FUMARATE 300 MG PO TABS
300.0000 mg | ORAL_TABLET | Freq: Every day | ORAL | Status: DC
  Administered 2018-09-24: 300 mg via ORAL
  Filled 2018-09-24 (×3): qty 1

## 2018-09-24 MED ORDER — ALBUTEROL SULFATE HFA 108 (90 BASE) MCG/ACT IN AERS: 2.0000 | INHALATION_SPRAY | Freq: Four times a day (QID) | RESPIRATORY_TRACT | Status: DC | PRN

## 2018-09-24 MED ORDER — BUSPIRONE HCL 10 MG PO TABS: 10.0000 mg | ORAL_TABLET | Freq: Three times a day (TID) | ORAL | Status: DC
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
## 2018-09-24 NOTE — ED Notes (Signed)
Please call Darra Lismanda Streed (mother) with updates: [phone]

## 2018-09-24 NOTE — ED Notes (Signed)
ED Provider at bedside. 

## 2018-09-24 NOTE — ED Notes (Signed)
Patient's belongings (clothes, shoes, phone, and wallet) to be taken home by family (mother- Darra Lismanda Harden).
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
## 2018-09-24 NOTE — Progress Notes (Signed)
[facility] Dr. Jama Flavorsobos, MD. Call to report [phone]. ED staff Monique, RN has been advised of pt's acceptance. Bed is ready now.  Misty Jimenez, MSW, LCSW Therapeutic Triage Specialist  [phone]

## 2018-09-24 NOTE — BH Assessment (Signed)
[facility] Assessment Progress Note   Per Reola Calkinsravis Money, NP pt is recommended for inpt treatment once medically cleared and labs and UDS are all reviewed. EDP Ward, Chase PicketJaime Pilcher, PA-C and pt's nurse Marcelino DusterMichelle, RN have been advised. [facility] to review for possible admission once medically cleared.   Princess BruinsAquicha Shakeila Pfarr, MSW, LCSW Therapeutic Triage Specialist  [phone]
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
## 2018-09-24 NOTE — Tx Team (Signed)
Initial Treatment Plan 09/24/2018 11:28 PM Misty HatchetDakota L Jimenez [id number]    PATIENT STRESSORS: Financial difficulties Legal issue Substance abuse Traumatic event   PATIENT STRENGTHS: Average or above average intelligence Capable of independent living Communication skills General fund of knowledge Motivation for treatment/growth Supportive family/friends   PATIENT IDENTIFIED PROBLEMS: Depression  Relationship issues that trigger suicidal thoughts.  Risk for self harm  Substance abuse(alcohol/marijuana))      "I need to get my sh-- together"  "Need to find some coping skills that do not include hurting myself"       DISCHARGE CRITERIA:  Ability to meet basic life and health needs Adequate post-discharge living arrangements Improved stabilization in mood, thinking, and/or behavior Need for constant or close observation no longer present Reduction of life-threatening or endangering symptoms to within safe limits Verbal commitment to aftercare and medication compliance  PRELIMINARY DISCHARGE PLAN: Attend aftercare/continuing care group Outpatient therapy Return to previous living arrangement  PATIENT/FAMILY INVOLVEMENT: This treatment plan has been presented to and reviewed with the patient, Misty Hatchetakota L Dewan, and/or family member.  The patient and family have been given the opportunity to ask questions and make suggestions.  Charlott HollerSpeagle, Kamelia Lampkins Church, RN 09/24/2018, 11:28 PM

## 2018-09-25 DIAGNOSIS — F323 Major depressive disorder, single episode, severe with psychotic features: Secondary | ICD-10-CM

## 2018-09-25 DIAGNOSIS — G4700 Insomnia, unspecified: Secondary | ICD-10-CM

## 2018-09-25 DIAGNOSIS — F4310 Post-traumatic stress disorder, unspecified: Secondary | ICD-10-CM

## 2018-09-25 DIAGNOSIS — F419 Anxiety disorder, unspecified: Secondary | ICD-10-CM

## 2018-09-25 LAB — TSH: TSH: 3.117 u[IU]/mL (ref 0.350–4.500)

## 2018-09-25 LAB — HEMOGLOBIN A1C
Hgb A1c MFr Bld: 4.6 % — ABNORMAL LOW (ref 4.8–5.6)
Mean Plasma Glucose: 85.32 mg/dL

## 2018-09-25 MED ORDER — HYDROXYZINE HCL 25 MG PO TABS
25.0000 mg | ORAL_TABLET | Freq: Four times a day (QID) | ORAL | Status: DC | PRN
  Administered 2018-09-26 – 2018-09-27 (×4): 25 mg via ORAL
  Filled 2018-09-25 (×4): qty 1

## 2018-09-25 MED ORDER — LORAZEPAM 0.5 MG PO TABS
0.5000 mg | ORAL_TABLET | Freq: Four times a day (QID) | ORAL | Status: DC | PRN
  Administered 2018-09-25 – 2018-09-26 (×3): 0.5 mg via ORAL
  Filled 2018-09-25 (×3): qty 1

## 2018-09-25 MED ORDER — FLUOXETINE HCL 20 MG PO CAPS
20.0000 mg | ORAL_CAPSULE | Freq: Every day | ORAL | Status: DC
  Administered 2018-09-25 – 2018-09-28 (×4): 20 mg via ORAL
  Filled 2018-09-25 (×5): qty 1

## 2018-09-25 MED ORDER — HYDROXYZINE HCL 25 MG PO TABS
ORAL_TABLET | ORAL | Status: AC
  Administered 2018-09-25: 23:00:00
  Filled 2018-09-25: qty 1

## 2018-09-25 MED ORDER — ARIPIPRAZOLE 5 MG PO TABS
5.0000 mg | ORAL_TABLET | Freq: Every day | ORAL | Status: DC
  Administered 2018-09-25 – 2018-09-27 (×3): 5 mg via ORAL
  Filled 2018-09-25 (×5): qty 1
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
## 2018-09-25 NOTE — Plan of Care (Signed)
  Problem: Activity: Goal: Will identify at least one activity in which they can participate Outcome: Progressing   
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
## 2018-09-25 NOTE — B[facility] Group Notes (Signed)
Adult Psychoeducational Group Note  Date:  09/25/2018 Time:  10:07 PM  Group Topic/Focus:  Wrap-Up Group:   The focus of this group is to help patients review their daily goal of treatment and discuss progress on daily workbooks.  Participation Level:  Active  Participation Quality:  Appropriate and Attentive  Affect:  Appropriate  Cognitive:  Alert and Appropriate  Insight: Appropriate and Good  Engagement in Group:  Engaged  Modes of Intervention:  Discussion and Education  Additional Comments:  Pt attended and participated in wrap up group this evening. Pt rated their day a 6/10, due to them feeling good about talking to their hall mates and opening up more. Pt goal is still in progress, which was to find coping skills for their anger and fear.    Misty NettersOctavia A Popowski 09/25/2018, 10:07 PM

## 2018-09-25 NOTE — Progress Notes (Signed)
D.  Pt pleasant on approach, complaint of anxiety.  Pt observed earlier hugging peer and rubbing on her arms in an effort to comfort her.  Staff explained to Pt that she may sit with peer and talk to her but touching is inappropriate in hospital setting.  Pt verbalized understanding but continued to talk about it as did peer.  Redirected by staff.  Pt calmer at medication time.  Pt did verbalize passive SI but does agree to safety on the unit.  Pt denies HI/AVH at this time.  A.  Support and encouragement offered, medication given as ordered  R.  Pt remains safe on the unit, will continue to monitor.

## 2018-09-26 DIAGNOSIS — G4700 Insomnia, unspecified: Secondary | ICD-10-CM | POA: Diagnosis not present

## 2018-09-26 DIAGNOSIS — F323 Major depressive disorder, single episode, severe with psychotic features: Secondary | ICD-10-CM | POA: Diagnosis not present

## 2018-09-26 DIAGNOSIS — F419 Anxiety disorder, unspecified: Secondary | ICD-10-CM | POA: Diagnosis not present

## 2018-09-26 DIAGNOSIS — F4310 Post-traumatic stress disorder, unspecified: Secondary | ICD-10-CM | POA: Diagnosis not present

## 2018-09-26 LAB — LIPID PANEL
Cholesterol: 258 mg/dL — ABNORMAL HIGH (ref 0–200)
HDL: 46 mg/dL (ref >40)
LDL Cholesterol: 198 mg/dL — ABNORMAL HIGH (ref 0–99)
Total CHOL/HDL Ratio: 5.6 RATIO
Triglycerides: 69 mg/dL (ref <150)
VLDL: 14 mg/dL (ref 0–40)

## 2018-09-26 MED ORDER — IBUPROFEN 600 MG PO TABS
ORAL_TABLET | ORAL | Status: AC
  Filled 2018-09-26: qty 1

## 2018-09-26 MED ORDER — IBUPROFEN 600 MG PO TABS
600.0000 mg | ORAL_TABLET | Freq: Four times a day (QID) | ORAL | Status: DC | PRN
  Administered 2018-09-26 – 2018-09-27 (×3): 600 mg via ORAL
  Filled 2018-09-26 (×2): qty 1

## 2018-09-26 NOTE — [facility] Group Notes (Signed)
Adult Psychoeducational Group Note  Date:  09/26/2018 Time:  10:05 PM  Group Topic/Focus:  Wrap-Up Group:   The focus of this group is to help patients review their daily goal of treatment and discuss progress on daily workbooks.  Participation Level:  Active  Participation Quality:  Appropriate and Attentive  Affect:  Appropriate  Cognitive:  Alert and Appropriate  Insight: Appropriate and Good  Engagement in Group:  Engaged  Modes of Intervention:  Discussion and Education  Additional Comments:  Pt were asked to pick a letter and a word associated with that letter, and then explain what that word means to them. Pt picked the letter S, for "support". Pt told writer that support was what their hallmates did for each other today. Everyone supported each other and because of that, they felt that they had more energy today.   Misty NettersOctavia A Jimenez 09/26/2018, 10:05 PM

## 2018-09-26 NOTE — [facility] Group Notes (Signed)
Adult Psychoeducational Group Note  Date:  09/26/2018 Time:  10:43 AM  Group Topic/Focus:  Goals Group:   The focus of this group is to help patients establish daily goals to achieve during treatment and discuss how the patient can incorporate goal setting into their daily lives to aide in recovery.  Participation Level:  Active  Participation Quality:  Resistant  Affect:  Angry  Cognitive:  Alert  Insight: Lacking  Engagement in Group:  Defensive  Modes of Intervention:  Clarification  Additional Comments: Pt was very agitated and angry about program structure and continued to be disruptive along with causing other patients to get off track with group topic.  Gar GibbonMichael  Ethelwyn Gilbertson 09/26/2018, 10:43 AM

## 2018-09-26 NOTE — Plan of Care (Signed)
  Problem: Activity: Goal: Will identify at least one activity in which they can participate Outcome: Progressing   
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
## 2018-09-26 NOTE — [facility] Group Notes (Signed)
[facility] LCSW Group Therapy Note  09/26/2018  10:00-11:00AM  Type of Therapy and Topic:  Group Therapy:  Adding Supports Including Being Your Own Support  Participation Level:  Did Not Attend   Description of Group:  Patients in this group were introduced to the concept that additional supports including self-support are an essential part of recovery.  A song entitled "I Need Help!" was played and a group discussion was held in reaction to the idea of needing to add supports.  A song entitled "My Own Hero" was played and a group discussion ensued in which patients stated they could relate to the song and it inspired them to realize they have be willing to help themselves in order to succeed, because other people cannot achieve sobriety or stability for them.  We discussed adding a variety of healthy supports to address the various needs in their lives.  A song was played called "I Know Where I've Been" toward the end of group and used to conduct an inspirational wrap-up to group of remembering how far they have already come in their journey.  Therapeutic Goals: 1)  demonstrate the importance of being a part of one's own support system 2)  discuss reasons people in one's life may eventually be unable to be continually supportive  3)  identify the patient's current support system and   4)  elicit commitments to add healthy supports and to become more conscious of being self-supportive   Summary of Patient Progress:  The patient did not attend.    Therapeutic Modalities:   Motivational Interviewing Activity  Ligia Duguay J Grossman-Orr       

## 2018-09-26 NOTE — Progress Notes (Signed)
[facility] Group Notes:  (Nursing/MHT/Case Management/Adjunct)  Date:  09/26/2018  Time:  1330 Type of Therapy:  Nurse Education with Patricia Duke, RN  Participation Level:  Active  Participation Quality:  Appropriate  Affect:  Appropriate  Cognitive:  Appropriate  Insight:  Appropriate  Engagement in Group:  Engaged  Modes of Intervention:  Discussion and Education     

## 2018-09-26 NOTE — Progress Notes (Signed)
D Patient presents UAL on the 400 hall today. She tolerates this well. She makes good eye contact. She says she slept " awful" last night. She takes her scheduled meds as planned. HEr affect is sad, deperessed , flat.    A She completed her daily assessment and on this she wrote she denied having SI today and she rated her depression, hopelessness and anxiety " 8/10/9", respectively. She says " what do you think about her anxiety  ( pointing to a female patient standing beside her at the med window... I told her I think she needs to take some different medicine,..that lots of times when you're not on the right medicien, your anxiety gets worse..even after you've been here and I think she needs a medicine just for her anxiety". Writer thanked patient for her kindness and caring of another. Writer encouraged patient to stay focused on her own feelings, staying in alignment with her plan of care, and staying focused on her goal for the day-    R Safety in place.

## 2018-09-26 NOTE — [facility] Counselor (Addendum)
CSW spoke with mother to provide SPE. Mother shared she is concerned with all the recent events over Thanksgiving her getting arrested, this hospitalization, her mind frame and lack of personal safety. Mother shared she is really struggling with anger issues and focusing on past events that cannot be changed. Mom reports this makes all relationships a struggle and she will take it out on the people that love her the most. Mom reports she will come for a visit today and plans to pick patient up at discharge. Mom asked if SPE pamphlets can be left for her and the grandparents at the nurses station. CSW will leave two with patient's nurse. Mom reported she is available as well as her grandparents for family therapy because they want to help her and barely recognize her. Mom reports she plans to reach out to her therapist to let her know they are willing to participate with her also. She reports she had previously let therapy be her thing as an adult but with the escalation of events plans to at least reach out.

## 2018-09-26 NOTE — Progress Notes (Signed)
D.  Pt pleasant on approach, complaint of some abdominal pain.  Pt states that she has ovarian cyst that is causing her pain, states she is afraid it might rupture.  Pt requests Ibuprofen.  Pt given medication as ordered and instructed to let staff know if she any changes in reported symptoms or level of pain.  Pt states she hopes she can just sleep.  Pt was positive for evening wrap up group with appropriate participation,and did eat snack afterwards.  Pt's skin dry to touch, color good.  Pt in room talking with roommate.  A.  Support and encouragement offered, medication given as ordered.  R.  Pt remains safe on the unit, will continue to monitor.

## 2018-09-26 NOTE — [facility] Suicide Risk Assessment (Signed)
[facility] INPATIENT:  Family/Significant Other Suicide Prevention Education  Suicide Prevention Education:  Education Completed; Misty Jimenez Mother at [phone],  (name of family member/significant other) has been identified by the patient as the family member/significant other with whom the patient will be residing, and identified as the person(s) who will aid the patient in the event of a mental health crisis (suicidal ideations/suicide attempt).  With written consent from the patient, the family member/significant other has been provided the following suicide prevention education, prior to the and/or following the discharge of the patient.  The suicide prevention education provided includes the following:  Suicide risk factors  Suicide prevention and interventions  National Suicide Hotline telephone number  [facility] assessment telephone number  [facility] Emergency Assistance 911  [facility] and/or Residential Mobile Crisis Unit telephone number  Request made of family/significant other to:  Remove weapons (e.g., guns, rifles, knives), all items previously/currently identified as safety concern.    Remove drugs/medications (over-the-counter, prescriptions, illicit drugs), all items previously/currently identified as a safety concern.  The family member/significant other verbalizes understanding of the suicide prevention education information provided.  The family member/significant other agrees to remove the items of safety concern listed above.  Mother reports there are guns [place] where she will likely be going because she does not want to stay with mom 3 hours away. Mother shared she will share this information and give them a pamphlet also as well as ensure they know to keep the gun safe is locked at all times.   Misty Jimenez 09/26/2018, 11:39 AM

## 2018-09-26 NOTE — Progress Notes (Signed)
NUTRITION ASSESSMENT  Pt identified as at risk on the Malnutrition Screen Tool  INTERVENTION: 1. Encourage PO intakes  NUTRITION DIAGNOSIS: Unintentional weight loss related to sub-optimal intake as evidenced by pt report.   Goal: Pt to meet >/= 90% of their estimated nutrition needs.  Monitor:  PO intake  Assessment:  Pt admitted with depression. Pt with history of seeing an outpatient RD for obesity and binge-eating behaviors. Per weight records, pt has lost 14 lb since 10/2, 6% wt loss x 2 months, significant for time frame. Ensure supplements have been ordered.   Height: Ht Readings from Last 1 Encounters:  09/24/18 5\' 7"  (1.702 m)    Weight: Wt Readings from Last 1 Encounters:  09/24/18 107.5 kg    Weight Hx: Wt Readings from Last 10 Encounters:  09/24/18 107.5 kg  09/24/18 108.9 kg  09/10/18 111.6 kg  08/17/18 113.8 kg  07/28/18 114.1 kg  07/03/17 120.2 kg (>99 %, Z= 2.59)*  11/04/16 126.4 kg (>99 %, Z= 2.63)*  06/18/16 135.3 kg (>99 %, Z= 2.71)*  05/07/16 132.6 kg (>99 %, Z= 2.68)*  03/05/15 126.7 kg (>99 %, Z= 2.65)*   * Growth percentiles are based on CDC (Girls, 2-20 Years) data.    BMI:  Body mass index is 37.12 kg/m. Pt meets criteria for obesity based on current BMI.  Estimated Nutritional Needs: Kcal: 25-30 kcal/kg Protein: > 1 gram protein/kg Fluid: 1 ml/kcal  Diet Order:  Diet Order            Diet regular Room service appropriate? Yes; Fluid consistency: Thin  Diet effective now             Pt is also offered choice of unit snacks mid-morning and mid-afternoon.  Pt is eating as desired.   Lab results and medications reviewed.   Tilda FrancoLindsey Davia Smyre, MS, RD, LDN [facility] Clinical Dietitian Pager: [phone] After Hours Pager: [phone]

## 2018-09-27 DIAGNOSIS — F4310 Post-traumatic stress disorder, unspecified: Secondary | ICD-10-CM | POA: Diagnosis not present

## 2018-09-27 DIAGNOSIS — G4700 Insomnia, unspecified: Secondary | ICD-10-CM | POA: Diagnosis not present

## 2018-09-27 DIAGNOSIS — F419 Anxiety disorder, unspecified: Secondary | ICD-10-CM | POA: Diagnosis not present

## 2018-09-27 DIAGNOSIS — F323 Major depressive disorder, single episode, severe with psychotic features: Secondary | ICD-10-CM | POA: Diagnosis not present

## 2018-09-27 MED ORDER — NICOTINE 21 MG/24HR TD PT24
21.0000 mg | MEDICATED_PATCH | Freq: Every day | TRANSDERMAL | Status: DC
  Administered 2018-09-27 – 2018-09-28 (×2): 21 mg via TRANSDERMAL
  Filled 2018-09-27 (×2): qty 1

## 2018-09-27 MED ORDER — ZOLPIDEM TARTRATE 5 MG PO TABS
5.0000 mg | ORAL_TABLET | Freq: Every evening | ORAL | Status: DC | PRN
  Administered 2018-09-27: 5 mg via ORAL
  Filled 2018-09-27: qty 1

## 2018-09-27 MED ORDER — NICOTINE POLACRILEX 2 MG MT GUM
2.0000 mg | CHEWING_GUM | OROMUCOSAL | Status: DC | PRN
  Administered 2018-09-27: 2 mg via ORAL
  Filled 2018-09-27: qty 1

## 2018-09-27 MED ORDER — ARIPIPRAZOLE 10 MG PO TABS
10.0000 mg | ORAL_TABLET | Freq: Every day | ORAL | Status: DC
  Filled 2018-09-27: qty 1

## 2018-09-27 MED ORDER — ARIPIPRAZOLE 5 MG PO TABS
5.0000 mg | ORAL_TABLET | Freq: Every day | ORAL | Status: DC
  Administered 2018-09-28: 5 mg via ORAL
  Filled 2018-09-27 (×2): qty 1

## 2018-09-27 NOTE — [facility] Group Notes (Signed)
Adult Psychoeducational Group Note  Date:  09/27/2018 Time:  9:22 PM  Group Topic/Focus:  Wrap-Up Group:   The focus of this group is to help patients review their daily goal of treatment and discuss progress on daily workbooks.  Participation Level:  Active  Participation Quality:  Appropriate and Attentive  Affect:  Appropriate  Cognitive:  Alert and Appropriate  Insight: Appropriate and Good  Engagement in Group:  Engaged  Modes of Intervention:  Discussion and Education  Additional Comments:  Pt attended and participated in wrap up group this evening. Pt rated their day a 7.5/10, due to them having a good visit and talking to the pt on their hall. Pt was not able to complete their goal of having more energy. Pt told writer that [facility] having all the scheduled groups, it effected their goal.   Chrisandra NettersOctavia A Cambria 09/27/2018, 9:22 PM

## 2018-09-27 NOTE — Tx Team (Signed)
Interdisciplinary Treatment and Diagnostic Plan Update  09/27/2018 Time of Session: 9:00am Misty Jimenez MRN: [id number]  Principal Diagnosis: <principal problem not specified>  Secondary Diagnoses: Active Problems:   Severe recurrent major depression without psychotic features (HCC)   Current Medications:  Current Facility-Administered Medications  Medication Dose Route Frequency Provider Last Rate Last Dose  . albuterol (PROVENTIL HFA;VENTOLIN HFA) 108 (90 Base) MCG/ACT inhaler 2 puff  2 puff Inhalation Q6H PRN Lindon Romp A, NP      . alum & mag hydroxide-simeth (MAALOX/MYLANTA) 200-200-20 MG/5ML suspension 30 mL  30 mL Oral Q4H PRN Lindon Romp A, NP   30 mL at 09/26/18 1827  . ARIPiprazole (ABILIFY) tablet 5 mg  5 mg Oral Daily Cobos, Myer Peer, MD   5 mg at 09/27/18 0811  . feeding supplement (ENSURE ENLIVE) (ENSURE ENLIVE) liquid 237 mL  237 mL Oral BID BM Lindon Romp A, NP   237 mL at 09/26/18 1658  . FLUoxetine (PROZAC) capsule 20 mg  20 mg Oral Daily Cobos, Myer Peer, MD   20 mg at 09/27/18 0811  . hydrOXYzine (ATARAX/VISTARIL) tablet 25 mg  25 mg Oral Q6H PRN Lindon Romp A, NP   25 mg at 09/27/18 0817  . ibuprofen (ADVIL,MOTRIN) tablet 600 mg  600 mg Oral Q6H PRN Lindon Romp A, NP   600 mg at 09/27/18 0817  . magnesium hydroxide (MILK OF MAGNESIA) suspension 30 mL  30 mL Oral Daily PRN Lindon Romp A, NP      . nicotine (NICODERM CQ - dosed in mg/24 hours) patch 21 mg  21 mg Transdermal Daily Lindon Romp A, NP   21 mg at 09/27/18 6283  . zolpidem (AMBIEN) tablet 5 mg  5 mg Oral QHS PRN Cobos, Myer Peer, MD       PTA Medications: Medications Prior to Admission  Medication Sig Dispense Refill Last Dose  . albuterol (PROVENTIL HFA;VENTOLIN HFA) 108 (90 Base) MCG/ACT inhaler Inhale 2 puffs into the lungs every 6 (six) hours as needed for wheezing or shortness of breath. 2 Inhaler 1 2 weeks ago  . busPIRone (BUSPAR) 10 MG tablet Take 1 tablet (10 mg total) by mouth 3  (three) times daily. 90 tablet 1 09/22/2018 at am  . etonogestrel (NEXPLANON) 68 MG IMPL implant 1 each by Subdermal route once. Implanted July 2019   July 2019  . naproxen sodium (ALEVE) 220 MG tablet Take 440 mg by mouth 2 (two) times daily as needed (pain/headache).   month ago  . QUEtiapine (SEROQUEL) 300 MG tablet Take 1 tablet (300 mg total) by mouth at bedtime. 30 tablet 3 09/21/2018 at pm  . tiZANidine (ZANAFLEX) 4 MG tablet Take 0.5-1 tablets (2-4 mg total) by mouth 2 (two) times daily as needed for muscle spasms. 30 tablet 3 week ago    Patient Stressors: Arts development officer issue Substance abuse Traumatic event  Patient Strengths: Average or above average intelligence Capable of independent living Communication skills General fund of knowledge Motivation for treatment/growth Supportive family/friends  Treatment Modalities: Medication Management, Group therapy, Case management,  1 to 1 session with clinician, Psychoeducation, Recreational therapy.   Physician Treatment Plan for Primary Diagnosis: <principal problem not specified> Long Term Goal(s): Improvement in symptoms so as ready for discharge Improvement in symptoms so as ready for discharge   Short Term Goals: Ability to identify changes in lifestyle to reduce recurrence of condition will improve Ability to maintain clinical measurements within normal limits will improve Ability to identify triggers  associated with substance abuse/mental health issues will improve  Medication Management: Evaluate patient's response, side effects, and tolerance of medication regimen.  Therapeutic Interventions: 1 to 1 sessions, Unit Group sessions and Medication administration.  Evaluation of Outcomes: Not Met  Physician Treatment Plan for Secondary Diagnosis: Active Problems:   Severe recurrent major depression without psychotic features ([place])  Long Term Goal(s): Improvement in symptoms so as ready for  discharge Improvement in symptoms so as ready for discharge   Short Term Goals: Ability to identify changes in lifestyle to reduce recurrence of condition will improve Ability to maintain clinical measurements within normal limits will improve Ability to identify triggers associated with substance abuse/mental health issues will improve     Medication Management: Evaluate patient's response, side effects, and tolerance of medication regimen.  Therapeutic Interventions: 1 to 1 sessions, Unit Group sessions and Medication administration.  Evaluation of Outcomes: Progressing   RN Treatment Plan for Primary Diagnosis: <principal problem not specified> Long Term Goal(s): Knowledge of disease and therapeutic regimen to maintain health will improve  Short Term Goals: Ability to remain free from injury will improve, Ability to verbalize frustration and anger appropriately will improve, Ability to demonstrate self-control, Ability to participate in decision making will improve, Ability to verbalize feelings will improve and Ability to disclose and discuss suicidal ideas  Medication Management: RN will administer medications as ordered by provider, will assess and evaluate patient's response and provide education to patient for prescribed medication. RN will report any adverse and/or side effects to prescribing provider.  Therapeutic Interventions: 1 on 1 counseling sessions, Psychoeducation, Medication administration, Evaluate responses to treatment, Monitor vital signs and CBGs as ordered, Perform/monitor CIWA, COWS, AIMS and Fall Risk screenings as ordered, Perform wound care treatments as ordered.  Evaluation of Outcomes: Not Met   LCSW Treatment Plan for Primary Diagnosis: <principal problem not specified> Long Term Goal(s): Safe transition to appropriate next level of care at discharge, Engage patient in therapeutic group addressing interpersonal concerns.  Short Term Goals: Engage patient  in aftercare planning with referrals and resources, Increase social support, Increase ability to appropriately verbalize feelings, Increase emotional regulation and Increase skills for wellness and recovery  Therapeutic Interventions: Assess for all discharge needs, 1 to 1 time with Social worker, Explore available resources and support systems, Assess for adequacy in community support network, Educate family and significant other(s) on suicide prevention, Complete Psychosocial Assessment, Interpersonal group therapy.  Evaluation of Outcomes: Progressing   Progress in Treatment: Attending groups: Yes. Participating in groups: No. Patient agitated and resistant in some groups. Taking medication as prescribed: Yes. Toleration medication: Yes. Family/Significant other contact made: Yes, individual(s) contacted:  Mother, Leafy Ro Patient understands diagnosis: Yes. Discussing patient identified problems/goals with staff: Yes. Medical problems stabilized or resolved: No. Denies suicidal/homicidal ideation: Yes. Issues/concerns per patient self-inventory: No.   New problem(s) identified: No, Describe:  CSW continuing to assess  New Short Term/Long Term Goal(s): medication management for mood stabilization; elimination of SI thoughts; development of comprehensive mental wellness/sobriety plan.  Patient Goals:  "Learn to cope with anger, fear, and PTSD."  Discharge Plan or Barriers: Patient states she plans to live with her grandparents in [place] following discharge. [facility] pamphlet, [facility] information, and AA/NA information provided to patient for additional community support and resources.   Reason for Continuation of Hospitalization: Aggression Anxiety Depression Suicidal ideation  Estimated Length of Stay: 10/01/18  Attendees: Patient: Misty Jimenez 09/27/2018 8:38 AM  Physician: Larene Beach 09/27/2018 8:38 AM  Nursing: Rise Paganini 09/27/2018 8:38  AM  RN Care Manager: 09/27/2018 8:38  AM  Social Worker: Stephanie Acre, [place] 09/27/2018 8:38 AM  Recreational Therapist:  09/27/2018 8:38 AM  Other:  09/27/2018 8:38 AM  Other:  09/27/2018 8:38 AM  Other: 09/27/2018 8:38 AM    Scribe for Treatment Team: Joellen [place], [place] 09/27/2018 8:38 AM

## 2018-09-27 NOTE — [facility] Group Notes (Signed)
[facility] Group Notes:  (Nursing/MHT/Case Management/Adjunct)  Date:  09/27/2018  Time:  4:00 PM Type of Therapy:  Nurse Education  Participation Level:  Active  Participation Quality:  Appropriate  Affect:  Appropriate  Cognitive:  Alert and Appropriate  Insight:  Appropriate and Improving  Engagement in Group:  Engaged and Improving  Modes of Intervention:  Discussion and Education  Summary of Progress/Problems: Patient was appropriate and attentive during RN group.  Que Meneely 09/27/2018, 6:09 PM 

## 2018-09-27 NOTE — Plan of Care (Signed)
Nurse discussed anxiety, depression and coping skills with patient.  

## 2018-09-27 NOTE — [facility] Group Notes (Signed)
Adult Psychoeducational Group Note  Date:  09/27/2018 Time:  8:59 AM  Group Topic/Focus:  Goals Group:   The focus of this group is to help patients establish daily goals to achieve during treatment and discuss how the patient can incorporate goal setting into their daily lives to aide in recovery.  Participation Level:  Active  Participation Quality:  Appropriate  Affect:  Appropriate  Cognitive:  Appropriate  Insight: Appropriate  Engagement in Group:  Engaged  Modes of Intervention:  Orientation  Additional Comments:   Pt attended and participated in orientation/goals group facilitated by MHT Donnetta SimpersMichael A.   Shivansh Hardaway M Ahuva Poynor 09/27/2018, 8:59 AM

## 2018-09-27 NOTE — [facility] Group Notes (Signed)
LCSW Group Therapy Note 09/27/2018 12:29 PM  Type of Therapy and Topic: Group Therapy: Overcoming Obstacles  Participation Level: Active  Description of Group:  In this group patients will be encouraged to explore what they see as obstacles to their own wellness and recovery. They will be guided to discuss their thoughts, feelings, and behaviors related to these obstacles. The group will process together ways to cope with barriers, with attention given to specific choices patients can make. Each patient will be challenged to identify changes they are motivated to make in order to overcome their obstacles. This group will be process-oriented, with patients participating in exploration of their own experiences as well as giving and receiving support and challenge from other group members.  Therapeutic Goals: 1. Patient will identify personal and current obstacles as they relate to admission. 2. Patient will identify barriers that currently interfere with their wellness or overcoming obstacles.  3. Patient will identify feelings, thought process and behaviors related to these barriers. 4. Patient will identify two changes they are willing to make to overcome these obstacles:   Summary of Patient Progress  Misty Jimenez reports that her main obstacle is not having a job and experiencing multiple deaths of loved ones. Misty Jimenez reports that she wants to learn better coping skills so that she can manage these issues when she discharges from the hospital.    Therapeutic Modalities:  Cognitive Behavioral Therapy Solution Focused Therapy Motivational Interviewing Relapse Prevention Therapy   Misty Jimenez LCSWA Clinical Social Worker
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
## 2018-09-27 NOTE — Progress Notes (Signed)
Recreation Therapy Notes  Date: 12.2.19 Time: 0930 Location: 300 Hall Dayroom  Group Topic: Stress Management  Goal Area(s) Addresses:  Patient will verbalize importance of using healthy stress management.  Patient will identify positive emotions associated with healthy stress management.   Intervention: Stress Management  Activity :  Meditation.  LRT introduced the stress management technique of meditation.  LRT played Jimenez meditation dealing with impermanence.  Patients were to listen and follow along as the meditation played to engage in the technique.  Education:  Stress Management, Discharge Planning.   Education Outcome: Acknowledges edcuation/In group clarification offered/Needs additional education  Clinical Observations/Feedback: Pt did not attend group.    Misty Jimenez, LRT/CTRS         Misty Jimenez 09/27/2018 11:19 AM 

## 2018-09-27 NOTE — Progress Notes (Signed)
D:  Patient's self inventory sheet, patient has poor sleep, sleep medication not helpful.  Fair appetite, low energy level, poor concentration.  Rated depression 7, hopeless and anxiety 9.  Denied withdrawals.  Denied SI.  Physical pain, abdominal pain, PCOS, worst pain in past 24 hours #8, took ibuprofen last night.  Patient plans to try and smile, attend groups. Does have discharge plans. A:  Medications administered per MD orders.  Emotional support and encouragement given patient. R:  Denied SI and HI, contracts for safety.  Denied A/V hallucinations.  Safety maintained with 15 minute checks.

## 2018-09-28 DIAGNOSIS — G4700 Insomnia, unspecified: Secondary | ICD-10-CM | POA: Diagnosis not present

## 2018-09-28 DIAGNOSIS — F332 Major depressive disorder, recurrent severe without psychotic features: Secondary | ICD-10-CM | POA: Diagnosis not present

## 2018-09-28 DIAGNOSIS — F419 Anxiety disorder, unspecified: Secondary | ICD-10-CM | POA: Diagnosis not present

## 2018-09-28 MED ORDER — FLUOXETINE HCL 20 MG PO CAPS: 20.0000 mg | ORAL_CAPSULE | Freq: Every day | ORAL | 0 refills | Status: DC

## 2018-09-28 MED ORDER — ONDANSETRON HCL 4 MG PO TABS
4.0000 mg | ORAL_TABLET | Freq: Three times a day (TID) | ORAL | Status: DC | PRN
  Administered 2018-09-28: 4 mg via ORAL
  Filled 2018-09-28: qty 1

## 2018-09-28 MED ORDER — HYDROXYZINE HCL 25 MG PO TABS: 25.0000 mg | ORAL_TABLET | Freq: Four times a day (QID) | ORAL | 0 refills | Status: DC | PRN

## 2018-09-28 MED ORDER — NICOTINE 21 MG/24HR TD PT24: 21.0000 mg | MEDICATED_PATCH | Freq: Every day | TRANSDERMAL | 0 refills | Status: DC

## 2018-09-28 MED ORDER — ZOLPIDEM TARTRATE 5 MG PO TABS: 5.0000 mg | ORAL_TABLET | Freq: Every evening | ORAL | 0 refills | Status: DC | PRN

## 2018-09-28 MED ORDER — ARIPIPRAZOLE 5 MG PO TABS: 5.0000 mg | ORAL_TABLET | Freq: Every day | ORAL | 0 refills | Status: DC
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
## 2018-09-28 NOTE — Progress Notes (Signed)
Adult Psychoeducational Group Note  Date:  09/28/2018 Time:  8:42 AM  Group Topic/Focus:  Goals Group:   The focus of this group is to help patients establish daily goals to achieve during treatment and discuss how the patient can incorporate goal setting into their daily lives to aide in recovery.  Participation Level:  Active  Participation Quality:  Appropriate  Affect:  Irritable  Cognitive:  Appropriate  Insight: Good  Engagement in Group:  Engaged  Modes of Intervention:  Discussion  Additional Comments:  Pt attended morning goals group.    Misty Jimenez 09/28/2018, 8:42 AM

## 2018-09-28 NOTE — Plan of Care (Signed)
D: Patient is alert, oriented, and cooperative. Patient presents with anxious affect. Patient denies SI, HI, AVH, and verbally contracts for safety. Patient rates her day 7.5/10. Patient reports getting agitated about groups today. Patient reports feeling better during assessment by this Rn. Patient rates her anxiety 5/10. Patient denies physical symptoms/pain. Patient requests PRN sleep and anxiety medication.    A: Scheduled medications administered per MD order. PRN sleep and anxiety medication administered per MD order. Support provided. Patient educated on safety on the unit and medications. Routine safety checks every 15 minutes. Patient stated understanding to tell nurse about any new physical symptoms. Patient understands to tell staff of any needs.     R: No adverse drug reactions noted. Patient verbally contracts for safety. Patient remains safe at this time and will continue to monitor.   Problem: Education: Goal: Knowledge of [facility] Education information/materials will improve Outcome: Progressing   Problem: Safety: Goal: Periods of time without injury will increase Outcome: Progressing   Patient oriented to the unit. Patient remains safe and will continue to monitor.

## 2018-09-28 NOTE — [facility] Suicide Risk Assessment (Signed)
[facility]   Principal Problem: <principal problem not specified> Discharge Diagnoses: Active Problems:   Severe recurrent major depression without psychotic features (HCC)   Total Time spent with patient: 30 minutes  Musculoskeletal: Strength & Muscle Tone: within normal limits Gait & Station: normal Patient leans: N/A  Psychiatric Specialty Exam: Review of Systems  Gastrointestinal: Positive for nausea.  All other systems reviewed and are negative.   Blood pressure 120/84, pulse (!) 105, temperature 98.2 F (36.8 C), temperature source Oral, resp. rate 20, height 5\' 7"  (1.702 m), weight 107.5 kg, last menstrual period 08/01/2018.Body mass index is 37.12 kg/m.  General Appearance: Casual  Eye Contact::  Fair  Speech:  Normal Rate409  Volume:  Normal  Mood:  Anxious  Affect:  Congruent  Thought Process:  Coherent and Descriptions of Associations: Intact  Orientation:  Full (Time, Place, and Person)  Thought Content:  Logical  Suicidal Thoughts:  No  Homicidal Thoughts:  No  Memory:  Immediate;   Fair Recent;   Fair Remote;   Fair  Judgement:  Intact  Insight:  Lacking  Psychomotor Activity:  Increased  Concentration:  Fair  Recall:  Fis[employer]Fair  Fund of Knowledge:Fair  Language: Good  Akathisia:  Negative  Handed:  Right  AIMS (if indicated):     Assets:  Communication Skills Desire for Improvement Financial Resources/Insurance Housing Physical Health Resilience  Sleep:  Number of Hours: 6.75  Cognition: WNL  ADL's:  Intact   Mental Status Per Nursing Assessment::   On Admission:  Suicidal ideation indicated by patient, Self-harm thoughts  Demographic Factors:  Adolescent or young adult, Caucasian and Low socioeconomic status  Loss Factors: NA  Historical Factors: Impulsivity  Risk Reduction Factors:   Employed  Continued Clinical Symptoms:  Depression:   Comorbid alcohol abuse/dependence Impulsivity Alcohol/Substance  Abuse/Dependencies  Cognitive Features That Contribute To Risk:  Thought constriction (tunnel vision)    Suicide Risk:  Minimal: No identifiable suicidal ideation.  Patients presenting with no risk factors but with morbid ruminations; may be classified as minimal risk based on the severity of the depressive symptoms  Follow-up Information    [facility]. Go on 10/06/2018.   Why:  Your next appointment with your therapist [name] is Wednesday, 10/06/18.  Contact information: [address] [place], [place] [address] Phone: [phone]       [facility] Go on 09/28/2018.   Contact information: [address] [place] [address] [phone]           Plan Of Care/Follow-up recommendations:  Activity:  ad lib  Antonieta PertGreg Lawson Joey Lierman, MD 09/28/2018, 9:20 AM

## 2018-09-28 NOTE — Progress Notes (Signed)
Nursing discharge note: Patient discharged home per MD order.  Patient received all personal belongings from unit and locker.  Reviewed AVS/transition record with patient and she indicates understanding.  Patient will follow up with .  Patient denies any thoughts of self harm. She left ambulatory with family members.

## 2018-09-28 NOTE — Plan of Care (Signed)
  Problem: Activity: Goal: Will identify at least one activity in which they can participate Outcome: Completed/Met   Problem: Coping: Goal: Ability to identify and develop effective coping behavior will improve Outcome: Completed/Met Goal: Ability to interact with others will improve Outcome: Completed/Met Goal: Demonstration of participation in decision-making regarding own care will improve Outcome: Completed/Met Goal: Ability to use eye contact when communicating with others will improve Outcome: Completed/Met   Problem: Health Behavior/Discharge Planning: Goal: Identification of resources available to assist in meeting health care needs will improve Outcome: Completed/Met   Problem: Self-Concept: Goal: Will verbalize positive feelings about self Outcome: Completed/Met   Problem: Education: Goal: Knowledge of [facility] information/materials will improve Outcome: Completed/Met Goal: Emotional status will improve Outcome: Completed/Met Goal: Mental status will improve Outcome: Completed/Met Goal: Verbalization of understanding the information provided will improve Outcome: Completed/Met   Problem: Activity: Goal: Interest or engagement in activities will improve Outcome: Completed/Met Goal: Sleeping patterns will improve Outcome: Completed/Met   Problem: Coping: Goal: Ability to verbalize frustrations and anger appropriately will improve Outcome: Completed/Met Goal: Ability to demonstrate self-control will improve Outcome: Completed/Met   Problem: Health Behavior/Discharge Planning: Goal: Identification of resources available to assist in meeting health care needs will improve Outcome: Completed/Met Goal: Compliance with treatment plan for underlying cause of condition will improve Outcome: Completed/Met   Problem: Physical Regulation: Goal: Ability to maintain clinical measurements within normal limits will improve Outcome: Completed/Met    Problem: Safety: Goal: Periods of time without injury will increase Outcome: Completed/Met   Problem: Education: Goal: Utilization of techniques to improve thought processes will improve Outcome: Completed/Met Goal: Knowledge of the prescribed therapeutic regimen will improve Outcome: Completed/Met   Problem: Activity: Goal: Interest or engagement in leisure activities will improve Outcome: Completed/Met Goal: Imbalance in normal sleep/wake cycle will improve Outcome: Completed/Met   Problem: Coping: Goal: Coping ability will improve Outcome: Completed/Met Goal: Will verbalize feelings Outcome: Completed/Met   Problem: Health Behavior/Discharge Planning: Goal: Ability to make decisions will improve Outcome: Completed/Met Goal: Compliance with therapeutic regimen will improve Outcome: Completed/Met   Problem: Role Relationship: Goal: Will demonstrate positive changes in social behaviors and relationships Outcome: Completed/Met   Problem: Safety: Goal: Ability to disclose and discuss suicidal ideas will improve Outcome: Completed/Met Goal: Ability to identify and utilize support systems that promote safety will improve Outcome: Completed/Met   Problem: Self-Concept: Goal: Will verbalize positive feelings about self Outcome: Completed/Met Goal: Level of anxiety will decrease Outcome: Completed/Met   Problem: Education: Goal: Knowledge of disease or condition will improve Outcome: Completed/Met Goal: Understanding of discharge needs will improve Outcome: Completed/Met   Problem: Health Behavior/Discharge Planning: Goal: Ability to identify changes in lifestyle to reduce recurrence of condition will improve Outcome: Completed/Met Goal: Identification of resources available to assist in meeting health care needs will improve Outcome: Completed/Met   Problem: Physical Regulation: Goal: Complications related to the disease process, condition or treatment will be  avoided or minimized Outcome: Completed/Met   Problem: Safety: Goal: Ability to remain free from injury will improve Outcome: Completed/Met

## 2018-09-28 NOTE — Progress Notes (Signed)
  [facility]  Will you be returning to the same living situation after discharge:  Yes,  patient reports she was returning home with her grandparents At discharge, do you have transportation home?: Yes,  patient reports her mother and grandfather were picking her up at discharge Do you have the ability to pay for your medications: Yes,  [employer], support from parents  Release of information consent forms completed and in the chart;  Patient's signature needed at discharge.  Patient to Follow up at: Follow-up Information    [facility]. Go on 10/06/2018.   Why:  Your next appointment with your therapist [name] is Wednesday, 10/06/18.  Contact information: [address] [place], [place] [address] Phone: [phone]       [facility]. Go on 10/08/2018.   Why:  Please attend your intake appointment on Friday, 10/08/18 at 2:00p. Your medication management appointment is Monday, 10/11/18 at 8:30a.  Please call within 24 hours of discharge to secure appointment and pay $20 deposit.  Contact information: [address]  [place] [address] [phone]           Next level of care provider [facility]:yes  Safety Planning and Suicide Prevention discussed: Yes,  with the patient's mother  Have you used any form of tobacco in the last 30 days? (Cigarettes, Smokeless Tobacco, Cigars, and/or Pipes): Yes  Has patient been referred to the Quitline?: Patient refused referral  Patient has been referred for addiction treatment: N/A  Maeola SarahJolan E Jimenez, LCSWA 09/28/2018, 2:49 PM

## 2018-10-03 ENCOUNTER — Ambulatory Visit (HOSPITAL_COMMUNITY)
Admission: RE | Admit: 2018-10-03 | Discharge: 2018-10-03 | Disposition: A | Discharge status: Home / Self Care | Payer: 59 | Attending: Psychiatry | Admitting: Psychiatry

## 2018-10-03 ENCOUNTER — Encounter (HOSPITAL_COMMUNITY): Payer: Self-pay

## 2018-10-03 ENCOUNTER — Other Ambulatory Visit: Payer: Self-pay

## 2018-10-03 ENCOUNTER — Inpatient Hospital Stay (HOSPITAL_COMMUNITY)
Admission: RE | Admit: 2018-10-03 | Discharge: 2018-10-05 | DRG: 885 | Disposition: A | Discharge status: Home / Self Care | Payer: 59 | Attending: Psychiatry | Admitting: Psychiatry

## 2018-10-03 DIAGNOSIS — G4700 Insomnia, unspecified: Secondary | ICD-10-CM | POA: Diagnosis present

## 2018-10-03 DIAGNOSIS — F339 Major depressive disorder, recurrent, unspecified: Secondary | ICD-10-CM | POA: Insufficient documentation

## 2018-10-03 DIAGNOSIS — Z8249 Family history of ischemic heart disease and other diseases of the circulatory system: Secondary | ICD-10-CM | POA: Diagnosis not present

## 2018-10-03 DIAGNOSIS — Z915 Personal history of self-harm: Secondary | ICD-10-CM

## 2018-10-03 DIAGNOSIS — R45851 Suicidal ideations: Secondary | ICD-10-CM

## 2018-10-03 DIAGNOSIS — Z560 Unemployment, unspecified: Secondary | ICD-10-CM

## 2018-10-03 DIAGNOSIS — R51 Headache: Secondary | ICD-10-CM | POA: Diagnosis present

## 2018-10-03 DIAGNOSIS — Z841 Family history of disorders of kidney and ureter: Secondary | ICD-10-CM

## 2018-10-03 DIAGNOSIS — M79671 Pain in right foot: Secondary | ICD-10-CM | POA: Diagnosis present

## 2018-10-03 DIAGNOSIS — Z811 Family history of alcohol abuse and dependence: Secondary | ICD-10-CM

## 2018-10-03 DIAGNOSIS — Z888 Allergy status to other drugs, medicaments and biological substances status: Secondary | ICD-10-CM | POA: Diagnosis not present

## 2018-10-03 DIAGNOSIS — F419 Anxiety disorder, unspecified: Secondary | ICD-10-CM

## 2018-10-03 DIAGNOSIS — E785 Hyperlipidemia, unspecified: Secondary | ICD-10-CM | POA: Diagnosis present

## 2018-10-03 DIAGNOSIS — F17210 Nicotine dependence, cigarettes, uncomplicated: Secondary | ICD-10-CM | POA: Diagnosis present

## 2018-10-03 DIAGNOSIS — Z882 Allergy status to sulfonamides status: Secondary | ICD-10-CM | POA: Diagnosis not present

## 2018-10-03 DIAGNOSIS — F909 Attention-deficit hyperactivity disorder, unspecified type: Secondary | ICD-10-CM | POA: Diagnosis present

## 2018-10-03 DIAGNOSIS — R52 Pain, unspecified: Secondary | ICD-10-CM

## 2018-10-03 DIAGNOSIS — Z818 Family history of other mental and behavioral disorders: Secondary | ICD-10-CM | POA: Diagnosis not present

## 2018-10-03 DIAGNOSIS — F332 Major depressive disorder, recurrent severe without psychotic features: Principal | ICD-10-CM | POA: Diagnosis present

## 2018-10-03 DIAGNOSIS — F322 Major depressive disorder, single episode, severe without psychotic features: Secondary | ICD-10-CM | POA: Diagnosis not present

## 2018-10-03 DIAGNOSIS — F4310 Post-traumatic stress disorder, unspecified: Secondary | ICD-10-CM | POA: Diagnosis present

## 2018-10-03 DIAGNOSIS — Z833 Family history of diabetes mellitus: Secondary | ICD-10-CM | POA: Diagnosis not present

## 2018-10-03 DIAGNOSIS — Z79899 Other long term (current) drug therapy: Secondary | ICD-10-CM | POA: Diagnosis not present

## 2018-10-03 LAB — URINALYSIS, COMPLETE (UACMP) WITH MICROSCOPIC
Bacteria, UA: NONE SEEN
Bilirubin Urine: NEGATIVE
Glucose, UA: NEGATIVE mg/dL
Hgb urine dipstick: NEGATIVE
Ketones, ur: NEGATIVE mg/dL
Leukocytes, UA: NEGATIVE
Nitrite: NEGATIVE
PH: 6 (ref 5.0–8.0)
Protein, ur: NEGATIVE mg/dL
RBC / HPF: NONE SEEN RBC/hpf (ref 0–5)
Specific Gravity, Urine: >1.03 — ABNORMAL HIGH (ref 1.005–1.030)
WBC, UA: NONE SEEN WBC/hpf (ref 0–5)

## 2018-10-03 LAB — RAPID URINE DRUG SCREEN, HOSP PERFORMED
Amphetamines: NOT DETECTED
Barbiturates: NOT DETECTED
Benzodiazepines: NOT DETECTED
Cocaine: NOT DETECTED
Opiates: NOT DETECTED
Tetrahydrocannabinol: POSITIVE — AB

## 2018-10-03 LAB — PREGNANCY, URINE: Preg Test, Ur: NEGATIVE

## 2018-10-03 MED ORDER — NICOTINE 21 MG/24HR TD PT24
21.0000 mg | MEDICATED_PATCH | Freq: Every day | TRANSDERMAL | Status: DC
  Administered 2018-10-03 – 2018-10-04 (×2): 21 mg via TRANSDERMAL
  Filled 2018-10-03 (×3): qty 1

## 2018-10-03 MED ORDER — ALUM & MAG HYDROXIDE-SIMETH 200-200-20 MG/5ML PO SUSP: 30.0000 mL | ORAL | Status: DC | PRN

## 2018-10-03 MED ORDER — ARIPIPRAZOLE 10 MG PO TABS
10.0000 mg | ORAL_TABLET | Freq: Every day | ORAL | Status: DC
  Filled 2018-10-03: qty 1

## 2018-10-03 MED ORDER — ZOLPIDEM TARTRATE 5 MG PO TABS: 5.0000 mg | ORAL_TABLET | Freq: Every evening | ORAL | Status: DC | PRN

## 2018-10-03 MED ORDER — ARIPIPRAZOLE 5 MG PO TABS
5.0000 mg | ORAL_TABLET | Freq: Every day | ORAL | Status: DC
  Administered 2018-10-03: 5 mg via ORAL
  Filled 2018-10-03 (×3): qty 1

## 2018-10-03 MED ORDER — FLUOXETINE HCL 20 MG PO CAPS
20.0000 mg | ORAL_CAPSULE | Freq: Every day | ORAL | Status: DC
  Administered 2018-10-04 – 2018-10-05 (×2): 20 mg via ORAL
  Filled 2018-10-03 (×4): qty 1

## 2018-10-03 MED ORDER — ACETAMINOPHEN 325 MG PO TABS: 650.0000 mg | ORAL_TABLET | Freq: Four times a day (QID) | ORAL | Status: DC | PRN

## 2018-10-03 MED ORDER — HYDROXYZINE HCL 25 MG PO TABS
25.0000 mg | ORAL_TABLET | Freq: Three times a day (TID) | ORAL | Status: DC | PRN
  Administered 2018-10-03 – 2018-10-04 (×4): 25 mg via ORAL
  Filled 2018-10-03 (×4): qty 1

## 2018-10-03 MED ORDER — MAGNESIUM HYDROXIDE 400 MG/5ML PO SUSP: 30.0000 mL | Freq: Every day | ORAL | Status: DC | PRN

## 2018-10-03 MED ORDER — MIRTAZAPINE 7.5 MG PO TABS
7.5000 mg | ORAL_TABLET | Freq: Every day | ORAL | Status: DC
  Administered 2018-10-03 – 2018-10-04 (×2): 7.5 mg via ORAL
  Filled 2018-10-03 (×4): qty 1

## 2018-10-03 MED ORDER — IBUPROFEN 400 MG PO TABS
400.0000 mg | ORAL_TABLET | Freq: Four times a day (QID) | ORAL | Status: DC | PRN
  Administered 2018-10-03 – 2018-10-04 (×3): 400 mg via ORAL
  Filled 2018-10-03 (×3): qty 1

## 2018-10-03 MED ORDER — ARIPIPRAZOLE 5 MG PO TABS
5.0000 mg | ORAL_TABLET | Freq: Every day | ORAL | Status: DC
  Administered 2018-10-04: 5 mg via ORAL
  Filled 2018-10-03 (×2): qty 1

## 2018-10-03 NOTE — H&P (Signed)
Behavioral Health Medical Screening Exam  [name] is an 22 y.o. female.  Total Time spent with patient: 20 minutes  Psychiatric Specialty Exam: Physical Exam  Nursing note and vitals reviewed. Constitutional: She is oriented to person, place, and time. She appears well-developed and well-nourished.  Cardiovascular: Normal rate.  Respiratory: Effort normal.  Musculoskeletal: Normal range of motion.  Neurological: She is alert and oriented to person, place, and time.  Skin: Skin is warm.    Review of Systems  Constitutional: Negative.   HENT: Negative.   Eyes: Negative.   Respiratory: Negative.   Cardiovascular: Negative.   Gastrointestinal: Negative.   Genitourinary: Negative.   Musculoskeletal: Negative.   Skin: Negative.   Neurological: Negative.   Endo/Heme/Allergies: Negative.   Psychiatric/Behavioral: Positive for depression and suicidal ideas.    Blood pressure 130/87, pulse 91, temperature 98.6 F (37 C), resp. rate 18.There is no height or weight on file to calculate BMI.  General Appearance: Casual  Eye Contact:  Good  Speech:  Clear and Coherent and Normal Rate  Volume:  Normal  Mood:  Depressed  Affect:  Flat  Thought Process:  Coherent and Descriptions of Associations: Intact  Orientation:  Full (Time, Place, and Person)  Thought Content:  WDL  Suicidal Thoughts:  Yes.  with intent/plan  Homicidal Thoughts:  No  Memory:  Immediate;   Good Recent;   Good Remote;   Good  Judgement:  Fair  Insight:  Good  Psychomotor Activity:  Normal  Concentration: Concentration: Good and Attention Span: Good  Recall:  Good  Fund of Knowledge:Good  Language: Good  Akathisia:  No  Handed:  Right  AIMS (if indicated):     Assets:  Communication Skills Desire for Improvement Financial Resources/Insurance Housing Physical Health Social Support Transportation  Sleep:       Musculoskeletal: Strength & Muscle Tone: within normal limits Gait & Station:  normal Patient leans: N/A  Blood pressure 130/87, pulse 91, temperature 98.6 F (37 C), resp. rate 18.  Recommendations:  Based on my evaluation the patient does not appear to have an emergency medical condition.  Gerlene Burdockravis B Baltasar Twilley, FNP 10/03/2018, 1:13 PM
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
## 2018-10-03 NOTE — BH Assessment (Signed)
Assessment Note  Misty Jimenez is an 22 y.o. female who was brought to [facility] walk in by maternal [employer].  Patient presented with luggage and reports she wanted to be readmitted after requesting to leave on 09-28-2018 because "the groups were not good."  Patient also called [facility] 2x this morning reporting wanting to come back .  Patient presented orientated x4, mood "depressed, anxious, and suicidal", affect flat and depressed.  Patient reports current SI with a plan to overdose on pills, however not prescribed Prozac.  Patient denied current HI or AVH.  Patient reports this is the anniversary of breaking up with Ex-Boy Friend and getting pregnant so they could start a family.  Patient reports the following depressive symptoms; feeling worthless, irritable and angry, self isolating, tearfulness, and feeling anxious.  Patient reports smoking Cannabis 1 to 2x per week of a bowl each occasion.  She denied smoking Cannabis since last hospital discharge and also denied any other illicit substance use.  Patient reports seeing Therapist Jacinto Reap in [place] with the next scheduled appointment on 10-06-2018.  Patient reports taking prescribed Prozac.  Patient's maternal [employer], Ron and [facility], provided collateral information.  Mr. And Mrs. Davonna Belling reports the Patient is very closed to her Mother who lives out of state and left yesterday.  They also reports she sits in her room and does nothing.  Mr. And Mrs. Davonna Belling reports the Patient appears bored and lonely.  Patient was hospitalized at [facility]in 08-2018 to 09-2018 for MDD.    Per Reola Calkins, NP;  Patient meets inpatient criteria and will be admitted to [facility]      Diagnosis: Major Depressive Disorder, recurrent, severe without psychosis  Past Medical History:  Past Medical History:  Diagnosis Date  . Abdominal pain, chronic, right lower quadrant 08/03/2013  . Abdominal pain, recurrent    With Headache  . Acute pyelonephritis  05/07/2016   kidney surgery at 83 months of age  . ADHD (attention deficit hyperactivity disorder)   . Allergy   . Anxiety   . Depression   . Endometriosis   . Family history of adverse reaction to anesthesia    "grandmother gets PONV"  . GE reflux 12/16/2011  . Hyperlipidemia   . Kidney disease 05/07/2016  . PCOS (polycystic ovarian syndrome)   . Preventative health care 11/04/2016  . Reflux, vesicoureteral   . Wrist pain, left 11/04/2016    Past Surgical History:  Procedure Laterality Date  . KIDNEY SURGERY     As a small child  . TYMPANOSTOMY TUBE PLACEMENT    . URETER SURGERY    . WRIST SURGERY Left 05/2016   10 pins and 2 plates    Family History:  Family History  Problem Relation Age of Onset  . Kidney disease Mother   . Migraines Maternal Aunt   . Diabetes Maternal Grandfather   . Hyperlipidemia Other   . Hypertension Other   . Depression Other   . Alcohol abuse Father        and drug use, heroin, cocaine, marijuana  . Cancer Paternal Aunt        colon in 76s deceased    Social History:  reports that she has been smoking cigarettes. She has smoked for the past 0.50 years. She has quit using smokeless tobacco.  Her smokeless tobacco use included chew. She reports that she drinks about 2.0 - 5.0 standard drinks of alcohol per week. She reports that she has current or past drug  history. Drug: Marijuana.  Additional Social History:  Substance #1 Name of Substance 1: marijuana 1 - Age of First Use: 19 1 - Amount (size/oz): bowl 1 - Frequency: 1 to 2x per week 1 - Duration: ongoing 1 - Last Use / Amount: 09/23/18  CIWA: CIWA-Ar BP: 130/87 Pulse Rate: 91 COWS:    Allergies:  Allergies  Allergen Reactions  . Sulfa Antibiotics Hives and Rash  . Sulfasalazine Hives  . Other     Fiberglass cast caused rash and hives  . Monosodium Glutamate Nausea And Vomiting, Rash and Other (See Comments)    Headache and dizziness  . Sulfa Drugs Cross Reactors Rash    Home  Medications:  (Not in a hospital admission)  OB/GYN Status:  No LMP recorded. Patient has had an implant.  General Assessment Data Location of Assessment: [facility] Assessment Services TTS Assessment: In system Is this a Tele or Face-to-Face Assessment?: Face-to-Face Is this an Initial Assessment or a Re-assessment for this encounter?: Initial Assessment Patient Accompanied by:: Adult Permission Given to speak with another: Yes Name, Relationship and Phone Number: Grand Parents Cordelia Pen and Marylyn Ishihara Language Other than English: No Living Arrangements: Other (Comment)(with Maternal Grand Parents) What gender do you identify as?: Female Marital status: Single Maiden name: Perret Pregnancy Status: No Living Arrangements: Other relatives(Maternal Grand Parents ) Can pt return to current living arrangement?: Yes Admission Status: Voluntary Is patient capable of signing voluntary admission?: Yes Referral Source: Self/Family/Friend Insurance type: [place] [employer]  Medical Screening Exam [facility] Walk-in ONLY) Medical Exam completed: No Reason for MSE not completed: Other:([facility] walk in)  Crisis Care Plan Living Arrangements: Other relatives(Maternal Grand Parents ) Name of Psychiatrist: [facility] Name of Therapist: [facility](Joanne Governale in [place])  Education Status Is patient currently in school?: No Highest grade of school patient has completed: High school Is the patient employed, unemployed or receiving disability?: Unemployed  Risk to self with the past 6 months Suicidal Ideation: Yes-Currently Present Has patient been a risk to self within the past 6 months prior to admission? : Yes Suicidal Intent: Yes-Currently Present Has patient had any suicidal intent within the past 6 months prior to admission? : Yes Is patient at risk for suicide?: Yes Suicidal Plan?: Yes-Currently Present Has patient had any suicidal  plan within the past 6 months prior to admission? : Yes Specify Current Suicidal Plan: Overdose on pills Access to Means: No Specify Access to Suicidal Means: Patient   currently in safe environment What has been your use of drugs/alcohol within the last 12 months?: Cannabis 2x per week of a bowl each occassion Previous Attempts/Gestures: Yes How many times?: 1 Other Self Harm Risks: No Triggers for Past Attempts: Anniversary(of breaking up with Boy Friend and not having children) Intentional Self Injurious Behavior: None Family Suicide History: No Recent stressful life event(s): Other (Comment)(boredom and lonilness) Persecutory voices/beliefs?: No Depression: Yes Depression Symptoms: Feeling worthless/self pity, Feeling angry/irritable, Tearfulness, Isolating Substance abuse history and/or treatment for substance abuse?: Yes Suicide prevention information given to non-admitted patients: Not applicable  Risk to Others within the past 6 months Homicidal Ideation: No Does patient have any lifetime risk of violence toward others beyond the six months prior to admission? : No Thoughts of Harm to Others: No Current Homicidal Intent: No Current Homicidal Plan: No Access to Homicidal Means: No History of harm to others?: No Assessment of Violence: None Noted Does patient have access to weapons?: No Criminal Charges Pending?: No Does  patient have a court date: No Is patient on probation?: No  Psychosis Hallucinations: None noted Delusions: None noted  Mental Status Report Appearance/Hygiene: Unremarkable Eye Contact: Fair Motor Activity: Unremarkable Speech: Logical/coherent Level of Consciousness: Alert Mood: Depressed, Anxious Affect: Appropriate to circumstance Anxiety Level: None Thought Processes: Coherent, Relevant Judgement: Impaired Orientation: Person, Place, Situation, Appropriate for developmental age, Time Obsessive Compulsive Thoughts/Behaviors: None  Cognitive  Functioning Concentration: Normal Memory: Recent Intact, Remote Intact Is patient IDD: No Insight: Poor Impulse Control: Poor Appetite: Good Have you had any weight changes? : No Change Sleep: No Change Vegetative Symptoms: None  ADLScreening [facility] Assessment Services) Patient's cognitive ability adequate to safely complete daily activities?: Yes Patient able to express need for assistance with ADLs?: Yes Independently performs ADLs?: Yes (appropriate for developmental age)  Prior Inpatient Therapy Prior Inpatient Therapy: Yes Prior Therapy Dates: 09-24-2018 to0 09-28-2018 [facility] Reason for Treatment: MDD and SI  Prior Outpatient Therapy Prior Outpatient Therapy: Yes Prior Therapy Dates: current Prior Therapy Facilty/Provider(s): [facility] Reason for Treatment: Depression Does patient have an ACCT team?: No Does patient have Intensive In-House Services?  : No Does patient have [facility] services? : No Does patient have P4CC services?: No  ADL Screening (condition at time of admission) Patient's cognitive ability adequate to safely complete daily activities?: Yes Is the patient deaf or have difficulty hearing?: No Does the patient have difficulty seeing, even when wearing glasses/contacts?: No Does the patient have difficulty concentrating, remembering, or making decisions?: No Patient able to express need for assistance with ADLs?: Yes Does the patient have difficulty dressing or bathing?: No Independently performs ADLs?: Yes (appropriate for developmental age) Does the patient have difficulty walking or climbing stairs?: No Weakness of Legs: None Weakness of Arms/Hands: None  Home Assistive Devices/Equipment Home Assistive Devices/Equipment: None    Abuse/Neglect Assessment (Assessment to be complete while patient is alone) Physical Abuse: Denies Verbal Abuse: Denies Sexual Abuse: Denies Exploitation of  patient/patient's resources: Denies Values / Beliefs Cultural Requests During Hospitalization: None Spiritual Requests During Hospitalization: None   Advance Directives (For Healthcare) Does Patient Have a Medical Advance Directive?: No(Patient declined)          Disposition:  Disposition Initial Assessment Completed for this Encounter: Yes Disposition of Patient: Admit Type of inpatient treatment program: Adult  On Site Evaluation by:   Reviewed with Physician:    Dey-Johnson,Keylin Podolsky 10/03/2018 1:23 PM

## 2018-10-04 ENCOUNTER — Ambulatory Visit (HOSPITAL_COMMUNITY): Payer: 59

## 2018-10-04 DIAGNOSIS — F332 Major depressive disorder, recurrent severe without psychotic features: Principal | ICD-10-CM

## 2018-10-04 LAB — COMPREHENSIVE METABOLIC PANEL
ALT: 8 U/L (ref 0–44)
AST: 17 U/L (ref 15–41)
Albumin: 4.1 g/dL (ref 3.5–5.0)
Alkaline Phosphatase: 80 U/L (ref 38–126)
Anion gap: 9 (ref 5–15)
BUN: 11 mg/dL (ref 6–20)
CO2: 24 mmol/L (ref 22–32)
Calcium: 9.2 mg/dL (ref 8.9–10.3)
Chloride: 106 mmol/L (ref 98–111)
Creatinine, Ser: 0.6 mg/dL (ref 0.44–1.00)
GFR calc Af Amer: >60 mL/min (ref >60)
Glucose, Bld: 82 mg/dL (ref 70–99)
Potassium: 3.7 mmol/L (ref 3.5–5.1)
Sodium: 139 mmol/L (ref 135–145)
Total Bilirubin: 0.8 mg/dL (ref 0.3–1.2)
Total Protein: 6.7 g/dL (ref 6.5–8.1)

## 2018-10-04 LAB — CBC
HCT: 43.2 % (ref 36.0–46.0)
Hemoglobin: 14.2 g/dL (ref 12.0–15.0)
MCH: 30.1 pg (ref 26.0–34.0)
MCHC: 32.9 g/dL (ref 30.0–36.0)
MCV: 91.5 fL (ref 80.0–100.0)
PLATELETS: 409 10*3/uL — AB (ref 150–400)
RBC: 4.72 MIL/uL (ref 3.87–5.11)
RDW: 12.5 % (ref 11.5–15.5)
WBC: 11.4 10*3/uL — ABNORMAL HIGH (ref 4.0–10.5)
nRBC: 0 % (ref 0.0–0.2)

## 2018-10-04 MED ORDER — NICOTINE POLACRILEX 2 MG MT GUM
2.0000 mg | CHEWING_GUM | OROMUCOSAL | Status: DC | PRN
  Administered 2018-10-04 (×2): 2 mg via ORAL

## 2018-10-04 MED ORDER — ARIPIPRAZOLE 10 MG PO TABS
10.0000 mg | ORAL_TABLET | Freq: Every day | ORAL | Status: DC
  Administered 2018-10-04: 10 mg via ORAL
  Filled 2018-10-04 (×2): qty 1

## 2018-10-04 MED ORDER — PROPRANOLOL HCL 10 MG PO TABS
10.0000 mg | ORAL_TABLET | Freq: Three times a day (TID) | ORAL | Status: DC
  Administered 2018-10-04 – 2018-10-05 (×2): 10 mg via ORAL
  Filled 2018-10-04 (×6): qty 1

## 2018-10-04 NOTE — Progress Notes (Signed)
[facility] MD Progress Note  10/04/2018 11:11 AM Misty Jimenez  MRN:  [id number] Subjective:  22 year old female, known to our unit from recent psychiatric admission from 11/29 through 12/3 /19. At the time was admitted for depression, suicidal ideations and reporting  recent DUI charge. Was diagnosed with PTSD, MDD, Alcohol Use Disorder, was discharged on Abilify and Prozac .   Objective: Patient is seen and examined.  Patient is a 22 year old female known to me from previous admissions on 11/29 through 09/28/2018.  She really presented because she was feeling depressed and "suicidal again".  The patient had been discharged and went to her grandparents house.  The grandparents brought her back because she was staying in her room all the time, and still showing evidence of depression.  She was readmitted to the hospital.  He stated she feels better now than when she was originally admitted, but still continues to have thoughts of suicidal ideation.  She was thinking about burning herself with cigarettes, and also felt depressed.  On admission her Abilify and Prozac was continued and she was started on mirtazapine 7.5 mg p.o. nightly.  She stated this morning that it did not really help her sleep, and she wanted it increased.  I told her that the lower doses of mirtazapine were more sedating and we should probably monitor that.  Her vital signs are stable, she is afebrile.  Her labs were essentially normal on admission.  Her marijuana was positive.  Principal Problem: <principal problem not specified> Diagnosis: Active Problems:   MDD (major depressive disorder), severe (HCC)  Total Time spent with patient: 20 minutes  Past Psychiatric History: See admission H&P  Past Medical History:  Past Medical History:  Diagnosis Date  . Abdominal pain, chronic, right lower quadrant 08/03/2013  . Abdominal pain, recurrent    With Headache  . Acute pyelonephritis 05/07/2016   kidney surgery at 62 months of age  .  ADHD (attention deficit hyperactivity disorder)   . Allergy   . Anxiety   . Depression   . Endometriosis   . Family history of adverse reaction to anesthesia    "grandmother gets PONV"  . GE reflux 12/16/2011  . Hyperlipidemia   . Kidney disease 05/07/2016  . PCOS (polycystic ovarian syndrome)   . Preventative health care 11/04/2016  . Reflux, vesicoureteral   . Wrist pain, left 11/04/2016    Past Surgical History:  Procedure Laterality Date  . KIDNEY SURGERY     As a small child  . TYMPANOSTOMY TUBE PLACEMENT    . URETER SURGERY    . WRIST SURGERY Left 05/2016   10 pins and 2 plates   Family History:  Family History  Problem Relation Age of Onset  . Kidney disease Mother   . Migraines Maternal Aunt   . Diabetes Maternal Grandfather   . Hyperlipidemia Other   . Hypertension Other   . Depression Other   . Alcohol abuse Father        and drug use, heroin, cocaine, marijuana  . Cancer Paternal Aunt        colon in 37s deceased   Family Psychiatric  History: See admission H&P Social History:  Social History   Substance and Sexual Activity  Alcohol Use Yes  . Alcohol/week: 2.0 - 5.0 standard drinks  . Types: 2 - 5 Cans of beer per week   Comment: 3-4 x week     Social History   Substance and Sexual Activity  Drug  Use Yes  . Types: Marijuana    Social History   Socioeconomic History  . Marital status: Single    Spouse name: Not on file  . Number of children: Not on file  . Years of education: Not on file  . Highest education level: Not on file  Occupational History  . Not on file  Social Needs  . Financial resource strain: Not on file  . Food insecurity:    Worry: Not on file    Inability: Not on file  . Transportation needs:    Medical: Not on file    Non-medical: Not on file  Tobacco Use  . Smoking status: Current Every Day Smoker    Years: 0.50    Types: Cigarettes    Last attempt to quit: 10/28/2015    Years since quitting: 2.9  . Smokeless tobacco:  Former Neurosurgeon    Types: Chew  . Tobacco comment: Pt declines information  Substance and Sexual Activity  . Alcohol use: Yes    Alcohol/week: 2.0 - 5.0 standard drinks    Types: 2 - 5 Cans of beer per week    Comment: 3-4 x week  . Drug use: Yes    Types: Marijuana  . Sexual activity: Not Currently    Birth control/protection: Implant  Lifestyle  . Physical activity:    Days per week: Not on file    Minutes per session: Not on file  . Stress: Not on file  Relationships  . Social connections:    Talks on phone: Not on file    Gets together: Not on file    Attends religious service: Not on file    Active member of club or organization: Not on file    Attends meetings of clubs or organizations: Not on file    Relationship status: Not on file  Other Topics Concern  . Not on file  Social History Narrative   Lives in boyfriend, completing High school   No dietary restrictions has a history of binge eating.. Former smoker, no alcohol or drug use.   Additional Social History:                         Sleep: Fair  Appetite:  Good  Current Medications: Current Facility-Administered Medications  Medication Dose Route Frequency Provider Last Rate Last Dose  . alum & mag hydroxide-simeth (MAALOX/MYLANTA) 200-200-20 MG/5ML suspension 30 mL  30 mL Oral Q4H PRN Money, Gerlene Burdock, FNP      . ARIPiprazole (ABILIFY) tablet 5 mg  5 mg Oral Daily Cobos, Rockey Situ, MD   5 mg at 10/04/18 0733  . FLUoxetine (PROZAC) capsule 20 mg  20 mg Oral Daily Money, Gerlene Burdock, FNP   20 mg at 10/04/18 0734  . hydrOXYzine (ATARAX/VISTARIL) tablet 25 mg  25 mg Oral TID PRN Money, Gerlene Burdock, FNP   25 mg at 10/04/18 0740  . ibuprofen (ADVIL,MOTRIN) tablet 400 mg  400 mg Oral Q6H PRN Cobos, Rockey Situ, MD   400 mg at 10/04/18 0740  . magnesium hydroxide (MILK OF MAGNESIA) suspension 30 mL  30 mL Oral Daily PRN Money, Feliz Beam B, FNP      . mirtazapine (REMERON) tablet 7.5 mg  7.5 mg Oral QHS Cobos, Rockey Situ,  MD   7.5 mg at 10/03/18 2124  . nicotine polacrilex (NICORETTE) gum 2 mg  2 mg Oral PRN Antonieta Pert, MD   2 mg at 10/04/18 1024  Lab Results:  Results for orders placed or performed during the hospital encounter of 10/03/18 (from the past 48 hour(s))  Pregnancy, urine     Status: None   Collection Time: 10/03/18  4:48 PM  Result Value Ref Range   Preg Test, Ur NEGATIVE NEGATIVE    Comment:        THE SENSITIVITY OF THIS METHODOLOGY IS >20 mIU/mL. Performed at [facility], [address] [place], [place] [address]   Urine rapid drug screen (hosp performed)not at [facility]     Status: Abnormal   Collection Time: 10/03/18  4:48 PM  Result Value Ref Range   Opiates NONE DETECTED NONE DETECTED   Cocaine NONE DETECTED NONE DETECTED   Benzodiazepines NONE DETECTED NONE DETECTED   Amphetamines NONE DETECTED NONE DETECTED   Tetrahydrocannabinol POSITIVE (A) NONE DETECTED   Barbiturates NONE DETECTED NONE DETECTED    Comment: (NOTE) DRUG SCREEN FOR MEDICAL PURPOSES ONLY.  IF CONFIRMATION IS NEEDED FOR ANY PURPOSE, NOTIFY LAB WITHIN 5 DAYS. LOWEST DETECTABLE LIMITS FOR URINE DRUG SCREEN Drug Class                     Cutoff (ng/mL) Amphetamine and metabolites    1000 Barbiturate and metabolites    200 Benzodiazepine                 200 Tricyclics and metabolites     300 Opiates and metabolites        300 Cocaine and metabolites        300 THC                            50 Performed at [facility], [address] [place], [place] [address]   Urinalysis, Complete w Microscopic     Status: Abnormal   Collection Time: 10/03/18  4:48 PM  Result Value Ref Range   Color, Urine YELLOW YELLOW   APPearance TURBID (A) CLEAR   Specific Gravity, Urine >1.030 (H) 1.005 - 1.030   pH 6.0 5.0 - 8.0   Glucose, UA NEGATIVE NEGATIVE mg/dL   Hgb urine dipstick NEGATIVE NEGATIVE   Bilirubin Urine NEGATIVE NEGATIVE   Ketones, ur NEGATIVE NEGATIVE mg/dL    Protein, ur NEGATIVE NEGATIVE mg/dL   Nitrite NEGATIVE NEGATIVE   Leukocytes, UA NEGATIVE NEGATIVE   Squamous Epithelial / LPF 6-10 0 - 5   WBC, UA NONE SEEN 0 - 5 WBC/hpf   RBC / HPF NONE SEEN 0 - 5 RBC/hpf   Bacteria, UA NONE SEEN NONE SEEN    Comment: Performed at [facility], [address] [place], [place] [address]  CBC     Status: Abnormal   Collection Time: 10/04/18  6:44 AM  Result Value Ref Range   WBC 11.4 (H) 4.0 - 10.5 K/uL   RBC 4.72 3.87 - 5.11 MIL/uL   Hemoglobin 14.2 12.0 - 15.0 g/dL   HCT 91.443.2 78.236.0 - 95.646.0 %   MCV 91.5 80.0 - 100.0 fL   MCH 30.1 26.0 - 34.0 pg   MCHC 32.9 30.0 - 36.0 g/dL   RDW 21.312.5 08.611.5 - 57.815.5 %   Platelets 409 (H) 150 - 400 K/uL   nRBC 0.0 0.0 - 0.2 %    Comment: Performed at [facility], [address] [place], [place] [address]  Comprehensive metabolic panel     Status: None   Collection  Time: 10/04/18  6:44 AM  Result Value Ref Range   Sodium 139 135 - 145 mmol/L   Potassium 3.7 3.5 - 5.1 mmol/L   Chloride 106 98 - 111 mmol/L   CO2 24 22 - 32 mmol/L   Glucose, Bld 82 70 - 99 mg/dL   BUN 11 6 - 20 mg/dL   Creatinine, Ser 2.95 0.44 - 1.00 mg/dL   Calcium 9.2 8.9 - 62.1 mg/dL   Total Protein 6.7 6.5 - 8.1 g/dL   Albumin 4.1 3.5 - 5.0 g/dL   AST 17 15 - 41 U/L   ALT 8 0 - 44 U/L   Alkaline Phosphatase 80 38 - 126 U/L   Total Bilirubin 0.8 0.3 - 1.2 mg/dL   GFR calc non Af Amer >60 >60 mL/min   GFR calc Af Amer >60 >60 mL/min   Anion gap 9 5 - 15    Comment: Performed at [name], [address] [place], [place] [address]    Blood Alcohol level:  Lab Results  Component Value Date   ETH <10 09/24/2018    Metabolic Disorder Labs: Lab Results  Component Value Date   HGBA1C 4.6 (L) 09/25/2018   MPG 85.32 09/25/2018   MPG 103 03/24/2014   Lab Results  Component Value Date   PROLACTIN 6.7 08/03/2013   Lab Results  Component Value Date   CHOL 258 (H)  09/26/2018   TRIG 69 09/26/2018   HDL 46 09/26/2018   CHOLHDL 5.6 09/26/2018   VLDL 14 09/26/2018   LDLCALC 198 (H) 09/26/2018   LDLCALC 132 (H) 11/04/2016    Physical Findings: AIMS: Facial and Oral Movements Muscles of Facial Expression: None, normal Lips and Perioral Area: None, normal Jaw: None, normal Tongue: None, normal,Extremity Movements Upper (arms, wrists, hands, fingers): None, normal Lower (legs, knees, ankles, toes): None, normal, Trunk Movements Neck, shoulders, hips: None, normal, Overall Severity Severity of abnormal movements (highest score from questions above): None, normal Incapacitation due to abnormal movements: None, normal Patient's awareness of abnormal movements (rate only patient's report): No Awareness, Dental Status Current problems with teeth and/or dentures?: No Does patient usually wear dentures?: No  CIWA:    COWS:     Musculoskeletal: Strength & Muscle Tone: within normal limits Gait & Station: normal Patient leans: N/A  Psychiatric Specialty Exam: Physical Exam  Nursing note and vitals reviewed. Constitutional: She is oriented to person, place, and time. She appears well-developed and well-nourished.  HENT:  Head: Normocephalic and atraumatic.  Respiratory: Effort normal.  Neurological: She is alert and oriented to person, place, and time.    ROS  Blood pressure (!) 132/91, pulse 87, temperature 98.1 F (36.7 C), temperature source Oral, resp. rate 16, height 5\' 7"  (1.702 m), weight 111.1 kg, SpO2 100 %.Body mass index is 38.37 kg/m.  General Appearance: Casual  Eye Contact:  Fair  Speech:  Normal Rate  Volume:  Normal  Mood:  Anxious and Depressed  Affect:  Congruent  Thought Process:  Coherent and Descriptions of Associations: Intact  Orientation:  Full (Time, Place, and Person)  Thought Content:  Logical  Suicidal Thoughts:  Yes.  without intent/plan  Homicidal Thoughts:  No  Memory:  Immediate;   Fair Recent;    Fair Remote;   Fair  Judgement:  Impaired  Insight:  Lacking  Psychomotor Activity:  Normal  Concentration:  Concentration: Fair and Attention Span: Fair  Recall:  [employer] of Knowledge:  Fair  Language:  Good  Akathisia:  Negative  Handed:  Right  AIMS (if indicated):     Assets:  Communication Skills Desire for Improvement Housing Physical Health Social Support  ADL's:  Intact  Cognition:  WNL  Sleep:        Treatment Plan Summary: Daily contact with patient to assess and evaluate symptoms and progress in treatment, Medication management and Plan 22 year old female, known to our unit from recent psychiatric admission from 11/29 through 12/3 /19. At the time was admitted for depression, suicidal ideations and reporting  recent DUI charge. Was diagnosed with PTSD, MDD, Alcohol Use Disorder, was discharged on Abilify and Prozac .  Patient was started on mirtazapine 7.5 mg p.o. nightly on admission.  This will be continued.  I am going to increase her Abilify to 10 mg, and change it to nightly to assist with sleep.  We also discussed going to the [facility] facility in [place].  She stated she had been in contact with them after discharge, and we will assist her in attempting to transfer her to that facility. 1.  Continue fluoxetine 20 mg p.o. daily for mood and anxiety 2.  Increase Abilify to 10 mg, and changed to nightly for mood stability and depression as well as sleep. 3.  Continue mirtazapine 7.5 mg p.o. nightly for mood and sleep. 4.  Continue hydroxyzine 25 mg p.o. 3 times daily as needed anxiety. 5.  Assist with potential referral to the [facility] facility in [place]. 6.  Disposition planning-in progress.Antonieta Pert, MD 10/04/2018, 11:11 AM
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
## 2018-10-04 NOTE — [facility] Suicide Risk Assessment (Signed)
[facility] INPATIENT:  Family/Significant Other Suicide Prevention Education  Suicide Prevention Education:  Contact Attempts: grandmother, Misty Jimenez [phone]) has been identified by the patient as the family member/significant other with whom the patient will be residing, and identified as the person(s) who will aid the patient in the event of a mental health crisis.  With written consent from the patient, two attempts were made to provide suicide prevention education, prior to and/or following the patient's discharge.  We were unsuccessful in providing suicide prevention education.  A suicide education pamphlet was given to the patient to share with family/significant other.  Date and time of first attempt: 10/04/18 at 3:10pm. Left a voicemail requesting a returned call. Date and time of second attempt: Will follow up at a later time  Misty Jimenez 10/04/2018, 3:12 PM

## 2018-10-04 NOTE — Progress Notes (Signed)
Recreation Therapy Notes  Date: 12.9.19 Time: 0930 Location: 300 [address] Dayroom  Group Topic: Stress Management  Goal Area(s) Addresses:  Patient will verbalize importance of using healthy stress management.  Patient will identify positive emotions associated with healthy stress management.   Intervention: Stress Management  Activity :  Guided Imagery.  LRT introduced the stress management technique of guided imagery.  LRT read a script for patients to visualize being at the beach.  Patients were to follow along as script was read.  Education:  Stress Management, Discharge Planning.   Education Outcome: Acknowledges edcuation/In group clarification offered/Needs additional education  Clinical Observations/Feedback: Pt did not attend activity.     Melitza Metheny, LRT/CTRS         Burman Bruington A 10/04/2018 12:34 PM 
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
## 2018-10-04 NOTE — Progress Notes (Signed)
Patient ID: Misty Jimenez, female   DOB: [birth year], 22 y.o.   MRN: [id number] DAR Note: Pt observed in the dayroom interacting with peers. Pt at assessment endorsed moderate anxiety and depression "today is my boyfriend birthday and I can't be there." All patient's questions and concerns addressed. Support, encouragement, and safe environment provided. 15 mins safety check continue for Pt safety. Pt attended AA meeting.

## 2018-10-04 NOTE — Progress Notes (Signed)
D: Patient has exhibited attention seeking behavior.  She has requested several prns from staff.  Patient also is complaining of her ankle, which she claims she twisted in the gym last week.  Patient was discharged last week, and she was advised to go to the ED for further assessment.  She states, "it's black and blue."  Her ankle was assessed and it did not look bruised.  She is now getting her x-ray completed at this time.  She rates her depression, anxiety, and hopelessness as a 9.  She denies any thoughts of self harm today.   A: Continue to monitor medication management and MD orders.  Safety checks completed every 15 minutes per protocol.  Offer support and encouragement as needed.  R: Patient is receptive to staff; she continues to exhibit attention seeking behavior.

## 2018-10-04 NOTE — Tx Team (Signed)
Interdisciplinary Treatment and Diagnostic Plan Update  10/04/2018 Time of Session:  Misty Jimenez MRN: [id number]  Principal Diagnosis: <principal problem not specified>  Secondary Diagnoses: Active Problems:   MDD (major depressive disorder), severe (HCC)   Current Medications:  Current Facility-Administered Medications  Medication Dose Route Frequency Provider Last Rate Last Dose  . alum & mag hydroxide-simeth (MAALOX/MYLANTA) 200-200-20 MG/5ML suspension 30 mL  30 mL Oral Q4H PRN Money, Gerlene Burdock, FNP      . ARIPiprazole (ABILIFY) tablet 10 mg  10 mg Oral QHS Antonieta Pert, MD      . FLUoxetine (PROZAC) capsule 20 mg  20 mg Oral Daily Money, Gerlene Burdock, FNP   20 mg at 10/04/18 0734  . hydrOXYzine (ATARAX/VISTARIL) tablet 25 mg  25 mg Oral TID PRN Money, Gerlene Burdock, FNP   25 mg at 10/04/18 1305  . ibuprofen (ADVIL,MOTRIN) tablet 400 mg  400 mg Oral Q6H PRN Cobos, Rockey Situ, MD   400 mg at 10/04/18 0740  . magnesium hydroxide (MILK OF MAGNESIA) suspension 30 mL  30 mL Oral Daily PRN Money, Feliz Beam B, FNP      . mirtazapine (REMERON) tablet 7.5 mg  7.5 mg Oral QHS Cobos, Rockey Situ, MD   7.5 mg at 10/03/18 2124  . nicotine polacrilex (NICORETTE) gum 2 mg  2 mg Oral PRN Antonieta Pert, MD   2 mg at 10/04/18 1024  . propranolol (INDERAL) tablet 10 mg  10 mg Oral TID Antonieta Pert, MD       PTA Medications: Medications Prior to Admission  Medication Sig Dispense Refill Last Dose  . ARIPiprazole (ABILIFY) 5 MG tablet Take 1 tablet (5 mg total) by mouth daily. For mood control 30 tablet 0   . FLUoxetine (PROZAC) 20 MG capsule Take 1 capsule (20 mg total) by mouth daily. For depression 30 capsule 0   . hydrOXYzine (ATARAX/VISTARIL) 25 MG tablet Take 1 tablet (25 mg total) by mouth every 6 (six) hours as needed for anxiety. 60 tablet 0   . zolpidem (AMBIEN) 5 MG tablet Take 1 tablet (5 mg total) by mouth at bedtime as needed for sleep. 7 tablet 0     Patient Stressors: Loss of  relationship  Patient Strengths: Average or above average intelligence Communication skills Financial means General fund of knowledge Physical Health Supportive family/friends  Treatment Modalities: Medication Management, Group therapy, Case management,  1 to 1 session with clinician, Psychoeducation, Recreational therapy.   Physician Treatment Plan for Primary Diagnosis: <principal problem not specified> Long Term Goal(s): Improvement in symptoms so as ready for discharge Improvement in symptoms so as ready for discharge   Short Term Goals: Ability to identify changes in lifestyle to reduce recurrence of condition will improve Ability to maintain clinical measurements within normal limits will improve Ability to identify changes in lifestyle to reduce recurrence of condition will improve Ability to maintain clinical measurements within normal limits will improve  Medication Management: Evaluate patient's response, side effects, and tolerance of medication regimen.  Therapeutic Interventions: 1 to 1 sessions, Unit Group sessions and Medication administration.  Evaluation of Outcomes: Progressing  Physician Treatment Plan for Secondary Diagnosis: Active Problems:   MDD (major depressive disorder), severe (HCC)  Long Term Goal(s): Improvement in symptoms so as ready for discharge Improvement in symptoms so as ready for discharge   Short Term Goals: Ability to identify changes in lifestyle to reduce recurrence of condition will improve Ability to maintain clinical measurements within normal limits will  improve Ability to identify changes in lifestyle to reduce recurrence of condition will improve Ability to maintain clinical measurements within normal limits will improve     Medication Management: Evaluate patient's response, side effects, and tolerance of medication regimen.  Therapeutic Interventions: 1 to 1 sessions, Unit Group sessions and Medication  administration.  Evaluation of Outcomes: Progressing   RN Treatment Plan for Primary Diagnosis: <principal problem not specified> Long Term Goal(s): Knowledge of disease and therapeutic regimen to maintain health will improve  Short Term Goals: Ability to participate in decision making will improve, Ability to verbalize feelings will improve, Ability to disclose and discuss suicidal ideas and Ability to identify and develop effective coping behaviors will improve  Medication Management: RN will administer medications as ordered by provider, will assess and evaluate patient's response and provide education to patient for prescribed medication. RN will report any adverse and/or side effects to prescribing provider.  Therapeutic Interventions: 1 on 1 counseling sessions, Psychoeducation, Medication administration, Evaluate responses to treatment, Monitor vital signs and CBGs as ordered, Perform/monitor CIWA, COWS, AIMS and Fall Risk screenings as ordered, Perform wound care treatments as ordered.  Evaluation of Outcomes: Progressing   LCSW Treatment Plan for Primary Diagnosis: <principal problem not specified> Long Term Goal(s): Safe transition to appropriate next level of care at discharge, Engage patient in therapeutic group addressing interpersonal concerns.  Short Term Goals: Engage patient in aftercare planning with referrals and resources  Therapeutic Interventions: Assess for all discharge needs, 1 to 1 time with Social worker, Explore available resources and support systems, Assess for adequacy in community support network, Educate family and significant other(s) on suicide prevention, Complete Psychosocial Assessment, Interpersonal group therapy.  Evaluation of Outcomes: Adequate for Discharge   Progress in Treatment: Attending groups: Yes. Participating in groups: Yes. Taking medication as prescribed: Yes. Toleration medication: Yes. Family/Significant other contact made: No,  will contact:  the patient's grandmother Patient understands diagnosis: Yes. Discussing patient identified problems/goals with staff: Yes. Medical problems stabilized or resolved: Yes. Denies suicidal/homicidal ideation: Yes. Issues/concerns per patient self-inventory: No. Other:   New problem(s) identified: None   New Short Term/Long Term Goal(s):  medication stabilization, elimination of SI thoughts, development of comprehensive mental wellness plan.    Patient Goals:  "I want to learn 5-6 coping skills and learn how to manage my anger more appropriately"  Discharge Plan or Barriers: Patient plans to discharge home with her grandparents and follow up with her therapist at [facility], and a [facility] for medication management. The patient was referred to [facility] [place], [place] for a [place]-term treatment at the residential treatment program for mental health.   Reason for Continuation of Hospitalization: Anxiety Depression Medication stabilization Suicidal ideation  Estimated Length of Stay: 3-5 days   Attendees: Patient: Misty Jimenez  10/04/2018 3:11 PM  Physician: Dr. Landry MellowGreg Clary, MD 10/04/2018 3:11 PM  Nursing: Rayfield Citizenaroline.Leonard SchwartzB, RN 10/04/2018 3:11 PM  RN Care Manager: Onnie BoerJennifer Clark, RN 10/04/2018 3:11 PM  Social Worker: Baldo DaubJolan Witters, LCSWA 10/04/2018 3:11 PM  Recreational Therapist:  10/04/2018 3:11 PM  Other: Serena ColonelAggie Nwoko, NP 10/04/2018 3:11 PM  Other:  10/04/2018 3:11 PM  Other: 10/04/2018 3:11 PM    Scribe for Treatment Team: Maeola SarahJolan E Irigoyen, LCSWA 10/04/2018 3:11 PM

## 2018-10-04 NOTE — Progress Notes (Signed)
Adult Psychoeducational Group Note  Date:  10/04/2018 Time:  9:09 PM  Group Topic/Focus:  Wrap-Up Group:   The focus of this group is to help patients review their daily goal of treatment and discuss progress on daily workbooks.  Participation Level:  Active  Participation Quality:  Appropriate  Affect:  Appropriate  Cognitive:  Appropriate  Insight: Appropriate  Engagement in Group:  Engaged  Modes of Intervention:  Discussion  Additional Comments:  Pt stated goal was to work on coping skills for PTSD.  Pt stated goal was met.  Pt rated the day at a 9/10.  Misty Jimenez 10/04/2018, 9:09 PM

## 2018-10-05 DIAGNOSIS — F4310 Post-traumatic stress disorder, unspecified: Secondary | ICD-10-CM

## 2018-10-05 MED ORDER — FLUOXETINE HCL 20 MG PO CAPS: 20.0000 mg | ORAL_CAPSULE | Freq: Every day | ORAL | 0 refills | Status: DC

## 2018-10-05 MED ORDER — ARIPIPRAZOLE 10 MG PO TABS: 10.0000 mg | ORAL_TABLET | Freq: Every day | ORAL | 0 refills | Status: DC

## 2018-10-05 MED ORDER — MIRTAZAPINE 7.5 MG PO TABS: 7.5000 mg | ORAL_TABLET | Freq: Every day | ORAL | 0 refills | Status: DC

## 2018-10-05 MED ORDER — HYDROXYZINE HCL 25 MG PO TABS: 25.0000 mg | ORAL_TABLET | Freq: Three times a day (TID) | ORAL | 0 refills | Status: DC | PRN

## 2018-10-05 MED ORDER — NICOTINE POLACRILEX 2 MG MT GUM: 2.0000 mg | CHEWING_GUM | OROMUCOSAL | 0 refills | Status: DC | PRN

## 2018-10-05 MED ORDER — PROPRANOLOL HCL 10 MG PO TABS: 10.0000 mg | ORAL_TABLET | Freq: Three times a day (TID) | ORAL | 0 refills | Status: DC

## 2018-10-05 MED ORDER — IBUPROFEN 400 MG PO TABS: 400.0000 mg | ORAL_TABLET | Freq: Four times a day (QID) | ORAL | 0 refills | Status: DC | PRN

## 2018-10-05 NOTE — Discharge Summary (Addendum)
Physician Discharge Summary Note  Patient:  Misty Jimenez is an 22 y.o., female  MRN:  [id number]  DOB:  02/03/1996  Patient phone:  [phone] (home)   Patient address:   [address] [place] [address],   Total Time spent with patient: Greater than 30 minutes  Date of Admission:  10/03/2018  Date of Discharge: 10-05-18  Reason for Admission: Worsening depression, suicidal ideations (which she describes as passive). Episode of burning herself with a cigarette 2-3 days ago (history of self-injurious behaviors in the past) .  Principal Problem: MDD (major depressive disorder), severe (HCC)  Discharge Diagnoses: Principal Problem:   MDD (major depressive disorder), severe (HCC) Active Problems:   PTSD (post-traumatic stress disorder)  Past Psychiatric History: Severe recurrent major depressive disorder.  Past Medical History:  Past Medical History:  Diagnosis Date  . Abdominal pain, chronic, right lower quadrant 08/03/2013  . Abdominal pain, recurrent    With Headache  . Acute pyelonephritis 05/07/2016   kidney surgery at 6 months of age  . ADHD (attention deficit hyperactivity disorder)   . Allergy   . Anxiety   . Depression   . Endometriosis   . Family history of adverse reaction to anesthesia    "grandmother gets PONV"  . GE reflux 12/16/2011  . Hyperlipidemia   . Kidney disease 05/07/2016  . PCOS (polycystic ovarian syndrome)   . Preventative health care 11/04/2016  . Reflux, vesicoureteral   . Wrist pain, left 11/04/2016    Past Surgical History:  Procedure Laterality Date  . KIDNEY SURGERY     As a small child  . TYMPANOSTOMY TUBE PLACEMENT    . URETER SURGERY    . WRIST SURGERY Left 05/2016   10 pins and 2 plates   Family History:  Family History  Problem Relation Age of Onset  . Kidney disease Mother   . Migraines Maternal Aunt   . Diabetes Maternal Grandfather   . Hyperlipidemia Other   . Hypertension Other   . Depression Other   . Alcohol  abuse Father        and drug use, heroin, cocaine, marijuana  . Cancer Paternal Aunt        colon in 20s deceased   Family Psychiatric  History: See H&P.  Social History:  Social History   Substance and Sexual Activity  Alcohol Use Yes  . Alcohol/week: 2.0 - 5.0 standard drinks  . Types: 2 - 5 Cans of beer per week   Comment: 3-4 x week     Social History   Substance and Sexual Activity  Drug Use Yes  . Types: Marijuana    Social History   Socioeconomic History  . Marital status: Single    Spouse name: Not on file  . Number of children: Not on file  . Years of education: Not on file  . Highest education level: Not on file  Occupational History  . Not on file  Social Needs  . Financial resource strain: Not on file  . Food insecurity:    Worry: Not on file    Inability: Not on file  . Transportation needs:    Medical: Not on file    Non-medical: Not on file  Tobacco Use  . Smoking status: Current Every Day Smoker    Years: 0.50    Types: Cigarettes    Last attempt to quit: 10/28/2015    Years since quitting: 2.9  . Smokeless tobacco: Former User    Types: Chew  .   Physician Discharge Summary Note  Patient:  Misty Jimenez is an 22 y.o., female  MRN:  [id number]  DOB:  02/03/1996  Patient phone:  [phone] (home)   Patient address:   [address] [place] [address],   Total Time spent with patient: Greater than 30 minutes  Date of Admission:  10/03/2018  Date of Discharge: 10-05-18  Reason for Admission: Worsening depression, suicidal ideations (which she describes as passive). Episode of burning herself with a cigarette 2-3 days ago (history of self-injurious behaviors in the past) .  Principal Problem: MDD (major depressive disorder), severe (HCC)  Discharge Diagnoses: Principal Problem:   MDD (major depressive disorder), severe (HCC) Active Problems:   PTSD (post-traumatic stress disorder)  Past Psychiatric History: Severe recurrent major depressive disorder.  Past Medical History:  Past Medical History:  Diagnosis Date  . Abdominal pain, chronic, right lower quadrant 08/03/2013  . Abdominal pain, recurrent    With Headache  . Acute pyelonephritis 05/07/2016   kidney surgery at 6 months of age  . ADHD (attention deficit hyperactivity disorder)   . Allergy   . Anxiety   . Depression   . Endometriosis   . Family history of adverse reaction to anesthesia    "grandmother gets PONV"  . GE reflux 12/16/2011  . Hyperlipidemia   . Kidney disease 05/07/2016  . PCOS (polycystic ovarian syndrome)   . Preventative health care 11/04/2016  . Reflux, vesicoureteral   . Wrist pain, left 11/04/2016    Past Surgical History:  Procedure Laterality Date  . KIDNEY SURGERY     As a small child  . TYMPANOSTOMY TUBE PLACEMENT    . URETER SURGERY    . WRIST SURGERY Left 05/2016   10 pins and 2 plates   Family History:  Family History  Problem Relation Age of Onset  . Kidney disease Mother   . Migraines Maternal Aunt   . Diabetes Maternal Grandfather   . Hyperlipidemia Other   . Hypertension Other   . Depression Other   . Alcohol  abuse Father        and drug use, heroin, cocaine, marijuana  . Cancer Paternal Aunt        colon in 20s deceased   Family Psychiatric  History: See H&P.  Social History:  Social History   Substance and Sexual Activity  Alcohol Use Yes  . Alcohol/week: 2.0 - 5.0 standard drinks  . Types: 2 - 5 Cans of beer per week   Comment: 3-4 x week     Social History   Substance and Sexual Activity  Drug Use Yes  . Types: Marijuana    Social History   Socioeconomic History  . Marital status: Single    Spouse name: Not on file  . Number of children: Not on file  . Years of education: Not on file  . Highest education level: Not on file  Occupational History  . Not on file  Social Needs  . Financial resource strain: Not on file  . Food insecurity:    Worry: Not on file    Inability: Not on file  . Transportation needs:    Medical: Not on file    Non-medical: Not on file  Tobacco Use  . Smoking status: Current Every Day Smoker    Years: 0.50    Types: Cigarettes    Last attempt to quit: 10/28/2015    Years since quitting: 2.9  . Smokeless tobacco: Former User    Types: Chew  .   Physician Discharge Summary Note  Patient:  Misty Jimenez is an 22 y.o., female  MRN:  [id number]  DOB:  02/03/1996  Patient phone:  [phone] (home)   Patient address:   [address] [place] [address],   Total Time spent with patient: Greater than 30 minutes  Date of Admission:  10/03/2018  Date of Discharge: 10-05-18  Reason for Admission: Worsening depression, suicidal ideations (which she describes as passive). Episode of burning herself with a cigarette 2-3 days ago (history of self-injurious behaviors in the past) .  Principal Problem: MDD (major depressive disorder), severe (HCC)  Discharge Diagnoses: Principal Problem:   MDD (major depressive disorder), severe (HCC) Active Problems:   PTSD (post-traumatic stress disorder)  Past Psychiatric History: Severe recurrent major depressive disorder.  Past Medical History:  Past Medical History:  Diagnosis Date  . Abdominal pain, chronic, right lower quadrant 08/03/2013  . Abdominal pain, recurrent    With Headache  . Acute pyelonephritis 05/07/2016   kidney surgery at 6 months of age  . ADHD (attention deficit hyperactivity disorder)   . Allergy   . Anxiety   . Depression   . Endometriosis   . Family history of adverse reaction to anesthesia    "grandmother gets PONV"  . GE reflux 12/16/2011  . Hyperlipidemia   . Kidney disease 05/07/2016  . PCOS (polycystic ovarian syndrome)   . Preventative health care 11/04/2016  . Reflux, vesicoureteral   . Wrist pain, left 11/04/2016    Past Surgical History:  Procedure Laterality Date  . KIDNEY SURGERY     As a small child  . TYMPANOSTOMY TUBE PLACEMENT    . URETER SURGERY    . WRIST SURGERY Left 05/2016   10 pins and 2 plates   Family History:  Family History  Problem Relation Age of Onset  . Kidney disease Mother   . Migraines Maternal Aunt   . Diabetes Maternal Grandfather   . Hyperlipidemia Other   . Hypertension Other   . Depression Other   . Alcohol  abuse Father        and drug use, heroin, cocaine, marijuana  . Cancer Paternal Aunt        colon in 20s deceased   Family Psychiatric  History: See H&P.  Social History:  Social History   Substance and Sexual Activity  Alcohol Use Yes  . Alcohol/week: 2.0 - 5.0 standard drinks  . Types: 2 - 5 Cans of beer per week   Comment: 3-4 x week     Social History   Substance and Sexual Activity  Drug Use Yes  . Types: Marijuana    Social History   Socioeconomic History  . Marital status: Single    Spouse name: Not on file  . Number of children: Not on file  . Years of education: Not on file  . Highest education level: Not on file  Occupational History  . Not on file  Social Needs  . Financial resource strain: Not on file  . Food insecurity:    Worry: Not on file    Inability: Not on file  . Transportation needs:    Medical: Not on file    Non-medical: Not on file  Tobacco Use  . Smoking status: Current Every Day Smoker    Years: 0.50    Types: Cigarettes    Last attempt to quit: 10/28/2015    Years since quitting: 2.9  . Smokeless tobacco: Former User    Types: Chew  .   Physician Discharge Summary Note  Patient:  Misty Jimenez is an 22 y.o., female  MRN:  [id number]  DOB:  02/03/1996  Patient phone:  [phone] (home)   Patient address:   [address] [place] [address],   Total Time spent with patient: Greater than 30 minutes  Date of Admission:  10/03/2018  Date of Discharge: 10-05-18  Reason for Admission: Worsening depression, suicidal ideations (which she describes as passive). Episode of burning herself with a cigarette 2-3 days ago (history of self-injurious behaviors in the past) .  Principal Problem: MDD (major depressive disorder), severe (HCC)  Discharge Diagnoses: Principal Problem:   MDD (major depressive disorder), severe (HCC) Active Problems:   PTSD (post-traumatic stress disorder)  Past Psychiatric History: Severe recurrent major depressive disorder.  Past Medical History:  Past Medical History:  Diagnosis Date  . Abdominal pain, chronic, right lower quadrant 08/03/2013  . Abdominal pain, recurrent    With Headache  . Acute pyelonephritis 05/07/2016   kidney surgery at 6 months of age  . ADHD (attention deficit hyperactivity disorder)   . Allergy   . Anxiety   . Depression   . Endometriosis   . Family history of adverse reaction to anesthesia    "grandmother gets PONV"  . GE reflux 12/16/2011  . Hyperlipidemia   . Kidney disease 05/07/2016  . PCOS (polycystic ovarian syndrome)   . Preventative health care 11/04/2016  . Reflux, vesicoureteral   . Wrist pain, left 11/04/2016    Past Surgical History:  Procedure Laterality Date  . KIDNEY SURGERY     As a small child  . TYMPANOSTOMY TUBE PLACEMENT    . URETER SURGERY    . WRIST SURGERY Left 05/2016   10 pins and 2 plates   Family History:  Family History  Problem Relation Age of Onset  . Kidney disease Mother   . Migraines Maternal Aunt   . Diabetes Maternal Grandfather   . Hyperlipidemia Other   . Hypertension Other   . Depression Other   . Alcohol  abuse Father        and drug use, heroin, cocaine, marijuana  . Cancer Paternal Aunt        colon in 20s deceased   Family Psychiatric  History: See H&P.  Social History:  Social History   Substance and Sexual Activity  Alcohol Use Yes  . Alcohol/week: 2.0 - 5.0 standard drinks  . Types: 2 - 5 Cans of beer per week   Comment: 3-4 x week     Social History   Substance and Sexual Activity  Drug Use Yes  . Types: Marijuana    Social History   Socioeconomic History  . Marital status: Single    Spouse name: Not on file  . Number of children: Not on file  . Years of education: Not on file  . Highest education level: Not on file  Occupational History  . Not on file  Social Needs  . Financial resource strain: Not on file  . Food insecurity:    Worry: Not on file    Inability: Not on file  . Transportation needs:    Medical: Not on file    Non-medical: Not on file  Tobacco Use  . Smoking status: Current Every Day Smoker    Years: 0.50    Types: Cigarettes    Last attempt to quit: 10/28/2015    Years since quitting: 2.9  . Smokeless tobacco: Former User    Types: Chew  .   Physician Discharge Summary Note  Patient:  Misty Jimenez is an 22 y.o., female  MRN:  [id number]  DOB:  Dec 03, 1995  Patient phone:  [phone] (home)   Patient address:   [place] [address],   Total Time spent with patient: Greater than 30 minutes  Date of Admission:  10/03/2018  Date of Discharge: 10-05-18  Reason for Admission: Worsening depression, suicidal ideations (which she describes as passive). Episode of burning herself with a cigarette 2-3 days ago (history of self-injurious behaviors in the past) .  Principal Problem: MDD (major depressive disorder), severe ([place])  Discharge Diagnoses: Principal Problem:   MDD (major depressive disorder), severe ([place]) Active Problems:   PTSD (post-traumatic stress disorder)  Past Psychiatric History: Severe recurrent major depressive disorder.  Past Medical History:  Past Medical History:  Diagnosis Date  . Abdominal pain, chronic, right lower quadrant 08/03/2013  . Abdominal pain, recurrent    With Headache  . Acute pyelonephritis 05/07/2016   kidney surgery at 17 months of age  . ADHD (attention deficit hyperactivity disorder)   . Allergy   . Anxiety   . Depression   . Endometriosis   . Family history of adverse reaction to anesthesia    "grandmother gets PONV"  . GE reflux 12/16/2011  . Hyperlipidemia   . Kidney disease 05/07/2016  . PCOS (polycystic ovarian syndrome)   . Preventative health care 11/04/2016  . Reflux, vesicoureteral   . Wrist pain, left 11/04/2016    Past Surgical History:  Procedure Laterality Date  . KIDNEY SURGERY     As a small child  . TYMPANOSTOMY TUBE PLACEMENT    . URETER SURGERY    . WRIST SURGERY Left 05/2016   10 pins and 2 plates   Family History:  Family History  Problem Relation Age of Onset  . Kidney disease Mother   . Migraines Maternal Aunt   . Diabetes Maternal Grandfather   . Hyperlipidemia Other   . Hypertension Other   . Depression Other   . Alcohol  abuse Father        and drug use, heroin, cocaine, marijuana  . Cancer Paternal Aunt        colon in 28s deceased   Family Psychiatric  History: See H&P.  Social History:  Social History   Substance and Sexual Activity  Alcohol Use Yes  . Alcohol/week: 2.0 - 5.0 standard drinks  . Types: 2 - 5 Cans of beer per week   Comment: 3-4 x week     Social History   Substance and Sexual Activity  Drug Use Yes  . Types: Marijuana    Social History   Socioeconomic History  . Marital status: Single    Spouse name: Not on file  . Number of children: Not on file  . Years of education: Not on file  . Highest education level: Not on file  Occupational History  . Not on file  Social Needs  . Financial resource strain: Not on file  . Food insecurity:    Worry: Not on file    Inability: Not on file  . Transportation needs:    Medical: Not on file    Non-medical: Not on file  Tobacco Use  . Smoking status: Current Every Day Smoker    Years: 0.50    Types: Cigarettes    Last attempt to quit: 10/28/2015    Years since quitting: 2.9  . Smokeless tobacco: Former Systems developer    Types: Chew  .

## 2018-10-05 NOTE — Progress Notes (Signed)
  [facility] :  Will you be returning to the same living situation after discharge:  Yes,  patient reports she is returning home with her grandparents At discharge, do you have transportation home?: Yes,  patient reports her granmother was picking her up at discharge Do you have the ability to pay for your medications: Yes,  [employer] and support from grandparents  Release of information consent forms completed and in the chart;  Patient's signature needed at discharge.  Patient to Follow up at: Follow-up Information    [facility]. Go on 10/06/2018.   Why:  Your next appointment with your therapist [name] is Wednesday, 10/06/18. Be sure to call if you cannot make this appointment Contact information: [address] [place], [place] [address] Phone: [phone]       [facility]. Go on 10/08/2018.   Why:  Please attend your intake appointment on Friday, 10/08/18 at 2:00p. Your medication management appointment is Monday, 10/11/18 at 8:30a.  Please call within 24 hours of discharge to secure appointment and pay $20 deposit.  Contact information: [address] [place] [address] [phone]           Next level of care provider [facility]:yes  Safety Planning and Suicide Prevention discussed: Yes,  with the patient; attempted patient's grandmother twice  Have you used any form of tobacco in the last 30 days? (Cigarettes, Smokeless Tobacco, Cigars, and/or Pipes): Yes  Has patient been referred to the Quitline?: Patient refused referral  Patient has been referred for addiction treatment: N/A  Misty SarahJolan E Jimenez, LCSWA 10/05/2018, 2:20 PM

## 2018-10-05 NOTE — [facility] Suicide Risk Assessment (Signed)
[facility] INPATIENT:  Family/Significant Other Suicide Prevention Education  Suicide Prevention Education:  Contact Attempts: [name], grandmother [phone]) has been identified by the patient as the family member/significant other with whom the patient will be residing, and identified as the person(s) who will aid the patient in the event of a mental health crisis.  With written consent from the patient, two attempts were made to provide suicide prevention education, prior to and/or following the patient's discharge.  We were unsuccessful in providing suicide prevention education.  A suicide education pamphlet was given to the patient to share with family/significant other.   Date and time of second attempt:10/05/2018 / 10:32am  Misty SarahJolan E Jimenez 10/05/2018, 10:32 AM

## 2018-10-05 NOTE — Progress Notes (Signed)
Discharge note: Patient reviewed discharge paperwork with RN including prescriptions, follow up appointments, and lab work. Patient given the opportunity to ask questions. All concerns were addressed. All belongings were returned to patient. Denied SI/HI/AVH. Patient thanked staff for their care while at the hospital.  Patient was discharged to lobby where her grandfather was there to pick her up.  Patient said she lost a blanket, but no blanket was found upon discharge.

## 2018-10-05 NOTE — [facility] Suicide Risk Assessment (Signed)
[facility] INPATIENT:  Family/Significant Other Suicide Prevention Education  Suicide Prevention Education:   SPE completed with patient, as patient refused to consent to family contact. Patient was encouraged to share information with support network, ask questions, and talk about any concerns relating to SPE. Patient denies access to guns/firearms and verbalized understanding of information provided. Mobile Crisis information also provided to patient.  Samah Lapiana Dieujuste, MSW, LCSWA Clinical Social Worker [facility]  Phone: [phone]  

## 2018-10-05 NOTE — Plan of Care (Signed)
Patient remains safe on the unit.  Problem: Education: Goal: Knowledge of [place] Education information/materials will improve Outcome: Adequate for Discharge   Problem: Education: Goal: Emotional status will improve Outcome: Adequate for Discharge   Problem: Education: Goal: Mental status will improve Outcome: Adequate for Discharge   Problem: Education: Goal: Verbalization of understanding the information provided will improve Outcome: Adequate for Discharge   
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
## 2018-10-28 DIAGNOSIS — F17200 Nicotine dependence, unspecified, uncomplicated: Secondary | ICD-10-CM | POA: Insufficient documentation

## 2018-10-28 DIAGNOSIS — F1220 Cannabis dependence, uncomplicated: Secondary | ICD-10-CM | POA: Insufficient documentation

## 2018-10-28 HISTORY — DX: Nicotine dependence, unspecified, uncomplicated: F17.200

## 2018-10-28 HISTORY — DX: Cannabis dependence, uncomplicated: F12.20

## 2018-11-11 ENCOUNTER — Ambulatory Visit (INDEPENDENT_AMBULATORY_CARE_PROVIDER_SITE_OTHER): Payer: No Typology Code available for payment source | Admitting: Family Medicine

## 2018-11-11 DIAGNOSIS — N83201 Unspecified ovarian cyst, right side: Secondary | ICD-10-CM

## 2018-11-11 DIAGNOSIS — R109 Unspecified abdominal pain: Secondary | ICD-10-CM

## 2018-11-11 DIAGNOSIS — F332 Major depressive disorder, recurrent severe without psychotic features: Secondary | ICD-10-CM | POA: Diagnosis not present

## 2018-11-11 DIAGNOSIS — M5489 Other dorsalgia: Secondary | ICD-10-CM

## 2018-11-11 DIAGNOSIS — E282 Polycystic ovarian syndrome: Secondary | ICD-10-CM

## 2018-11-11 DIAGNOSIS — Z23 Encounter for immunization: Secondary | ICD-10-CM

## 2018-11-11 MED ORDER — ARIPIPRAZOLE 20 MG PO TABS: 20.0000 mg | ORAL_TABLET | Freq: Every day | ORAL | 1 refills | Status: DC

## 2018-11-11 NOTE — Patient Instructions (Signed)
Ovarian Cyst         An ovarian cyst is a fluid-filled sac that forms on an ovary. The ovaries are small organs that produce eggs in women. Various types of cysts can form on the ovaries. Some may cause symptoms and require treatment. Most ovarian cysts go away on their own, are not cancerous (are benign), and do not cause problems.  Common types of ovarian cysts include:  · Functional (follicle) cysts.  ? Occur during the menstrual cycle, and usually go away with the next menstrual cycle if you do not get pregnant.  ? Usually cause no symptoms.  · Endometriomas.  ? Are cysts that form from the tissue that lines the uterus (endometrium).  ? Are sometimes called “chocolate cysts” because they become filled with blood that turns brown.  ? Can cause pain in the lower abdomen during intercourse and during your period.  · Cystadenoma cysts.  ? Develop from cells on the outside surface of the ovary.  ? Can get very large and cause lower abdomen pain and pain with intercourse.  ? Can cause severe pain if they twist or break open (rupture).  · Dermoid cysts.  ? Are sometimes found in both ovaries.  ? May contain different kinds of body tissue, such as skin, teeth, hair, or cartilage.  ? Usually do not cause symptoms unless they get very big.  · Theca lutein cysts.  ? Occur when too much of a certain hormone (human chorionic gonadotropin) is produced and overstimulates the ovaries to produce an egg.  ? Are most common after having procedures used to assist with the conception of a baby (in vitro fertilization).  What are the causes?  Ovarian cysts may be caused by:  · Ovarian hyperstimulation syndrome. This is a condition that can develop from taking fertility medicines. It causes multiple large ovarian cysts to form.  · Polycystic ovarian syndrome (PCOS). This is a common hormonal disorder that can cause ovarian cysts, as well as problems with your period or fertility.  What increases the risk?  The following factors may  make you more likely to develop ovarian cysts:  · Being overweight or obese.  · Taking fertility medicines.  · Taking certain forms of hormonal birth control.  · Smoking.  What are the signs or symptoms?  Many ovarian cysts do not cause symptoms. If symptoms are present, they may include:  · Pelvic pain or pressure.  · Pain in the lower abdomen.  · Pain during sex.  · Abdominal swelling.  · Abnormal menstrual periods.  · Increasing pain with menstrual periods.  How is this diagnosed?  These cysts are commonly found during a routine pelvic exam. You may have tests to find out more about the cyst, such as:  · Ultrasound.  · X-ray of the pelvis.  · CT scan.  · MRI.  · Blood tests.  How is this treated?  Many ovarian cysts go away on their own without treatment. Your health care provider may want to check your cyst regularly for 2-3 months to see if it changes. If you are in menopause, it is especially important to have your cyst monitored closely because menopausal women have a higher rate of ovarian cancer.  When treatment is needed, it may include:  · Medicines to help relieve pain.  · A procedure to drain the cyst (aspiration).  · Surgery to remove the whole cyst.  · Hormone treatment or birth control pills. These methods are sometimes used   to help dissolve a cyst.  Follow these instructions at home:  · Take over-the-counter and prescription medicines only as told by your health care provider.  · Do not drive or use heavy machinery while taking prescription pain medicine.  · Get regular pelvic exams and Pap tests as often as told by your health care provider.  · Return to your normal activities as told by your health care provider. Ask your health care provider what activities are safe for you.  · Do not use any products that contain nicotine or tobacco, such as cigarettes and e-cigarettes. If you need help quitting, ask your health care provider.  · Keep all follow-up visits as told by your health care provider.  This is important.  Contact a health care provider if:  · Your periods are late, irregular, or painful, or they stop.  · You have pelvic pain that does not go away.  · You have pressure on your bladder or trouble emptying your bladder completely.  · You have pain during sex.  · You have any of the following in your abdomen:  ? A feeling of fullness.  ? Pressure.  ? Discomfort.  ? Pain that does not go away.  ? Swelling.  · You feel generally ill.  · You become constipated.  · You lose your appetite.  · You develop severe acne.  · You start to have more body hair and facial hair.  · You are gaining weight or losing weight without changing your exercise and eating habits.  · You think you may be pregnant.  Get help right away if:  · You have abdominal pain that is severe or gets worse.  · You cannot eat or drink without vomiting.  · You suddenly develop a fever.  · Your menstrual period is much heavier than usual.  This information is not intended to replace advice given to you by your health care provider. Make sure you discuss any questions you have with your health care provider.  Document Released: 10/13/2005 Document Revised: 05/02/2016 Document Reviewed: 03/16/2016  Elsevier Interactive Patient Education © 2019 [employer]

## 2018-11-15 NOTE — Progress Notes (Signed)
Subjective:    Patient ID: Misty Jimenez, female    DOB: Sep 05, 1996, 23 y.o.   MRN: [id number]  No chief complaint on file.   HPI Patient is in today for follow-up.  Overall she feels the Abilify 15 mg daily has helped her but she just still struggle with some agitation.  She notes some anhedonia but no suicidal ideation.  She has an appointment with psychiatry but has not attended it yet.  She was in the ER recently with abdominal pain and was found to have a symptomatic ovarian cyst.  Had some abdominal as well as back pain during this episode as well as increased menstrual flow which has slowed down now. Denies CP/palp/SOB/HA/congestion/fevers/GI or GU c/o. Taking meds as prescribed  Past Medical History:  Diagnosis Date  . Abdominal pain, chronic, right lower quadrant 08/03/2013  . Abdominal pain, recurrent    With Headache  . Acute pyelonephritis 05/07/2016   kidney surgery at 58 months of age  . ADHD (attention deficit hyperactivity disorder)   . Allergy   . Anxiety   . Depression   . Endometriosis   . Family history of adverse reaction to anesthesia    "grandmother gets PONV"  . GE reflux 12/16/2011  . Hyperlipidemia   . Kidney disease 05/07/2016  . PCOS (polycystic ovarian syndrome)   . Preventative health care 11/04/2016  . Reflux, vesicoureteral   . Wrist pain, left 11/04/2016    Past Surgical History:  Procedure Laterality Date  . KIDNEY SURGERY     As a small child  . TYMPANOSTOMY TUBE PLACEMENT    . URETER SURGERY    . WRIST SURGERY Left 05/2016   10 pins and 2 plates    Family History  Problem Relation Age of Onset  . Kidney disease Mother   . Migraines Maternal Aunt   . Diabetes Maternal Grandfather   . Hyperlipidemia Other   . Hypertension Other   . Depression Other   . Alcohol abuse Father        and drug use, heroin, cocaine, marijuana  . Cancer Paternal Aunt        colon in 74s deceased    Social History   Socioeconomic History  . Marital  status: Single    Spouse name: Not on file  . Number of children: Not on file  . Years of education: Not on file  . Highest education level: Not on file  Occupational History  . Not on file  Social Needs  . Financial resource strain: Not on file  . Food insecurity:    Worry: Not on file    Inability: Not on file  . Transportation needs:    Medical: Not on file    Non-medical: Not on file  Tobacco Use  . Smoking status: Current Every Day Smoker    Years: 0.50    Types: Cigarettes    Last attempt to quit: 10/28/2015    Years since quitting: 3.0  . Smokeless tobacco: Former Neurosurgeon    Types: Chew  . Tobacco comment: Pt declines information  Substance and Sexual Activity  . Alcohol use: Yes    Alcohol/week: 2.0 - 5.0 standard drinks    Types: 2 - 5 Cans of beer per week    Comment: 3-4 x week  . Drug use: Yes    Types: Marijuana  . Sexual activity: Not Currently    Birth control/protection: Implant  Lifestyle  . Physical activity:    Days per  week: Not on file    Minutes per session: Not on file  . Stress: Not on file  Relationships  . Social connections:    Talks on phone: Not on file    Gets together: Not on file    Attends religious service: Not on file    Active member of club or organization: Not on file    Attends meetings of clubs or organizations: Not on file    Relationship status: Not on file  . Intimate partner violence:    Fear of current or ex partner: Not on file    Emotionally abused: Not on file    Physically abused: Not on file    Forced sexual activity: Not on file  Other Topics Concern  . Not on file  Social History Narrative   Lives in boyfriend, completing High school   No dietary restrictions has a history of binge eating.. Former smoker, no alcohol or drug use.    Outpatient Medications Prior to Visit  Medication Sig Dispense Refill  . FLUoxetine (PROZAC) 20 MG capsule Take 1 capsule (20 mg total) by mouth daily. For depression 30 capsule 0    . hydrOXYzine (ATARAX/VISTARIL) 25 MG tablet Take 1 tablet (25 mg total) by mouth 3 (three) times daily as needed for anxiety. 60 tablet 0  . ibuprofen (ADVIL,MOTRIN) 400 MG tablet Take 1 tablet (400 mg total) by mouth every 6 (six) hours as needed for mild pain. (May purchase from over the counter): For pain 30 tablet 0  . mirtazapine (REMERON) 7.5 MG tablet Take 1 tablet (7.5 mg total) by mouth at bedtime. For sleep 30 tablet 0  . nicotine polacrilex (NICORETTE) 2 MG gum Take 1 each (2 mg total) by mouth as needed for smoking cessation. (May purchase from over the counter): For smoking cessation. 100 tablet 0  . propranolol (INDERAL) 10 MG tablet Take 1 tablet (10 mg total) by mouth 3 (three) times daily. Anxiety 90 tablet 0  . ARIPiprazole (ABILIFY) 10 MG tablet Take 1 tablet (10 mg total) by mouth at bedtime. For mood control 30 tablet 0   No facility-administered medications prior to visit.     Allergies  Allergen Reactions  . Sulfa Antibiotics Hives and Rash  . Sulfasalazine Hives  . Other     Fiberglass cast caused rash and hives  . Monosodium Glutamate Nausea And Vomiting, Rash and Other (See Comments)    Headache and dizziness  . Sulfa Drugs Cross Reactors Rash    Review of Systems  Constitutional: Negative for fever and malaise/fatigue.  HENT: Negative for congestion.   Eyes: Negative for blurred vision.  Respiratory: Negative for shortness of breath.   Cardiovascular: Negative for chest pain, palpitations and leg swelling.  Gastrointestinal: Negative for abdominal pain, blood in stool and nausea.  Genitourinary: Negative for dysuria and frequency.  Musculoskeletal: Positive for back pain. Negative for falls.  Skin: Negative for rash.  Neurological: Negative for dizziness, loss of consciousness and headaches.  Endo/Heme/Allergies: Negative for environmental allergies.  Psychiatric/Behavioral: Positive for depression. Negative for hallucinations, substance abuse and  suicidal ideas. The patient is nervous/anxious.        Objective:    Physical Exam Constitutional:      General: She is not in acute distress.    Appearance: She is not diaphoretic.  HENT:     Head: Normocephalic and atraumatic.     Right Ear: External ear normal.     Left Ear: External ear normal.  Nose: Nose normal.     Mouth/Throat:     Pharynx: No oropharyngeal exudate.  Eyes:     General: No scleral icterus.       Right eye: No discharge.        Left eye: No discharge.     Conjunctiva/sclera: Conjunctivae normal.     Pupils: Pupils are equal, round, and reactive to light.  Neck:     Musculoskeletal: Normal range of motion and neck supple.     Thyroid: No thyromegaly.  Cardiovascular:     Rate and Rhythm: Normal rate and regular rhythm.     Heart sounds: Normal heart sounds. No murmur.  Pulmonary:     Effort: Pulmonary effort is normal. No respiratory distress.     Breath sounds: Normal breath sounds. No wheezing or rales.  Abdominal:     General: Bowel sounds are normal. There is no distension.     Palpations: Abdomen is soft. There is no mass.     Tenderness: There is no abdominal tenderness.  Musculoskeletal: Normal range of motion.        General: No tenderness.  Lymphadenopathy:     Cervical: No cervical adenopathy.  Skin:    General: Skin is warm and dry.     Findings: No rash.  Neurological:     Mental Status: She is alert and oriented to person, place, and time.     Cranial Nerves: No cranial nerve deficit.     Coordination: Coordination normal.     Deep Tendon Reflexes: Reflexes are normal and symmetric. Reflexes normal.     There were no vitals taken for this visit. Wt Readings from Last 3 Encounters:  09/24/18 240 lb (108.9 kg)  09/10/18 246 lb (111.6 kg)  08/17/18 250 lb 12.8 oz (113.8 kg)     Lab Results  Component Value Date   WBC 11.4 (H) 10/04/2018   HGB 14.2 10/04/2018   HCT 43.2 10/04/2018   PLT 409 (H) 10/04/2018   GLUCOSE 82  10/04/2018   CHOL 258 (H) 09/26/2018   TRIG 69 09/26/2018   HDL 46 09/26/2018   LDLCALC 198 (H) 09/26/2018   ALT 8 10/04/2018   AST 17 10/04/2018   NA 139 10/04/2018   K 3.7 10/04/2018   CL 106 10/04/2018   CREATININE 0.60 10/04/2018   BUN 11 10/04/2018   CO2 24 10/04/2018   TSH 3.117 09/25/2018   HGBA1C 4.6 (L) 09/25/2018    Lab Results  Component Value Date   TSH 3.117 09/25/2018   Lab Results  Component Value Date   WBC 11.4 (H) 10/04/2018   HGB 14.2 10/04/2018   HCT 43.2 10/04/2018   MCV 91.5 10/04/2018   PLT 409 (H) 10/04/2018   Lab Results  Component Value Date   NA 139 10/04/2018   K 3.7 10/04/2018   CO2 24 10/04/2018   GLUCOSE 82 10/04/2018   BUN 11 10/04/2018   CREATININE 0.60 10/04/2018   BILITOT 0.8 10/04/2018   ALKPHOS 80 10/04/2018   AST 17 10/04/2018   ALT 8 10/04/2018   PROT 6.7 10/04/2018   ALBUMIN 4.1 10/04/2018   CALCIUM 9.2 10/04/2018   ANIONGAP 9 10/04/2018   GFR 109.45 08/19/2018   Lab Results  Component Value Date   CHOL 258 (H) 09/26/2018   Lab Results  Component Value Date   HDL 46 09/26/2018   Lab Results  Component Value Date   LDLCALC 198 (H) 09/26/2018   Lab Results  Component Value Date  TRIG 69 09/26/2018   Lab Results  Component Value Date   CHOLHDL 5.6 09/26/2018   Lab Results  Component Value Date   HGBA1C 4.6 (L) 09/25/2018       Assessment & Plan:   Problem List Items Addressed This Visit    PCOS (polycystic ovarian syndrome)    Presented to ER with painful right ovarian cyst recently. Referred to GYN for evaluation      MDD (major depressive disorder), recurrent severe, without psychosis (HCC)    Improved some on Abilify but not completely will increase to 20 mg po qd while she awaits her first psychiatry visit.       Midline back pain    Encouraged moist heat and gentle stretching as tolerated. May try NSAIDs and prescription meds as directed and report if symptoms worsen or seek immediate  care       Other Visit Diagnoses    Cyst of right ovary    -  Primary   Relevant Orders   Ambulatory referral to Obstetrics / Gynecology   Abdominal pain, unspecified abdominal location       Relevant Orders   Ambulatory referral to Obstetrics / Gynecology      I have discontinued Garrett L. Williams's ARIPiprazole. I am also having her start on ARIPiprazole. Additionally, I am having her maintain her FLUoxetine, hydrOXYzine, ibuprofen, mirtazapine, propranolol, and nicotine polacrilex.  Meds ordered this encounter  Medications  . ARIPiprazole (ABILIFY) 20 MG tablet    Sig: Take 1 tablet (20 mg total) by mouth daily.    Dispense:  30 tablet    Refill:  1      Danise Edge, MD

## 2018-11-15 NOTE — Assessment & Plan Note (Signed)
Improved some on Abilify but not completely will increase to 20 mg po qd while she awaits her first psychiatry visit.

## 2018-11-15 NOTE — Assessment & Plan Note (Signed)
Encouraged moist heat and gentle stretching as tolerated. May try NSAIDs and prescription meds as directed and report if symptoms worsen or seek immediate care 

## 2018-11-15 NOTE — Assessment & Plan Note (Signed)
Presented to ER with painful right ovarian cyst recently. Referred to GYN for evaluation

## 2018-11-25 ENCOUNTER — Other Ambulatory Visit: Payer: Self-pay | Admitting: Family Medicine

## 2018-11-25 MED ORDER — MIRTAZAPINE 7.5 MG PO TABS: 7.5000 mg | ORAL_TABLET | Freq: Every day | ORAL | 3 refills | Status: DC

## 2018-11-25 MED ORDER — FLUOXETINE HCL 20 MG PO CAPS: 20.0000 mg | ORAL_CAPSULE | Freq: Every day | ORAL | 3 refills | Status: DC

## 2018-11-25 NOTE — Telephone Encounter (Signed)
Copied from CRM [id number]. Topic: General - Other >> Nov 25, 2018  8:35 AM Elliot Gault wrote: Relation to pt: self  Call back number: [phone] Pharmacy: [id number] - [place], [place] - [address] [phone] (Phone) [phone] (Fax)    Reason for call:  Patient requesting FLUoxetine (PROZAC) 20 MG capsule and mirtazapine (REMERON) 7.5 MG tablet, patient declined contacting pharmacy stating she's completely out and would like this request expedited.  Explained to the patient please allow 48 to 72 hour turn around time and please have pharmacy contact PCP office at least 7 days prior to running out, patient voice understanding.

## 2018-11-25 NOTE — Telephone Encounter (Signed)
Requested medication (s) are due for refill today: Yes  Requested medication (s) are on the active medication list: Yes  Last refill:  Prozac 10/06/18; Remeron 10/05/18  Future visit scheduled: Yes  Notes to clinic:  Unable to refill per protocol, last refilled by another provider, failed items on protocol     Requested Prescriptions  Pending Prescriptions Disp Refills   FLUoxetine (PROZAC) 20 MG capsule 30 capsule 0    Sig: Take 1 capsule (20 mg total) by mouth daily. For depression     Psychiatry:  Antidepressants - SSRI Failed - 11/25/2018  8:42 AM      Failed - Completed PHQ-2 or PHQ-9 in the last 360 days.      Passed - Valid encounter within last 6 months    Recent Outpatient Visits          2 weeks ago [facility]yst [facility] right ovary   [facility] [place] Misty StanleyStacey A, MD   2 months ago Midline back pain, unspecified back location, unspecified chronicity   [facility] [place] [facility] [place] Bryon LionsStacey A, MD   3 months ago Urinary urgency   [facility] [place], Stacey A, MD   2 years ago PCOS (polycystic ovarian syndrome)   [facility] [place] Bryon LionsStacey A, MD      Future Appointments            In 1 month Bradd CanaryBlyth, Stacey A, [facility] [place]          mirtazapine (REMERON) 7.5 MG tablet 30 tablet 0    Sig: Take 1 tablet (7.5 mg total) by mouth at bedtime. For sleep     Psychiatry: Antidepressants - mirtazapine Failed - 11/25/2018  8:42 AM      Failed - Total Cholesterol in normal range and within 360 days    Cholesterol  Date Value Ref Range Status  09/26/2018 258 (H) 0 - 200 mg/dL Final         Failed - WBC in normal range and within 360 days    WBC  Date Value Ref Range Status  10/04/2018 11.4 (H) 4.0 - 10.5 K/uL Final         Failed - Completed PHQ-2 or PHQ-9 in the last 360 days.     Passed - AST in normal range and within 360 days    AST  Date Value Ref Range Status  10/04/2018 17 15 - 41 U/L Final         Passed - ALT in normal range and within 360 days    ALT  Date Value Ref Range Status  10/04/2018 8 0 - 44 U/L Final         Passed - Triglycerides in normal range and within 360 days    Triglycerides  Date Value Ref Range Status  09/26/2018 69 <150 mg/dL Final         Passed - Valid encounter within last 6 months    Recent Outpatient Visits          2 weeks ago Cyst of right ovary   [facility] [place] [facility] [place] Misty StanleyStacey A, MD   2 months ago Midline back pain, unspecified back location, unspecified chronicity   [facility] [place], Bryon LionsStacey A, MD   3 months ago Urinary urgency  Holiday representative at [employer], Bryon Lions, MD   2 years ago PCOS (polycystic ovarian syndrome)   Holiday representative at [employer], Bryon Lions, MD      Future Appointments            In 1 month Abner Greenspan, Bryon Lions, MD [facility], [place]

## 2018-11-27 ENCOUNTER — Ambulatory Visit (HOSPITAL_COMMUNITY)
Admission: RE | Admit: 2018-11-27 | Discharge: 2018-11-27 | Disposition: A | Discharge status: Home / Self Care | Payer: Federal, State, Local not specified - Other | Attending: Psychiatry | Admitting: Psychiatry

## 2018-11-27 DIAGNOSIS — Z800 Family history of malignant neoplasm of digestive organs: Secondary | ICD-10-CM | POA: Insufficient documentation

## 2018-11-27 DIAGNOSIS — Z818 Family history of other mental and behavioral disorders: Secondary | ICD-10-CM | POA: Insufficient documentation

## 2018-11-27 DIAGNOSIS — F332 Major depressive disorder, recurrent severe without psychotic features: Secondary | ICD-10-CM | POA: Insufficient documentation

## 2018-11-27 DIAGNOSIS — F419 Anxiety disorder, unspecified: Secondary | ICD-10-CM | POA: Insufficient documentation

## 2018-11-27 DIAGNOSIS — Z888 Allergy status to other drugs, medicaments and biological substances status: Secondary | ICD-10-CM | POA: Diagnosis not present

## 2018-11-27 DIAGNOSIS — Z91018 Allergy to other foods: Secondary | ICD-10-CM | POA: Diagnosis not present

## 2018-11-27 DIAGNOSIS — Z882 Allergy status to sulfonamides status: Secondary | ICD-10-CM | POA: Diagnosis not present

## 2018-11-27 DIAGNOSIS — Z833 Family history of diabetes mellitus: Secondary | ICD-10-CM | POA: Insufficient documentation
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
## 2018-11-27 NOTE — H&P (Signed)
Behavioral Health Medical Screening Exam  [name] is an 23 y.o. female.  Patient presented as a walk-in and I saw her face-to-face.  Patient was reporting suicidal ideations due to conflict with her grandparents whom she lives with.  She states that her grandmother talks down to her and makes her feel depressed.  She provided consent to give her grandmother a call, Baldwin CrownSharee Hutton at [phone], and grandmother stated that patient has been very manipulative at the house.  She is continued smoking marijuana, demanding Misty Jimenez without doing the jobs or her chores at the house which was part of the agreement.  Grandmother states that she is trying to help out her granddaughter and provide her with a place to live, but there was an agreement that she was supposed to get a job and was started paying for her own things which she has not done yet.  Grandmother states that she only works a part-time job for about 4 to 5 hours a day and that is it.  Grandmother states that she had a full-time job and then turned it down.  Grandmother states that she continually manipulates there relationship and use to get his grand father to give him her the Misty Jimenez she is demanding.  Grandmother also states that the patient has repeatedly made suicidal comments at the house to get sympathy from them.  Went back and discussed these comments with the patient and the patient did not deny them and only stated that her grandmother was a "bitch."  She also reports that that is the only place she has to live at this time and she is agreed to go home and work on her arrangement with her grandmother.  She denies any suicidal homicidal ideations and denies any hallucinations.  She reports that she will be fine and she is planning on taking her grandmother [facility] so they can discuss with her therapist their concerns and their problems that they are having at home.  Patient stated that if she  was unable to work on agreement with her grandmother then she would move to [place] to live with her mother.  Total Time spent with patient: 20 minutes  Psychiatric Specialty Exam: Physical Exam  Nursing note and vitals reviewed. Constitutional: She is oriented to person, place, and time. She appears well-developed and well-nourished.  Cardiovascular: Normal rate.  Respiratory: Effort normal.  Musculoskeletal: Normal range of motion.  Neurological: She is alert and oriented to person, place, and time.  Skin: Skin is warm.    Review of Systems  Constitutional: Negative.   HENT: Negative.   Eyes: Negative.   Respiratory: Negative.   Cardiovascular: Negative.   Gastrointestinal: Negative.   Genitourinary: Negative.   Musculoskeletal: Negative.   Skin: Negative.   Neurological: Negative.   Endo/Heme/Allergies: Negative.   Psychiatric/Behavioral: Positive for depression.    Blood pressure 131/79, pulse 86, temperature 97.8 F (36.6 C), resp. rate 18, height 5\' 7"  (1.702 m), weight 111.1 kg, SpO2 98 %.Body mass index is 38.37 kg/m.  General Appearance: Casual  Eye Contact:  Good  Speech:  Clear and Coherent and Normal Rate  Volume:  Normal  Mood:  Depressed  Affect:  Congruent  Thought Process:  Linear and Descriptions of Associations: Intact  Orientation:  Full (Time, Place, and Person)  Thought Content:  WDL  Suicidal Thoughts:  At first stating Si,but then denied  Homicidal Thoughts:  No  Memory:  Immediate;   Good  Recent;   Good Remote;   Good  Judgement:  Fair  Insight:  Fair  Psychomotor Activity:  Normal  Concentration: Concentration: Good and Attention Span: Good  Recall:  Good  Fund of Knowledge:Good  Language: Good  Akathisia:  No  Handed:  Right  AIMS (if indicated):     Assets:  Communication Skills Desire for Improvement Financial Resources/Insurance Housing Physical Health Social Support Transportation  Sleep:  Number of Hours: 6.75     Musculoskeletal: Strength & Muscle Tone: within normal limits Gait & Station: normal Patient leans: N/A  Blood pressure 131/79, pulse 86, temperature 97.8 F (36.6 C), resp. rate 18, height 5\' 7"  (1.702 m), weight 111.1 kg, SpO2 98 %.  Recommendations:  Based on my evaluation the patient does not appear to have an emergency medical condition.  Gerlene Burdock Sheccid Lahmann, FNP 11/27/2018, 1:54 PM

## 2018-12-06 ENCOUNTER — Ambulatory Visit (INDEPENDENT_AMBULATORY_CARE_PROVIDER_SITE_OTHER): Payer: No Typology Code available for payment source | Admitting: Family Medicine

## 2018-12-06 ENCOUNTER — Encounter: Payer: Self-pay | Admitting: Family Medicine

## 2018-12-06 VITALS — BP 126/79 | HR 88 | Ht 67.0 in | Wt 249.1 lb

## 2018-12-06 DIAGNOSIS — Z3046 Encounter for surveillance of implantable subdermal contraceptive: Secondary | ICD-10-CM | POA: Diagnosis not present

## 2018-12-06 DIAGNOSIS — E282 Polycystic ovarian syndrome: Secondary | ICD-10-CM

## 2018-12-06 DIAGNOSIS — N83201 Unspecified ovarian cyst, right side: Secondary | ICD-10-CM

## 2018-12-06 DIAGNOSIS — Z113 Encounter for screening for infections with a predominantly sexual mode of transmission: Secondary | ICD-10-CM | POA: Diagnosis not present

## 2018-12-06 DIAGNOSIS — Z3202 Encounter for pregnancy test, result negative: Secondary | ICD-10-CM | POA: Diagnosis not present

## 2018-12-06 DIAGNOSIS — Z202 Contact with and (suspected) exposure to infections with a predominantly sexual mode of transmission: Secondary | ICD-10-CM | POA: Diagnosis not present

## 2018-12-06 LAB — POCT URINE PREGNANCY: Preg Test, Ur: NEGATIVE

## 2018-12-06 MED ORDER — DICLOFENAC SODIUM 75 MG PO TBEC: 75.0000 mg | DELAYED_RELEASE_TABLET | Freq: Two times a day (BID) | ORAL | 2 refills | Status: DC

## 2018-12-06 MED ORDER — NORGESTIMATE-ETH ESTRADIOL 0.25-35 MG-MCG PO TABS: 1.0000 | ORAL_TABLET | Freq: Every day | ORAL | 11 refills | Status: DC

## 2018-12-06 NOTE — Progress Notes (Signed)
Nexplanon Removal:  Patient given informed consent for removal of her Implanon, time out was performed.  Signed copy in the chart.  Appropriate time out taken. Implanon site identified.  Area prepped in usual sterile fashon. One cc of 1% lidocaine was used to anesthetize the area at the distal end of the implant. A small stab incision was made right beside the implant on the distal portion.  The implanon rod was grasped using hemostats and removed without difficulty.  There was less than 3 cc blood loss. There were no complications.  A small amount of antibiotic ointment and steri-strips were applied over the small incision.  A pressure bandage was applied to reduce any bruising.  The patient tolerated the procedure well and was given post procedure instructions. 

## 2018-12-06 NOTE — Progress Notes (Signed)
Subjective:    Patient ID: Misty Jimenez, female    DOB: 1996-02-04, 22 y.o.   MRN: [id number]  HPI 23 year old G0 who is here for evaluation of pelvic pain.  She was seen in the beginning of January in the emergency room near [facility] for her pelvic pain.  A transvaginal ultrasound was done, showing a 3 to 4 cm right ovarian cyst.  She states that she has had continued "severe" pain that is nonradiating.  Not improved with Tylenol or ibuprofen.  No palliating or provoking factors.   She is also seen for follow-up of Nexplanon.  She would like to have the Nexplanon removed and switch to oral birth control pills.  Is on other medications, so taking an additional pill every day will not be a problem.  She does have concerns for STD exposure and would like to be screened. No abnormal discharge.  I have reviewed the patients past medical, family, and social history.  I have reviewed the patient's medication list and allergies.   Review of Systems     Objective:   Physical Exam Exam conducted with a chaperone present.  Constitutional:      Appearance: Normal appearance.  Cardiovascular:     Rate and Rhythm: Normal rate.     Pulses: Normal pulses.  Pulmonary:     Effort: Pulmonary effort is normal.  Abdominal:     General: Abdomen is flat. Bowel sounds are normal. There is no distension.     Palpations: Abdomen is soft. There is no mass.     Tenderness: There is abdominal tenderness (mild tenderness to palpation in RLQ > LLQ). There is no guarding or rebound.     Hernia: No hernia is present. There is no hernia in the right inguinal area or left inguinal area.  Genitourinary:    Labia:        Right: No rash, tenderness, lesion or injury.        Left: No rash, tenderness, lesion or injury.      Vagina: No signs of injury. No vaginal discharge, tenderness or lesions.     Cervix: No discharge, friability, lesion, erythema or cervical bleeding.  Lymphadenopathy:     Lower Body: No  right inguinal adenopathy. No left inguinal adenopathy.  Skin:    Capillary Refill: Capillary refill takes less than 2 seconds.  Neurological:     Mental Status: She is alert.       Assessment & Plan:  1. Right ovarian cyst Start diclofenac. Will repeat US. - US PELVIS TRANSVANGINAL NON-OB (TV ONLY); Future  2. Nexplanon removal See separate procedure note. Start OCPs, which will help with both her PCOS and ovarian cysts.  3. STD exposure Vaginal cultures obtained.  4. PCOS OCPs will help

## 2018-12-07 LAB — CERVICOVAGINAL ANCILLARY ONLY
Chlamydia: NEGATIVE
Neisseria Gonorrhea: NEGATIVE
Trichomonas: NEGATIVE

## 2018-12-08 ENCOUNTER — Telehealth: Payer: Self-pay

## 2018-12-08 NOTE — Telephone Encounter (Signed)
Pt called the office requesting STD results. Informed pt that results are negative.Understanding was voiced.  Misty Jimenez, CMA

## 2018-12-09 ENCOUNTER — Other Ambulatory Visit (HOSPITAL_BASED_OUTPATIENT_CLINIC_OR_DEPARTMENT_OTHER): Payer: Self-pay

## 2018-12-13 ENCOUNTER — Ambulatory Visit (HOSPITAL_BASED_OUTPATIENT_CLINIC_OR_DEPARTMENT_OTHER)
Admission: RE | Admit: 2018-12-13 | Discharge: 2018-12-13 | Disposition: A | Discharge status: Home / Self Care | Payer: No Typology Code available for payment source | Source: Ambulatory Visit | Attending: Family Medicine | Admitting: Family Medicine

## 2018-12-13 ENCOUNTER — Encounter (HOSPITAL_COMMUNITY): Payer: Self-pay

## 2018-12-13 DIAGNOSIS — N83201 Unspecified ovarian cyst, right side: Secondary | ICD-10-CM | POA: Diagnosis present

## 2018-12-18 ENCOUNTER — Emergency Department (HOSPITAL_COMMUNITY)
Admission: EM | Admit: 2018-12-18 | Discharge: 2018-12-19 | Disposition: A | Discharge status: Home / Self Care | Payer: No Typology Code available for payment source | Attending: Emergency Medicine | Admitting: Emergency Medicine

## 2018-12-18 ENCOUNTER — Encounter (HOSPITAL_COMMUNITY): Payer: Self-pay

## 2018-12-18 ENCOUNTER — Ambulatory Visit (HOSPITAL_COMMUNITY)
Admission: EM | Admit: 2018-12-18 | Discharge: 2018-12-18 | Disposition: A | Discharge status: Home / Self Care | Payer: No Typology Code available for payment source | Attending: Emergency Medicine | Admitting: Emergency Medicine

## 2018-12-18 DIAGNOSIS — Z0441 Encounter for examination and observation following alleged adult rape: Secondary | ICD-10-CM | POA: Insufficient documentation

## 2018-12-18 DIAGNOSIS — Z79899 Other long term (current) drug therapy: Secondary | ICD-10-CM | POA: Insufficient documentation

## 2018-12-18 DIAGNOSIS — F322 Major depressive disorder, single episode, severe without psychotic features: Secondary | ICD-10-CM | POA: Insufficient documentation

## 2018-12-18 DIAGNOSIS — F17210 Nicotine dependence, cigarettes, uncomplicated: Secondary | ICD-10-CM | POA: Diagnosis not present

## 2018-12-18 DIAGNOSIS — T7421XA Adult sexual abuse, confirmed, initial encounter: Secondary | ICD-10-CM

## 2018-12-18 DIAGNOSIS — R45851 Suicidal ideations: Secondary | ICD-10-CM | POA: Diagnosis not present

## 2018-12-18 DIAGNOSIS — T7621XA Adult sexual abuse, suspected, initial encounter: Secondary | ICD-10-CM | POA: Diagnosis not present

## 2018-12-18 DIAGNOSIS — F909 Attention-deficit hyperactivity disorder, unspecified type: Secondary | ICD-10-CM | POA: Diagnosis not present

## 2018-12-18 LAB — CBC WITH DIFFERENTIAL/PLATELET
Abs Immature Granulocytes: 0.03 10*3/uL (ref 0.00–0.07)
Basophils Absolute: 0.1 10*3/uL (ref 0.0–0.1)
Basophils Relative: 1 %
Eosinophils Absolute: 0.1 10*3/uL (ref 0.0–0.5)
Eosinophils Relative: 1 %
HEMATOCRIT: 41.9 % (ref 36.0–46.0)
Hemoglobin: 13.7 g/dL (ref 12.0–15.0)
IMMATURE GRANULOCYTES: 0 %
Lymphocytes Relative: 25 %
Lymphs Abs: 2.7 10*3/uL (ref 0.7–4.0)
MCH: 28.8 pg (ref 26.0–34.0)
MCHC: 32.7 g/dL (ref 30.0–36.0)
MCV: 88.2 fL (ref 80.0–100.0)
MONOS PCT: 4 %
Monocytes Absolute: 0.5 10*3/uL (ref 0.1–1.0)
NEUTROS PCT: 69 %
Neutro Abs: 7.5 10*3/uL (ref 1.7–7.7)
Platelets: 348 10*3/uL (ref 150–400)
RBC: 4.75 MIL/uL (ref 3.87–5.11)
RDW: 11.5 % (ref 11.5–15.5)
WBC: 10.9 10*3/uL — ABNORMAL HIGH (ref 4.0–10.5)
nRBC: 0 % (ref 0.0–0.2)

## 2018-12-18 LAB — POC URINE PREG, ED: Preg Test, Ur: NEGATIVE

## 2018-12-18 LAB — COMPREHENSIVE METABOLIC PANEL
ALT: 13 U/L (ref 0–44)
ANION GAP: 7 (ref 5–15)
AST: 19 U/L (ref 15–41)
Albumin: 3.8 g/dL (ref 3.5–5.0)
Alkaline Phosphatase: 68 U/L (ref 38–126)
BUN: 10 mg/dL (ref 6–20)
CO2: 25 mmol/L (ref 22–32)
Calcium: 9.2 mg/dL (ref 8.9–10.3)
Chloride: 108 mmol/L (ref 98–111)
Creatinine, Ser: 0.81 mg/dL (ref 0.44–1.00)
GFR calc non Af Amer: >60 mL/min (ref >60)
Glucose, Bld: 86 mg/dL (ref 70–99)
Potassium: 3.8 mmol/L (ref 3.5–5.1)
SODIUM: 140 mmol/L (ref 135–145)
Total Bilirubin: 0.5 mg/dL (ref 0.3–1.2)
Total Protein: 6.8 g/dL (ref 6.5–8.1)

## 2018-12-18 LAB — RAPID URINE DRUG SCREEN, HOSP PERFORMED
Amphetamines: NOT DETECTED
Barbiturates: NOT DETECTED
Benzodiazepines: NOT DETECTED
COCAINE: NOT DETECTED
OPIATES: NOT DETECTED
Tetrahydrocannabinol: POSITIVE — AB

## 2018-12-18 LAB — I-STAT BETA HCG BLOOD, ED (MC, WL, AP ONLY): I-stat hCG, quantitative: <5 m[IU]/mL (ref <5)

## 2018-12-18 LAB — SALICYLATE LEVEL

## 2018-12-18 LAB — ETHANOL: Alcohol, Ethyl (B): <10 mg/dL (ref <10)

## 2018-12-18 LAB — ACETAMINOPHEN LEVEL: Acetaminophen (Tylenol), Serum: <10 ug/mL — ABNORMAL LOW (ref 10–30)

## 2018-12-18 MED ORDER — LIDOCAINE HCL (PF) 1 % IJ SOLN
0.9000 mL | Freq: Once | INTRAMUSCULAR | Status: AC
  Administered 2018-12-18: 0.9 mL

## 2018-12-18 MED ORDER — CEFTRIAXONE SODIUM 250 MG IJ SOLR
250.0000 mg | Freq: Once | INTRAMUSCULAR | Status: AC
  Administered 2018-12-18: 250 mg via INTRAMUSCULAR
  Filled 2018-12-18: qty 250

## 2018-12-18 MED ORDER — AZITHROMYCIN 250 MG PO TABS
1000.0000 mg | ORAL_TABLET | Freq: Once | ORAL | Status: AC
  Administered 2018-12-18: 1000 mg via ORAL
  Filled 2018-12-18: qty 4

## 2018-12-18 MED ORDER — LIDOCAINE HCL (PF) 1 % IJ SOLN
INTRAMUSCULAR | Status: AC
  Filled 2018-12-18: qty 5

## 2018-12-18 MED ORDER — LORAZEPAM 2 MG/ML IJ SOLN
2.0000 mg | Freq: Once | INTRAMUSCULAR | Status: AC
  Administered 2018-12-18: 2 mg via INTRAVENOUS
  Filled 2018-12-18: qty 1
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
## 2018-12-18 NOTE — ED Notes (Signed)
Family given all belongings including cell phone and took belongings home (2 bags). Family made aware pt will be re-assessed in morning and they were made aware of visiting hours.

## 2018-12-18 NOTE — ED Notes (Signed)
TTS placed at bedside.  

## 2018-12-18 NOTE — ED Notes (Addendum)
Per [facility], recommendation is hold overnight and re-assess in AM. EDP aware and made pt aware. Pt wanded by security.

## 2018-12-18 NOTE — ED Notes (Signed)
Pt now endorsing SI. EDP aware, orders placed.

## 2018-12-18 NOTE — SANE Note (Signed)
The SANE/FNE Teacher, music) consult has been completed. The primary  RN and Apolinar Junes, [place] have been notified. Please contact the SANE/FNE nurse on call (listed in Amion) with any further concerns.

## 2018-12-18 NOTE — ED Notes (Signed)
Pt incredibly anxious at this time regarding disposition. EDP made aware.

## 2018-12-18 NOTE — ED Notes (Signed)
Sane,RN, with pt. Pt to be changed into scrubs once cleared by SANE RN.

## 2018-12-18 NOTE — ED Notes (Signed)
Gave happy meal and ginger ale to pt per SANE RN.

## 2018-12-18 NOTE — ED Notes (Signed)
Hillery Jacks, NP, recommends overnight observation for stabilization and safety with psych consult in the morning. Megan, RN, informed of disposition.
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
## 2018-12-18 NOTE — ED Notes (Signed)
Pt changed into purple scrubs. Pt wearing glasses. All belongings given to family.
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
## 2018-12-19 ENCOUNTER — Other Ambulatory Visit: Payer: Self-pay

## 2018-12-19 MED ORDER — MIRTAZAPINE 15 MG PO TABS: 7.5000 mg | ORAL_TABLET | Freq: Every day | ORAL | Status: DC

## 2018-12-19 MED ORDER — ARIPIPRAZOLE 10 MG PO TABS
20.0000 mg | ORAL_TABLET | Freq: Every day | ORAL | Status: DC
  Administered 2018-12-19: 20 mg via ORAL
  Filled 2018-12-19: qty 2

## 2018-12-19 MED ORDER — IBUPROFEN 400 MG PO TABS: 400.0000 mg | ORAL_TABLET | Freq: Four times a day (QID) | ORAL | Status: DC | PRN

## 2018-12-19 MED ORDER — FLUOXETINE HCL 20 MG PO CAPS
20.0000 mg | ORAL_CAPSULE | Freq: Every day | ORAL | Status: DC
  Administered 2018-12-19: 20 mg via ORAL
  Filled 2018-12-19: qty 1

## 2018-12-19 MED ORDER — DICLOFENAC SODIUM 75 MG PO TBEC
75.0000 mg | DELAYED_RELEASE_TABLET | Freq: Two times a day (BID) | ORAL | Status: DC
  Administered 2018-12-19: 75 mg via ORAL
  Filled 2018-12-19 (×3): qty 1

## 2018-12-19 MED ORDER — HYDROXYZINE HCL 25 MG PO TABS
25.0000 mg | ORAL_TABLET | Freq: Two times a day (BID) | ORAL | Status: DC
  Administered 2018-12-19: 25 mg via ORAL
  Filled 2018-12-19: qty 1

## 2018-12-19 MED ORDER — NORGESTIMATE-ETH ESTRADIOL 0.25-35 MG-MCG PO TABS: 1.0000 | ORAL_TABLET | Freq: Every day | ORAL | Status: DC

## 2018-12-19 MED ORDER — PRAZOSIN HCL 2 MG PO CAPS: 2.0000 mg | ORAL_CAPSULE | Freq: Every day | ORAL | Status: DC
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
## 2018-12-19 NOTE — ED Notes (Signed)
Lunch tray ordered 

## 2018-12-19 NOTE — SANE Note (Signed)
Follow-up Phone Call  Patient gives verbal consent for a FNE/SANE follow-up phone call in 48-72 hours: no Patient's telephone number: n/a Patient gives verbal consent to leave voicemail at the phone number listed above: no DO NOT CALL between the hours of: n/a

## 2018-12-19 NOTE — ED Notes (Signed)
Pt called her grandparents x 2 - advised they are leaving their house now.

## 2018-12-19 NOTE — ED Notes (Signed)
Pt requested to go sit in lobby while waiting for her grandparents. Sitter w/pt d/t pt wearing burgundy scrubs d/t she does not have any clothing in ED.
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
## 2018-12-19 NOTE — ED Notes (Addendum)
D/C paperwork discussed and given to pt. Voiced understanding of follow up. Pt waiting for her grandparents to come pick her up. NO Belongings noted for pt in ED. Pt reported her family took them.

## 2018-12-19 NOTE — ED Notes (Signed)
Pt requesting anti-anxiety med - aware may be given after [facility] reviews her meds. Voiced understanding Denies SI/HI and AH/VH.

## 2018-12-20 ENCOUNTER — Telehealth: Payer: Self-pay

## 2018-12-20 NOTE — Telephone Encounter (Signed)
-----   Message from Levie Heritage, DO sent at 12/17/2018 11:54 AM EST ----- [place] normal. Please let pt know. I tried to leave a message, but voicemail full.

## 2018-12-20 NOTE — Telephone Encounter (Signed)
Called pt with results, unable to leave message due to vm not being set up. Asaiah Hunnicutt l Braylyn Kalter, CMA

## 2018-12-20 NOTE — Telephone Encounter (Signed)
Pt called the office back. Informed pt that [place] results are normal. Understanding was voiced.  Misty Jimenez l Sekou Zuckerman, CMA

## 2018-12-31 ENCOUNTER — Ambulatory Visit: Payer: Commercial Managed Care - PPO | Admitting: Family Medicine

## 2019-01-03 ENCOUNTER — Telehealth: Payer: Self-pay

## 2019-01-03 NOTE — Telephone Encounter (Signed)
Pt called the office stating that she is having severe cramping and right sided abdominal pain.Pt states that she is taking diclofenac 75 mg for pain but it hasn't provided any relief. Advised pt to call [facility] OP clinic to schedule an appointment because we are unable to bring her in to be seen today.Understanding was voiced. Anysha Frappier l Justine Dines, CMA

## 2019-01-10 ENCOUNTER — Ambulatory Visit: Payer: Commercial Managed Care - PPO | Admitting: Family Medicine

## 2019-01-11 ENCOUNTER — Other Ambulatory Visit: Payer: Self-pay

## 2019-01-11 ENCOUNTER — Telehealth: Payer: Self-pay

## 2019-01-11 ENCOUNTER — Ambulatory Visit (INDEPENDENT_AMBULATORY_CARE_PROVIDER_SITE_OTHER): Payer: No Typology Code available for payment source | Admitting: Family Medicine

## 2019-01-11 VITALS — BP 98/72 | HR 87 | Temp 98.3°F | Resp 18 | Wt 257.6 lb

## 2019-01-11 DIAGNOSIS — F4310 Post-traumatic stress disorder, unspecified: Secondary | ICD-10-CM

## 2019-01-11 DIAGNOSIS — T7421XS Adult sexual abuse, confirmed, sequela: Secondary | ICD-10-CM | POA: Diagnosis not present

## 2019-01-11 DIAGNOSIS — T7421XD Adult sexual abuse, confirmed, subsequent encounter: Secondary | ICD-10-CM | POA: Diagnosis not present

## 2019-01-11 DIAGNOSIS — G4700 Insomnia, unspecified: Secondary | ICD-10-CM | POA: Diagnosis not present

## 2019-01-11 MED ORDER — PRAZOSIN HCL 1 MG PO CAPS: 2.0000 mg | ORAL_CAPSULE | Freq: Every day | ORAL | 0 refills | Status: DC

## 2019-01-11 MED ORDER — ARIPIPRAZOLE 20 MG PO TABS: 20.0000 mg | ORAL_TABLET | Freq: Every day | ORAL | 0 refills | Status: DC

## 2019-01-11 MED ORDER — ALBUTEROL SULFATE HFA 108 (90 BASE) MCG/ACT IN AERS: 2.0000 | INHALATION_SPRAY | Freq: Four times a day (QID) | RESPIRATORY_TRACT | 1 refills | Status: DC | PRN

## 2019-01-11 MED ORDER — MIRTAZAPINE 15 MG PO TABS: 15.0000 mg | ORAL_TABLET | Freq: Every day | ORAL | 0 refills | Status: DC

## 2019-01-11 NOTE — Telephone Encounter (Signed)
PA initiated via Covermymeds; KEY: [id number]. Awaiting determination.

## 2019-01-11 NOTE — Patient Instructions (Signed)

## 2019-01-12 ENCOUNTER — Other Ambulatory Visit: Payer: Self-pay | Admitting: Family Medicine

## 2019-01-12 LAB — CBC WITH DIFFERENTIAL/PLATELET
Basophils Absolute: 0.2 10*3/uL — ABNORMAL HIGH (ref 0.0–0.1)
Basophils Relative: 1.7 % (ref 0.0–3.0)
EOS ABS: 0.1 10*3/uL (ref 0.0–0.7)
EOS PCT: 1 % (ref 0.0–5.0)
HCT: 40.2 % (ref 36.0–46.0)
Hemoglobin: 14 g/dL (ref 12.0–15.0)
Lymphocytes Relative: 24.1 % (ref 12.0–46.0)
Lymphs Abs: 2.4 10*3/uL (ref 0.7–4.0)
MCHC: 34.7 g/dL (ref 30.0–36.0)
MCV: 87.3 fl (ref 78.0–100.0)
MONO ABS: 0.5 10*3/uL (ref 0.1–1.0)
Monocytes Relative: 5.3 % (ref 3.0–12.0)
Neutro Abs: 6.9 10*3/uL (ref 1.4–7.7)
Neutrophils Relative %: 67.9 % (ref 43.0–77.0)
Platelets: 395 10*3/uL (ref 150.0–400.0)
RBC: 4.61 Mil/uL (ref 3.87–5.11)
RDW: 12.4 % (ref 11.5–14.6)
WBC: 10.1 10*3/uL (ref 4.5–10.5)

## 2019-01-12 LAB — HIV ANTIBODY (ROUTINE TESTING W REFLEX): HIV 1&2 Ab, 4th Generation: NONREACTIVE

## 2019-01-12 LAB — RPR: RPR: NONREACTIVE
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
## 2019-01-17 NOTE — Telephone Encounter (Signed)
PA approved.

## 2019-01-18 ENCOUNTER — Telehealth: Payer: Self-pay

## 2019-01-18 NOTE — Telephone Encounter (Signed)
Pt will be notifed via mail. 

## 2019-02-08 ENCOUNTER — Other Ambulatory Visit: Payer: Self-pay | Admitting: Family Medicine

## 2019-02-25 ENCOUNTER — Other Ambulatory Visit: Payer: Self-pay | Admitting: Family Medicine

## 2019-03-11 ENCOUNTER — Other Ambulatory Visit: Payer: Self-pay

## 2019-04-03 ENCOUNTER — Other Ambulatory Visit: Payer: Self-pay | Admitting: Family Medicine

## 2019-04-04 ENCOUNTER — Other Ambulatory Visit: Payer: Self-pay | Admitting: Family Medicine

## 2019-05-03 ENCOUNTER — Other Ambulatory Visit: Payer: Self-pay | Admitting: Family Medicine

## 2019-05-24 ENCOUNTER — Other Ambulatory Visit: Payer: Self-pay | Admitting: Family Medicine

## 2019-05-25 ENCOUNTER — Other Ambulatory Visit: Payer: Self-pay | Admitting: Family Medicine

## 2019-06-18 ENCOUNTER — Other Ambulatory Visit: Payer: Self-pay | Admitting: Family Medicine

## 2019-06-23 ENCOUNTER — Ambulatory Visit: Payer: Self-pay | Admitting: Family Medicine

## 2019-06-27 ENCOUNTER — Other Ambulatory Visit: Payer: Self-pay

## 2019-06-28 ENCOUNTER — Encounter: Payer: Self-pay | Admitting: Family Medicine

## 2019-06-28 ENCOUNTER — Ambulatory Visit (INDEPENDENT_AMBULATORY_CARE_PROVIDER_SITE_OTHER): Payer: Self-pay | Admitting: Family Medicine

## 2019-06-28 ENCOUNTER — Telehealth: Payer: Self-pay

## 2019-06-28 DIAGNOSIS — Z720 Tobacco use: Secondary | ICD-10-CM

## 2019-06-28 MED ORDER — NICOTINE 14 MG/24HR TD PT24: 14.0000 mg | MEDICATED_PATCH | Freq: Every day | TRANSDERMAL | 0 refills | Status: DC

## 2019-06-28 MED ORDER — NICOTINE 7 MG/24HR TD PT24: 7.0000 mg | MEDICATED_PATCH | Freq: Every day | TRANSDERMAL | 0 refills | Status: DC

## 2019-06-28 MED ORDER — NICOTINE 21 MG/24HR TD PT24: 21.0000 mg | MEDICATED_PATCH | Freq: Every day | TRANSDERMAL | 0 refills | Status: DC

## 2019-06-28 NOTE — Telephone Encounter (Signed)
Please have pt contact her ins co and find out what medication/supplement they will cover for tobacco cessation. TY.

## 2019-06-28 NOTE — Telephone Encounter (Signed)
Copied from [place] [id number]. Topic: General - Other >> Jun 28, 2019  9:57 AM Mcneil, Ja-Kwan wrote: Reason for CRM: Pt stated that a prior authorization is needed for the nicotine (NICODERM CQ - DOSED IN MG/24 HR) 7 mg/24hr patch and nicotine (NICODERM CQ - DOSED IN MG/24 HOURS) 21 mg/24hr patch. Pt stated she needs this approved before 12 pm because her ride will be back to take her to the pharmacy at that time.

## 2019-06-28 NOTE — Telephone Encounter (Signed)
PA initiated via Covermymeds; KEY: [id number]. Awaiting determination.

## 2019-06-28 NOTE — Telephone Encounter (Signed)
PA denied. Medication group not a covered benefit of insurance plan.

## 2019-06-28 NOTE — Progress Notes (Signed)
No chief complaint on file.   Subjective: Patient is a 23 y.o. female here for smoking cessation. Due to COVID-19 pandemic, we are interacting via telephone. I verified patient's ID using 2 identifiers. Patient agreed to proceed with visit via this method. Patient is at home, I am at office. Patient and I are present for visit.   Pt has a hx of smoking 16-20 cigarettes since she was 18. She is interested in quitting. She has tried various forms of nicotine supp in past and did pretty well. She did not do well with bupropion. She has a hx of PTSD and would prefer not to have vivid dreams. She lives w her mother who does not smoke.   ROS: Lungs: Denies SOB   Past Medical History:  Diagnosis Date  . Abdominal pain, chronic, right lower quadrant 08/03/2013  . Abdominal pain, recurrent    With Headache  . Acute pyelonephritis 05/07/2016   kidney surgery at 61 months of age  . ADHD (attention deficit hyperactivity disorder)   . Allergy   . Anxiety   . Depression   . Endometriosis   . Family history of adverse reaction to anesthesia    "grandmother gets PONV"  . GE reflux 12/16/2011  . Hyperlipidemia   . Kidney disease 05/07/2016  . PCOS (polycystic ovarian syndrome)   . Preventative health care 11/04/2016  . Reflux, vesicoureteral   . Wrist pain, left 11/04/2016    Objective: No conversational dyspnea Age appropriate judgment and insight Nml affect and mood  Assessment and Plan: Tobacco abuse - Plan: nicotine (NICODERM CQ - DOSED IN MG/24 HOURS) 21 mg/24hr patch, nicotine (NICODERM CQ - DOSED IN MG/24 HOURS) 14 mg/24hr patch, nicotine (NICODERM CQ - DOSED IN MG/24 HR) 7 mg/24hr patch  6 weeks at 21 mg/d dose, 2 weeks at 14 mg/d, and 2 weeks at 7 mg/d. F/u in 1 mo to check on status. She will let us know if it is too expensive, but she might have to call ins co. Avoided Chantix due to concern for vivid dreams. Total time spent: 11 min The patient voiced understanding and agreement to the  plan.  H[place], DO 06/28/19  8:18 AM

## 2019-06-28 NOTE — Telephone Encounter (Signed)
She has called already her insurance and they will not cover anything. She is going to go ahead and just pay for it herself.

## 2019-07-30 ENCOUNTER — Emergency Department (HOSPITAL_COMMUNITY)
Admission: EM | Admit: 2019-07-30 | Discharge: 2019-07-31 | Disposition: A | Discharge status: Home / Self Care | Payer: Self-pay | Attending: Emergency Medicine | Admitting: Emergency Medicine

## 2019-07-30 ENCOUNTER — Encounter (HOSPITAL_COMMUNITY): Payer: Self-pay | Admitting: Emergency Medicine

## 2019-07-30 ENCOUNTER — Other Ambulatory Visit: Payer: Self-pay

## 2019-07-30 DIAGNOSIS — F17210 Nicotine dependence, cigarettes, uncomplicated: Secondary | ICD-10-CM | POA: Insufficient documentation

## 2019-07-30 DIAGNOSIS — R45851 Suicidal ideations: Secondary | ICD-10-CM | POA: Insufficient documentation

## 2019-07-30 DIAGNOSIS — F332 Major depressive disorder, recurrent severe without psychotic features: Secondary | ICD-10-CM | POA: Insufficient documentation

## 2019-07-30 DIAGNOSIS — Z79899 Other long term (current) drug therapy: Secondary | ICD-10-CM | POA: Insufficient documentation

## 2019-07-30 DIAGNOSIS — L089 Local infection of the skin and subcutaneous tissue, unspecified: Secondary | ICD-10-CM | POA: Insufficient documentation

## 2019-07-30 DIAGNOSIS — Z20828 Contact with and (suspected) exposure to other viral communicable diseases: Secondary | ICD-10-CM | POA: Insufficient documentation

## 2019-07-30 LAB — COMPREHENSIVE METABOLIC PANEL
ALT: 11 U/L (ref 0–44)
AST: 21 U/L (ref 15–41)
Albumin: 3.8 g/dL (ref 3.5–5.0)
Alkaline Phosphatase: 79 U/L (ref 38–126)
Anion gap: 8 (ref 5–15)
BUN: 12 mg/dL (ref 6–20)
CO2: 25 mmol/L (ref 22–32)
Calcium: 9.3 mg/dL (ref 8.9–10.3)
Chloride: 106 mmol/L (ref 98–111)
Creatinine, Ser: 0.72 mg/dL (ref 0.44–1.00)
GFR calc Af Amer: >60 mL/min (ref >60)
GFR calc non Af Amer: >60 mL/min (ref >60)
Glucose, Bld: 89 mg/dL (ref 70–99)
Potassium: 4.2 mmol/L (ref 3.5–5.1)
Sodium: 139 mmol/L (ref 135–145)
Total Bilirubin: 0.3 mg/dL (ref 0.3–1.2)
Total Protein: 7.1 g/dL (ref 6.5–8.1)

## 2019-07-30 LAB — RAPID URINE DRUG SCREEN, HOSP PERFORMED
Amphetamines: NOT DETECTED
Barbiturates: NOT DETECTED
Benzodiazepines: POSITIVE — AB
Cocaine: NOT DETECTED
Opiates: NOT DETECTED
Tetrahydrocannabinol: POSITIVE — AB

## 2019-07-30 LAB — PREGNANCY, URINE: Preg Test, Ur: NEGATIVE

## 2019-07-30 LAB — CBC
HCT: 40.5 % (ref 36.0–46.0)
Hemoglobin: 13.2 g/dL (ref 12.0–15.0)
MCH: 29.5 pg (ref 26.0–34.0)
MCHC: 32.6 g/dL (ref 30.0–36.0)
MCV: 90.4 fL (ref 80.0–100.0)
Platelets: 462 10*3/uL — ABNORMAL HIGH (ref 150–400)
RBC: 4.48 MIL/uL (ref 3.87–5.11)
RDW: 12.5 % (ref 11.5–15.5)
WBC: 12.5 10*3/uL — ABNORMAL HIGH (ref 4.0–10.5)
nRBC: 0 % (ref 0.0–0.2)

## 2019-07-30 LAB — SALICYLATE LEVEL: Salicylate Lvl: <7 mg/dL (ref 2.8–30.0)

## 2019-07-30 LAB — ETHANOL: Alcohol, Ethyl (B): <10 mg/dL (ref <10)

## 2019-07-30 LAB — ACETAMINOPHEN LEVEL: Acetaminophen (Tylenol), Serum: <10 ug/mL — ABNORMAL LOW (ref 10–30)

## 2019-07-30 LAB — SARS CORONAVIRUS 2 BY RT PCR (HOSPITAL ORDER, PERFORMED IN ~~LOC~~ HOSPITAL LAB): SARS Coronavirus 2: NEGATIVE

## 2019-07-30 MED ORDER — DOXYCYCLINE HYCLATE 100 MG PO TABS
100.0000 mg | ORAL_TABLET | Freq: Once | ORAL | Status: AC
  Administered 2019-07-30: 16:00:00 100 mg via ORAL
  Filled 2019-07-30: qty 1

## 2019-07-30 MED ORDER — ONDANSETRON HCL 4 MG PO TABS: 4.0000 mg | ORAL_TABLET | Freq: Three times a day (TID) | ORAL | Status: DC | PRN

## 2019-07-30 MED ORDER — ALUM & MAG HYDROXIDE-SIMETH 200-200-20 MG/5ML PO SUSP: 30.0000 mL | Freq: Four times a day (QID) | ORAL | Status: DC | PRN

## 2019-07-30 MED ORDER — ACETAMINOPHEN 325 MG PO TABS
650.0000 mg | ORAL_TABLET | ORAL | Status: DC | PRN
  Administered 2019-07-31: 650 mg via ORAL
  Filled 2019-07-30 (×2): qty 2

## 2019-07-30 MED ORDER — DOXYCYCLINE HYCLATE 100 MG PO TABS
100.0000 mg | ORAL_TABLET | Freq: Two times a day (BID) | ORAL | Status: DC
  Administered 2019-07-30 – 2019-07-31 (×2): 100 mg via ORAL
  Filled 2019-07-30 (×2): qty 1

## 2019-07-30 NOTE — ED Notes (Signed)
EDP at bedside  

## 2019-07-30 NOTE — ED Notes (Signed)
TTS consult in process. 

## 2019-07-30 NOTE — ED Notes (Signed)
Room stripped. Pt changed into paper scrubs.

## 2019-07-30 NOTE — ED Notes (Addendum)
Pt given graham crackers and peanut butter and ginger ale.

## 2019-07-30 NOTE — ED Notes (Signed)
Avasyst initiated. 

## 2019-07-30 NOTE — ED Notes (Signed)
Pt woke up to use restroom. Telesitter called to verify I was aware as I was attempting to wave to camera. Pt remains within clear visual of this nurse at the nurse station.
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
## 2019-07-30 NOTE — ED Notes (Signed)
Pt continuously stopping staff to give her food. Explained to pt a snack had already been given and a dinner tray had been ordered.
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
## 2019-07-30 NOTE — ED Notes (Signed)
Security at bedside to wand patient. Belongings placed in locker.

## 2019-07-31 MED ORDER — ONDANSETRON 4 MG PO TBDP
4.0000 mg | ORAL_TABLET | Freq: Once | ORAL | Status: AC
  Administered 2019-07-31: 07:00:00 4 mg via ORAL
  Filled 2019-07-31: qty 1

## 2019-07-31 MED ORDER — ONDANSETRON 4 MG PO TBDP
4.0000 mg | ORAL_TABLET | Freq: Once | ORAL | Status: AC
  Administered 2019-07-31: 4 mg via ORAL
  Filled 2019-07-31: qty 1

## 2019-07-31 MED ORDER — ARIPIPRAZOLE 5 MG PO TABS
20.0000 mg | ORAL_TABLET | Freq: Every day | ORAL | Status: DC
  Administered 2019-07-31: 20 mg via ORAL
  Filled 2019-07-31: qty 4

## 2019-07-31 MED ORDER — NYSTATIN 100000 UNIT/GM EX POWD
Freq: Two times a day (BID) | CUTANEOUS | Status: DC
  Filled 2019-07-31: qty 15

## 2019-07-31 MED ORDER — DOXYCYCLINE HYCLATE 100 MG PO CAPS: 100.0000 mg | ORAL_CAPSULE | Freq: Two times a day (BID) | ORAL | 0 refills | Status: DC

## 2019-07-31 MED ORDER — PROPRANOLOL HCL 10 MG PO TABS
10.0000 mg | ORAL_TABLET | Freq: Three times a day (TID) | ORAL | Status: DC
  Administered 2019-07-31: 10 mg via ORAL
  Filled 2019-07-31: qty 1

## 2019-07-31 MED ORDER — LAMOTRIGINE 25 MG PO TABS
100.0000 mg | ORAL_TABLET | Freq: Every day | ORAL | Status: DC
  Administered 2019-07-31: 100 mg via ORAL
  Filled 2019-07-31: qty 4

## 2019-07-31 MED ORDER — ACETAMINOPHEN 325 MG PO TABS
650.0000 mg | ORAL_TABLET | Freq: Once | ORAL | Status: AC
  Administered 2019-07-31: 14:00:00 650 mg via ORAL
  Filled 2019-07-31: qty 2

## 2019-07-31 MED ORDER — FLUOXETINE HCL 20 MG PO CAPS
20.0000 mg | ORAL_CAPSULE | Freq: Every day | ORAL | Status: DC
  Administered 2019-07-31: 20 mg via ORAL
  Filled 2019-07-31: qty 1

## 2019-07-31 MED ORDER — ALBUTEROL SULFATE HFA 108 (90 BASE) MCG/ACT IN AERS: 2.0000 | INHALATION_SPRAY | Freq: Four times a day (QID) | RESPIRATORY_TRACT | Status: DC | PRN

## 2019-07-31 MED ORDER — PRAZOSIN HCL 1 MG PO CAPS
2.0000 mg | ORAL_CAPSULE | Freq: Every day | ORAL | Status: DC
  Filled 2019-07-31: qty 2

## 2019-07-31 NOTE — ED Notes (Signed)
TTS at bedside. 

## 2019-07-31 NOTE — ED Notes (Signed)
Patient is resting comfortably. 

## 2019-07-31 NOTE — ED Notes (Signed)
Pt states she has been having vaginal bleeding x couple of weeks. Pt states she has history of PCOS. Dark brown spot of blood noted on cover size of softball. Pt states she has had to change her pad every 6 hours today. Dr Eulis Foster made aware.

## 2019-07-31 NOTE — ED Notes (Signed)
Patient repeatedly calling out stating she needs something to eat then going to the bathroom. Patient has been heard from outside of the bathroom trying to vomit.
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
## 2019-07-31 NOTE — BHH Counselor (Signed)
Disposition:   Priscille Loveless, NP, cont to recommend inpt tx  Per Farris Has, patient is inappropriate for [facility] d/t previous admission not want to because when time, attachment to physicians.

## 2019-07-31 NOTE — ED Notes (Signed)
Pt at desk threatening to leave. Dr Eulis Foster notified.

## 2019-07-31 NOTE — Progress Notes (Signed)
Patient meets criteria for inpatient treatment. Per Colin Broach, there are no appropriate beds available at [facility] currently. CSW faxed referrals to the following facilities for review:  [facility], [facility], [place], [place], [place], [place], [place], [place], [place], [place], [place], [place], [place].   TTS will continue to seek bed placement.   Maxie Better, MSW, LCSW Clinical Social Worker 07/31/2019 8:42 AM

## 2019-07-31 NOTE — Discharge Instructions (Addendum)
Call your therapist for an appointment to be seen as soon as possible.  We are giving her prescription for the foot infection.  Make sure you keep it clean with soap and water daily.  Follow-up with your primary care doctor, as needed for problems.

## 2019-07-31 NOTE — ED Notes (Signed)
Patient called out stating that she needs to have her wounds wrapped up. Patient had several small scabs on lower legs. Areas were intact yesterday after this nurse assessed her. Patient states scabs "rubbed off" while in bed.  Patient also stated she would like to talk to someone about getting taking her regular medication.  She also states that she has infections under her skin around her breasts and abdomin.

## 2019-07-31 NOTE — ED Notes (Signed)
Pt found vomiting in bathroom.   Pt back in room.

## 2019-07-31 NOTE — ED Notes (Signed)
Behavioral health called and transferred to [place].

## 2019-07-31 NOTE — ED Notes (Signed)
EDP at bedside  
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
## 2019-07-31 NOTE — ED Notes (Signed)
Patient complains of itching and burning under breasts and at waistline under abdominal protuberance.

## 2019-07-31 NOTE — ED Notes (Signed)
Per EDP will discharge pt.

## 2019-08-01 ENCOUNTER — Ambulatory Visit (HOSPITAL_COMMUNITY)
Admission: RE | Admit: 2019-08-01 | Discharge: 2019-08-01 | Disposition: A | Discharge status: Home / Self Care | Payer: Federal, State, Local not specified - Other | Attending: Psychiatry | Admitting: Psychiatry

## 2019-08-01 ENCOUNTER — Other Ambulatory Visit: Payer: Self-pay

## 2019-08-01 ENCOUNTER — Emergency Department (HOSPITAL_COMMUNITY): Payer: Self-pay

## 2019-08-01 ENCOUNTER — Encounter (HOSPITAL_COMMUNITY): Payer: Self-pay

## 2019-08-01 ENCOUNTER — Emergency Department (HOSPITAL_COMMUNITY)
Admission: EM | Admit: 2019-08-01 | Discharge: 2019-08-02 | Disposition: A | Discharge status: Other Acute Inpatient Hospital | Payer: Self-pay | Attending: Emergency Medicine | Admitting: Emergency Medicine

## 2019-08-01 DIAGNOSIS — F314 Bipolar disorder, current episode depressed, severe, without psychotic features: Secondary | ICD-10-CM | POA: Diagnosis present

## 2019-08-01 DIAGNOSIS — F332 Major depressive disorder, recurrent severe without psychotic features: Secondary | ICD-10-CM | POA: Insufficient documentation

## 2019-08-01 DIAGNOSIS — Z79899 Other long term (current) drug therapy: Secondary | ICD-10-CM | POA: Insufficient documentation

## 2019-08-01 DIAGNOSIS — Z008 Encounter for other general examination: Secondary | ICD-10-CM | POA: Insufficient documentation

## 2019-08-01 DIAGNOSIS — F4310 Post-traumatic stress disorder, unspecified: Secondary | ICD-10-CM | POA: Diagnosis present

## 2019-08-01 DIAGNOSIS — R45851 Suicidal ideations: Secondary | ICD-10-CM | POA: Insufficient documentation

## 2019-08-01 DIAGNOSIS — N938 Other specified abnormal uterine and vaginal bleeding: Secondary | ICD-10-CM

## 2019-08-01 DIAGNOSIS — Z113 Encounter for screening for infections with a predominantly sexual mode of transmission: Secondary | ICD-10-CM | POA: Insufficient documentation

## 2019-08-01 DIAGNOSIS — F17210 Nicotine dependence, cigarettes, uncomplicated: Secondary | ICD-10-CM | POA: Insufficient documentation

## 2019-08-01 DIAGNOSIS — N72 Inflammatory disease of cervix uteri: Secondary | ICD-10-CM | POA: Insufficient documentation

## 2019-08-01 LAB — URINALYSIS, ROUTINE W REFLEX MICROSCOPIC
Bilirubin Urine: NEGATIVE
Glucose, UA: NEGATIVE mg/dL
Ketones, ur: 15 mg/dL — AB
Nitrite: POSITIVE — AB
Protein, ur: >300 mg/dL — AB
Specific Gravity, Urine: 1.015 (ref 1.005–1.030)
pH: 8.5 — ABNORMAL HIGH (ref 5.0–8.0)

## 2019-08-01 LAB — COMPREHENSIVE METABOLIC PANEL
ALT: 14 U/L (ref 0–44)
AST: 24 U/L (ref 15–41)
Albumin: 4 g/dL (ref 3.5–5.0)
Alkaline Phosphatase: 85 U/L (ref 38–126)
Anion gap: 8 (ref 5–15)
BUN: 19 mg/dL (ref 6–20)
CO2: 25 mmol/L (ref 22–32)
Calcium: 9 mg/dL (ref 8.9–10.3)
Chloride: 107 mmol/L (ref 98–111)
Creatinine, Ser: 0.81 mg/dL (ref 0.44–1.00)
GFR calc Af Amer: >60 mL/min (ref >60)
GFR calc non Af Amer: >60 mL/min (ref >60)
Glucose, Bld: 103 mg/dL — ABNORMAL HIGH (ref 70–99)
Potassium: 3.9 mmol/L (ref 3.5–5.1)
Sodium: 140 mmol/L (ref 135–145)
Total Bilirubin: 0.2 mg/dL — ABNORMAL LOW (ref 0.3–1.2)
Total Protein: 7.3 g/dL (ref 6.5–8.1)

## 2019-08-01 LAB — RAPID URINE DRUG SCREEN, HOSP PERFORMED
Amphetamines: NOT DETECTED
Barbiturates: NOT DETECTED
Benzodiazepines: NOT DETECTED
Cocaine: NOT DETECTED
Opiates: NOT DETECTED
Tetrahydrocannabinol: POSITIVE — AB

## 2019-08-01 LAB — CBC WITH DIFFERENTIAL/PLATELET
Abs Immature Granulocytes: 0.02 10*3/uL (ref 0.00–0.07)
Basophils Absolute: 0.1 10*3/uL (ref 0.0–0.1)
Basophils Relative: 1 %
Eosinophils Absolute: 0.1 10*3/uL (ref 0.0–0.5)
Eosinophils Relative: 1 %
HCT: 39.9 % (ref 36.0–46.0)
Hemoglobin: 13.3 g/dL (ref 12.0–15.0)
Immature Granulocytes: 0 %
Lymphocytes Relative: 26 %
Lymphs Abs: 2.9 10*3/uL (ref 0.7–4.0)
MCH: 29.8 pg (ref 26.0–34.0)
MCHC: 33.3 g/dL (ref 30.0–36.0)
MCV: 89.3 fL (ref 80.0–100.0)
Monocytes Absolute: 0.7 10*3/uL (ref 0.1–1.0)
Monocytes Relative: 6 %
Neutro Abs: 7.3 10*3/uL (ref 1.7–7.7)
Neutrophils Relative %: 66 %
Platelets: 442 10*3/uL — ABNORMAL HIGH (ref 150–400)
RBC: 4.47 MIL/uL (ref 3.87–5.11)
RDW: 12.5 % (ref 11.5–15.5)
WBC: 11 10*3/uL — ABNORMAL HIGH (ref 4.0–10.5)
nRBC: 0 % (ref 0.0–0.2)

## 2019-08-01 LAB — WET PREP, GENITAL
Clue Cells Wet Prep HPF POC: NONE SEEN
Sperm: NONE SEEN
Trich, Wet Prep: NONE SEEN
WBC, Wet Prep HPF POC: NONE SEEN
Yeast Wet Prep HPF POC: NONE SEEN

## 2019-08-01 LAB — SALICYLATE LEVEL: Salicylate Lvl: <7 mg/dL (ref 2.8–30.0)

## 2019-08-01 LAB — ETHANOL: Alcohol, Ethyl (B): <10 mg/dL (ref <10)

## 2019-08-01 LAB — I-STAT BETA HCG BLOOD, ED (MC, WL, AP ONLY): I-stat hCG, quantitative: <5 m[IU]/mL (ref <5)

## 2019-08-01 LAB — URINALYSIS, MICROSCOPIC (REFLEX): RBC / HPF: >50 RBC/hpf (ref 0–5)

## 2019-08-01 LAB — ACETAMINOPHEN LEVEL: Acetaminophen (Tylenol), Serum: <10 ug/mL — ABNORMAL LOW (ref 10–30)

## 2019-08-01 MED ORDER — METRONIDAZOLE 500 MG PO TABS
500.0000 mg | ORAL_TABLET | Freq: Two times a day (BID) | ORAL | Status: DC
  Administered 2019-08-01 – 2019-08-02 (×3): 500 mg via ORAL
  Filled 2019-08-01 (×3): qty 1

## 2019-08-01 MED ORDER — ALUM & MAG HYDROXIDE-SIMETH 200-200-20 MG/5ML PO SUSP: 30.0000 mL | Freq: Four times a day (QID) | ORAL | Status: DC | PRN

## 2019-08-01 MED ORDER — LIDOCAINE HCL 1 % IJ SOLN
INTRAMUSCULAR | Status: AC
  Filled 2019-08-01: qty 20

## 2019-08-01 MED ORDER — DOXYCYCLINE HYCLATE 100 MG PO TABS
100.0000 mg | ORAL_TABLET | Freq: Two times a day (BID) | ORAL | Status: DC
  Administered 2019-08-01 – 2019-08-02 (×3): 100 mg via ORAL
  Filled 2019-08-01 (×4): qty 1

## 2019-08-01 MED ORDER — ACETAMINOPHEN 325 MG PO TABS: 650.0000 mg | ORAL_TABLET | ORAL | Status: DC | PRN

## 2019-08-01 MED ORDER — HYDROCODONE-ACETAMINOPHEN 5-325 MG PO TABS
1.0000 | ORAL_TABLET | Freq: Once | ORAL | Status: AC
  Administered 2019-08-01: 1 via ORAL
  Filled 2019-08-01: qty 1

## 2019-08-01 MED ORDER — ONDANSETRON HCL 4 MG PO TABS: 4.0000 mg | ORAL_TABLET | Freq: Three times a day (TID) | ORAL | Status: DC | PRN

## 2019-08-01 MED ORDER — CEFTRIAXONE SODIUM 250 MG IJ SOLR
250.0000 mg | Freq: Once | INTRAMUSCULAR | Status: AC
  Administered 2019-08-01: 250 mg via INTRAMUSCULAR
  Filled 2019-08-01: qty 250

## 2019-08-01 MED ORDER — ZOLPIDEM TARTRATE 5 MG PO TABS
5.0000 mg | ORAL_TABLET | Freq: Every evening | ORAL | Status: DC | PRN
  Administered 2019-08-01: 5 mg via ORAL
  Filled 2019-08-01: qty 1

## 2019-08-01 NOTE — ED Notes (Signed)
Patient urinated in bed. Linens changed and new gown placed on patient.  Call bell and phone at bedside.

## 2019-08-01 NOTE — ED Notes (Signed)
Britni, PA at bedside.  

## 2019-08-01 NOTE — Progress Notes (Signed)
Received [place] this PM at the change of shift asleep in bed. She woke up, showered and made several phone calls. She was compliant with her medications and received a snack.

## 2019-08-01 NOTE — BH Assessment (Signed)
BHH Assessment Progress Note  Case was staffed with Rankin NP who recommended patient be observed and monitored.      

## 2019-08-01 NOTE — ED Notes (Signed)
Pt given [place] sandwhich and crackers

## 2019-08-01 NOTE — ED Notes (Signed)
Ultrasound in room
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
## 2019-08-01 NOTE — ED Notes (Signed)
Patient has been using call bell excessively. Patient has called out every 5 minutes for the past 45 min. Called light has been answer and RN or Tech have went to the room every time.

## 2019-08-01 NOTE — BH Assessment (Addendum)
Assessment Note  Misty Jimenez is an 23 y.o. female that presents this date with S/I. Patient voices a plan to cut her wrists. Patient denies any H/I or AVH. Patient has a history of  anxiety and depression. Patient was seen 2 days ago at [facility] and requested to be discharged after patient "got tired of being there." She was recommended for inpatient treatment at that time however after overnight observation patient elected to go home and follow-up outpatient. She returned today due to worsening suicidal thoughts. Patient states she has attempted to kill herself on three occassions with the last attempt occurring the last time she was admitted to [facility], which was 09/2018 (she states she [place] her [place]). Patient states she has a current plan to kill herself by cutting her wrists. Patient denies HI, AVH, access to weapons or current legal issues. Patient does report a history of cutting stating she has been engaging in self harming behavior once or twice a week with last incident over two weeks ago when she self inflicted superficial cuts to her thighs. Patient has a past history of Cannabis use although currently denies. Patient denies currently having a OP and states she cannot recall the last time she was prescribed medications to assist with symptom management. Patient reports ongoing symptoms to include: feeling hopeless and isolating. Patient states she currently lacks any social suppports and is homeless. Patient is requesting a voluntary admission to assist with stabilization. Patient is oriented x4. Her recent and remote memory is intact. Patient was cooperative throughout the assessment process. Patient's insight, judgement, and impulse control is partially impaired at this time. Case was staffed with Rankin NP who recommended patient be observed and monitored.    Diagnosis: F33.2 MDD recurrent without psychotic features, severe.   Past Medical History:  Past Medical History:   Diagnosis Date  . Abdominal pain, chronic, right lower quadrant 08/03/2013  . Abdominal pain, recurrent    With Headache  . Acute pyelonephritis 05/07/2016   kidney surgery at 37 months of age  . ADHD (attention deficit hyperactivity disorder)   . Allergy   . Anxiety   . Depression   . Endometriosis   . Family history of adverse reaction to anesthesia    "grandmother gets PONV"  . GE reflux 12/16/2011  . Hyperlipidemia   . Kidney disease 05/07/2016  . PCOS (polycystic ovarian syndrome)   . Preventative health care 11/04/2016  . Reflux, vesicoureteral   . Wrist pain, left 11/04/2016    Past Surgical History:  Procedure Laterality Date  . KIDNEY SURGERY     As a small child  . TYMPANOSTOMY TUBE PLACEMENT    . URETER SURGERY    . WRIST SURGERY Left 05/2016   10 pins and 2 plates    Family History:  Family History  Problem Relation Age of Onset  . Kidney disease Mother   . Migraines Maternal Aunt   . Diabetes Maternal Grandfather   . Hyperlipidemia Other   . Hypertension Other   . Depression Other   . Alcohol abuse Father        and drug use, heroin, cocaine, marijuana  . Cancer Paternal Aunt        colon in 40s deceased    Social History:  reports that she has been smoking cigarettes. She has a 0.50 pack-year smoking history. She has quit using smokeless tobacco.  Her smokeless tobacco use included chew. She reports previous alcohol use of about 2.0 -  5.0 standard drinks of alcohol per week. She reports current drug use. Drug: Marijuana.  Additional Social History:  Alcohol / Drug Use Pain Medications: See MAR Prescriptions: See MAR Over the Counter: See MAR History of alcohol / drug use?: Yes Longest period of sobriety (when/how long): Unknown Negative Consequences of Use: (Denies) Withdrawal Symptoms: (Denies) Substance #1 Name of Substance 1: Cannabis 1 - Age of First Use: 19 1 - Amount (size/oz): Varies 1 - Frequency: Varies 1 - Duration: Ongoing 1 - Last Use  / Amount: 07/31/19 1 gram  CIWA: CIWA-Ar BP: 123/64 Pulse Rate: 78 COWS:    Allergies:  Allergies  Allergen Reactions  . Sulfa Antibiotics Hives and Rash  . Sulfasalazine Hives  . Other     Fiberglass cast caused rash and hives  . Monosodium Glutamate Nausea And Vomiting, Rash and Other (See Comments)    Headache and dizziness  . Sulfa Drugs Cross Reactors Rash    Home Medications: (Not in a hospital admission)   OB/GYN Status:  Patient's last menstrual period was 07/11/2019 (approximate).  General Assessment Data [facility] of Assessment: [facility] TTS Assessment: In system Is this a Tele or Face-to-Face Assessment?: Face-to-Face Is this an Initial Assessment or a Re-assessment for this encounter?: Initial Assessment Patient Accompanied by:: N/A Language Other than English: No Living Arrangements: Homeless/Shelter What gender do you identify as?: Female Marital status: Single Maiden name: Mayford KnifeWilliams Pregnancy Status: Unknown Living Arrangements: Other (Comment) Can pt return to current living arrangement?: Yes Admission Status: Voluntary Is patient capable of signing voluntary admission?: Yes Referral Source: Self/Family/Friend Insurance type: None     Crisis Care Plan Living Arrangements: Other (Comment) Legal Guardian: Other:(Self) Name of Psychiatrist: None Name of Therapist: Alona BeneJoyce  Education Status Is patient currently in school?: No Is the patient employed, unemployed or receiving disability?: Unemployed  Risk to self with the past 6 months Suicidal Ideation: Yes-Currently Present Has patient been a risk to self within the past 6 months prior to admission? : Yes Suicidal Intent: Yes-Currently Present Has patient had any suicidal intent within the past 6 months prior to admission? : Yes Is patient at risk for suicide?: Yes Suicidal Plan?: Yes-Currently Present Has patient had any suicidal plan within the past 6 months prior to admission? : Yes Specify  Current Suicidal Plan: Cut wrists Access to Means: Yes Specify Access to Suicidal Means: Access to sharps What has been your use of drugs/alcohol within the last 12 months?: Hx of Cannabis use currently denies Previous Attempts/Gestures: Yes How many times?: 3 Other Self Harm Risks: (Homeless) Triggers for Past Attempts: Family contact Intentional Self Injurious Behavior: Cutting Comment - Self Injurious Behavior: Past hx Family Suicide History: No Recent stressful life event(s): Other (Comment)(Homeless) Persecutory voices/beliefs?: No Depression: Yes Depression Symptoms: Feeling worthless/self pity Substance abuse history and/or treatment for substance abuse?: No Suicide prevention information given to non-admitted patients: Not applicable  Risk to Others within the past 6 months Homicidal Ideation: No Does patient have any lifetime risk of violence toward others beyond the six months prior to admission? : No Thoughts of Harm to Others: No Current Homicidal Intent: No Current Homicidal Plan: No Access to Homicidal Means: No Identified Victim: NA History of harm to others?: No Assessment of Violence: None Noted Violent Behavior Description: NA Does patient have access to weapons?: No Criminal Charges Pending?: No Does patient have a court date: No Is patient on probation?: No  Psychosis Hallucinations: None noted Delusions: None noted  Mental Status Report Appearance/Hygiene:  In scrubs Eye Contact: Fair Motor Activity: Freedom of movement Speech: Logical/coherent Level of Consciousness: Quiet/awake Mood: Depressed Affect: Appropriate to circumstance Anxiety Level: Minimal Thought Processes: Coherent, Relevant Judgement: Partial Orientation: Person, Place, Time Obsessive Compulsive Thoughts/Behaviors: None  Cognitive Functioning Concentration: Normal Memory: Recent Intact, Remote Intact Is patient IDD: No Insight: Fair Impulse Control: Fair Appetite:  Poor Have you had any weight changes? : No Change Sleep: No Change Total Hours of Sleep: 7 Vegetative Symptoms: None  ADLScreening [facility] Assessment Services) Patient's cognitive ability adequate to safely complete daily activities?: Yes Patient able to express need for assistance with ADLs?: Yes Independently performs ADLs?: Yes (appropriate for developmental age)  Prior Inpatient Therapy Prior Inpatient Therapy: Yes Prior Therapy Dates: (Multiple) Prior Therapy Facilty/Provider(s): [facility] Reason for Treatment: MH issues  Prior Outpatient Therapy Prior Outpatient Therapy: Yes Prior Therapy Dates: 2 months in 2020 Prior Therapy Facilty/Provider(s): [facility] Reason for Treatment: Med mang Does patient have an ACCT team?: No Does patient have Intensive In-House Services?  : No Does patient have [facility] services? : No Does patient have P4CC services?: No  ADL Screening (condition at time of admission) Patient's cognitive ability adequate to safely complete daily activities?: Yes Is the patient deaf or have difficulty hearing?: No Does the patient have difficulty seeing, even when wearing glasses/contacts?: No Does the patient have difficulty concentrating, remembering, or making decisions?: No Patient able to express need for assistance with ADLs?: Yes Does the patient have difficulty dressing or bathing?: No Independently performs ADLs?: Yes (appropriate for developmental age) Does the patient have difficulty walking or climbing stairs?: No Weakness of Legs: None Weakness of Arms/Hands: None  Home Assistive Devices/Equipment Home Assistive Devices/Equipment: None  Therapy Consults (therapy consults require a physician order) PT Evaluation Needed: No OT Evalulation Needed: No SLP Evaluation Needed: No Abuse/Neglect Assessment (Assessment to be complete while patient is alone) Physical Abuse: Denies Verbal Abuse: Denies Sexual Abuse: Denies Exploitation of patient/patient's  resources: Denies Self-Neglect: Denies Values / Beliefs Cultural Requests During Hospitalization: None Spiritual Requests During Hospitalization: None Consults Spiritual Care Consult Needed: No Social Work Consult Needed: No Merchant navy officer (For Healthcare) Does Patient Have a Medical Advance Directive?: No Would patient like information on creating a medical advance directive?: No - Patient declined          Disposition: Case was staffed with Rankin NP who recommended patient be observed and monitored.  Disposition Initial Assessment Completed for this Encounter: Yes  On Site Evaluation by:   Reviewed with Physician:    Alfredia Ferguson 08/01/2019 6:02 PM

## 2019-08-01 NOTE — ED Notes (Signed)
Pt states she feels like she is going to pass out.  pts BP is 147/88 with a pulse of 80.
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
## 2019-08-02 ENCOUNTER — Inpatient Hospital Stay (HOSPITAL_COMMUNITY)
Admission: AD | Admit: 2019-08-02 | Discharge: 2019-08-05 | DRG: 885 | Disposition: A | Discharge status: Home / Self Care | Payer: No Typology Code available for payment source | Source: Intra-hospital | Attending: Psychiatry | Admitting: Psychiatry

## 2019-08-02 DIAGNOSIS — F603 Borderline personality disorder: Secondary | ICD-10-CM | POA: Diagnosis not present

## 2019-08-02 DIAGNOSIS — F17210 Nicotine dependence, cigarettes, uncomplicated: Secondary | ICD-10-CM | POA: Diagnosis present

## 2019-08-02 DIAGNOSIS — G4700 Insomnia, unspecified: Secondary | ICD-10-CM | POA: Diagnosis present

## 2019-08-02 DIAGNOSIS — F515 Nightmare disorder: Secondary | ICD-10-CM | POA: Diagnosis present

## 2019-08-02 DIAGNOSIS — Z590 Homelessness: Secondary | ICD-10-CM | POA: Diagnosis not present

## 2019-08-02 DIAGNOSIS — F909 Attention-deficit hyperactivity disorder, unspecified type: Secondary | ICD-10-CM | POA: Diagnosis present

## 2019-08-02 DIAGNOSIS — Z560 Unemployment, unspecified: Secondary | ICD-10-CM

## 2019-08-02 DIAGNOSIS — Z91048 Other nonmedicinal substance allergy status: Secondary | ICD-10-CM

## 2019-08-02 DIAGNOSIS — Z9114 Patient's other noncompliance with medication regimen: Secondary | ICD-10-CM | POA: Diagnosis not present

## 2019-08-02 DIAGNOSIS — Z888 Allergy status to other drugs, medicaments and biological substances status: Secondary | ICD-10-CM | POA: Diagnosis not present

## 2019-08-02 DIAGNOSIS — Z91410 Personal history of adult physical and sexual abuse: Secondary | ICD-10-CM | POA: Diagnosis not present

## 2019-08-02 DIAGNOSIS — A64 Unspecified sexually transmitted disease: Secondary | ICD-10-CM | POA: Diagnosis present

## 2019-08-02 DIAGNOSIS — Z882 Allergy status to sulfonamides status: Secondary | ICD-10-CM | POA: Diagnosis not present

## 2019-08-02 DIAGNOSIS — F3181 Bipolar II disorder: Principal | ICD-10-CM

## 2019-08-02 DIAGNOSIS — F333 Major depressive disorder, recurrent, severe with psychotic symptoms: Secondary | ICD-10-CM | POA: Diagnosis present

## 2019-08-02 DIAGNOSIS — F323 Major depressive disorder, single episode, severe with psychotic features: Secondary | ICD-10-CM | POA: Diagnosis present

## 2019-08-02 DIAGNOSIS — R45851 Suicidal ideations: Secondary | ICD-10-CM | POA: Diagnosis present

## 2019-08-02 DIAGNOSIS — Z818 Family history of other mental and behavioral disorders: Secondary | ICD-10-CM | POA: Diagnosis not present

## 2019-08-02 DIAGNOSIS — F322 Major depressive disorder, single episode, severe without psychotic features: Secondary | ICD-10-CM | POA: Diagnosis present

## 2019-08-02 DIAGNOSIS — Z915 Personal history of self-harm: Secondary | ICD-10-CM

## 2019-08-02 DIAGNOSIS — F4310 Post-traumatic stress disorder, unspecified: Secondary | ICD-10-CM | POA: Diagnosis not present

## 2019-08-02 LAB — RPR: RPR Ser Ql: NONREACTIVE

## 2019-08-02 LAB — HIV ANTIBODY (ROUTINE TESTING W REFLEX): HIV Screen 4th Generation wRfx: NONREACTIVE

## 2019-08-02 MED ORDER — CLONAZEPAM 0.5 MG PO TABS
0.5000 mg | ORAL_TABLET | Freq: Every day | ORAL | Status: DC
  Administered 2019-08-02: 15:00:00 0.5 mg via ORAL
  Filled 2019-08-02: qty 1

## 2019-08-02 MED ORDER — QUETIAPINE FUMARATE 400 MG PO TABS
400.0000 mg | ORAL_TABLET | Freq: Every day | ORAL | Status: DC
  Administered 2019-08-02: 400 mg via ORAL
  Filled 2019-08-02: qty 2
  Filled 2019-08-02 (×2): qty 1

## 2019-08-02 MED ORDER — LAMOTRIGINE 25 MG PO TABS
50.0000 mg | ORAL_TABLET | Freq: Every day | ORAL | Status: DC
  Administered 2019-08-02 – 2019-08-04 (×3): 50 mg via ORAL
  Filled 2019-08-02 (×6): qty 2

## 2019-08-02 MED ORDER — DOXYCYCLINE HYCLATE 100 MG PO TABS
100.0000 mg | ORAL_TABLET | Freq: Two times a day (BID) | ORAL | Status: DC
  Administered 2019-08-02 – 2019-08-05 (×6): 100 mg via ORAL
  Filled 2019-08-02 (×9): qty 1

## 2019-08-02 MED ORDER — IBUPROFEN 200 MG PO TABS
600.0000 mg | ORAL_TABLET | Freq: Three times a day (TID) | ORAL | Status: DC | PRN
  Administered 2019-08-02: 600 mg via ORAL
  Filled 2019-08-02: qty 3

## 2019-08-02 MED ORDER — HYDROXYZINE HCL 50 MG PO TABS
50.0000 mg | ORAL_TABLET | Freq: Once | ORAL | Status: AC
  Administered 2019-08-02: 50 mg via ORAL
  Filled 2019-08-02 (×2): qty 1

## 2019-08-02 MED ORDER — ONDANSETRON HCL 4 MG PO TABS
4.0000 mg | ORAL_TABLET | Freq: Three times a day (TID) | ORAL | Status: DC | PRN
  Administered 2019-08-03: 12:00:00 4 mg via ORAL
  Filled 2019-08-02: qty 1

## 2019-08-02 MED ORDER — QUETIAPINE FUMARATE 300 MG PO TABS: 400.0000 mg | ORAL_TABLET | Freq: Every day | ORAL | Status: DC

## 2019-08-02 MED ORDER — LAMOTRIGINE 25 MG PO TABS: 50.0000 mg | ORAL_TABLET | Freq: Every day | ORAL | Status: DC

## 2019-08-02 MED ORDER — FLUOXETINE HCL 20 MG PO CAPS
40.0000 mg | ORAL_CAPSULE | Freq: Every day | ORAL | Status: DC
  Administered 2019-08-03 – 2019-08-05 (×3): 40 mg via ORAL
  Filled 2019-08-02 (×6): qty 2

## 2019-08-02 MED ORDER — FLUOXETINE HCL 20 MG PO CAPS
40.0000 mg | ORAL_CAPSULE | Freq: Every day | ORAL | Status: DC
  Administered 2019-08-02: 15:00:00 40 mg via ORAL
  Filled 2019-08-02: qty 2

## 2019-08-02 MED ORDER — CLONAZEPAM 0.5 MG PO TABS
0.5000 mg | ORAL_TABLET | Freq: Every day | ORAL | Status: DC
  Administered 2019-08-03: 09:00:00 0.5 mg via ORAL
  Filled 2019-08-02: qty 1

## 2019-08-02 MED ORDER — METRONIDAZOLE 500 MG PO TABS
500.0000 mg | ORAL_TABLET | Freq: Two times a day (BID) | ORAL | Status: DC
  Administered 2019-08-02 – 2019-08-05 (×6): 500 mg via ORAL
  Filled 2019-08-02 (×5): qty 1
  Filled 2019-08-02: qty 2
  Filled 2019-08-02 (×3): qty 1

## 2019-08-02 MED ORDER — ALUM & MAG HYDROXIDE-SIMETH 200-200-20 MG/5ML PO SUSP: 30.0000 mL | Freq: Four times a day (QID) | ORAL | Status: DC | PRN

## 2019-08-02 NOTE — ED Provider Notes (Signed)
Pt reportedly fell while in bathroom. I briefly assessed her. WHen I went back she was coming out of the bathroom. She was walking with her arms swinging normally. Wasn't protecting wrist and didn't seem uncomfortable. R wrist/hand grossly normal in appearance. No swelling/deformity. Minimal tenderness in area of distal ulna. NVI. Able to actively range and wrist/hand. I highly doubt fx. Imaging deferred. PRN NSAIDs.    Virgel Manifold, MD 08/02/19 [phone]

## 2019-08-02 NOTE — Discharge Summary (Signed)
  Patient to be transferred to [facility] for inpatient psychiatric treatment 

## 2019-08-02 NOTE — ED Notes (Signed)
Attempted again all three numbers given to me and once to unit where I was transferred to another phone with no answer.
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
## 2019-08-02 NOTE — BH Assessment (Signed)
[facility] Assessment Progress Note   Dr. Toy Care recommends psychiatric Inpatient admission when medically cleared.
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
## 2019-08-02 NOTE — ED Notes (Signed)
Attempted to call [facility] charge as directed for report with no answer.

## 2019-08-02 NOTE — ED Notes (Signed)
Pt c/o nausea x 2 weeks.  I offered her crackers in which she denied and ginger ale which she agreed to try.  She is not eating her meals and feels she needs to go to main ER.  I told patient that I did not feel that was necessary and the we would monitor her closely.

## 2019-08-02 NOTE — Progress Notes (Signed)
This patient continues to meet inpatient criteria. CSW faxed information to the following facilities:   [facility]  [facility] at capacity.  Stephanie Acre, LCSW-A Clinical Social Worker

## 2019-08-02 NOTE — Consult Note (Signed)
Telepsych Consultation   This service was provided via telemedicine using a 2-way, interactive audio and video technology.   Names of all persons participating in this telemedicine service and their role in this encounter. Name: Dr. Toy Care Role: Psychiatrist  Name: Misty Jimenez    Role: Patient  Name: Earleen Newport, NP Role: Co-provider       Reason for Consult:  Suicidal ideations Referring Physician:  Suella Broad Location of Patient: [facility] Location of Provider: [facility]  Patient Identification: TASHANDA FUHRER MRN:  [id number] Principal Diagnosis: Severe bipolar I disorder, most recent episode depressed ([place]) Diagnosis:  Principal Problem:   Severe bipolar I disorder, most recent episode depressed ([place]) Active Problems:   PTSD (post-traumatic stress disorder)   Total Time spent with patient: 45 minutes  Subjective:   Misty Jimenez is a 23 y.o. female patient now in the ED at [facility] after presenting with suicidal ideations.   HPI:  Pt has a long standing psychiatric hx of non-suicidal self injurious behaviors, mood issues and PTSD. She has been hospitalized multiple times and has been tried on numerous psychotropic medications. Her UDS is positive for cannabis. Upon her evaluation today, pt stated that she feels helpless and hopeless. She is thinking about walking in front of a moving car. She has no place to live currently and is homeless. She also reported running out of her medications 4 days ago and having no means of transportation. She is followed by [facility]. Pt reported that she feels she has no reason to live for and if we do not help her then she will go ahead with the plan of ending her life. She endorses mood swings and lability. She feels irritated and agitated easily. She endorsed decreased need for sleep with impulsivity.  She also endorses anhedonia, low energy levels, poor sleep and poor concentration. She  has attempted to end her life by various means in the past including overdosing and cutting her wrists. She denied any hallucinations at present but has had auditory hallucinations in the past. She also endorses nightmares and hypervigilance.   She was taking regimen of Prozac 40 mg daily, Clonazepam 0.5 mg daily, Lamictal 200 mg HS, Seroquel 400 mg HS 4 days ago until she ran out. She wants to be back on the same regimen and wants to be admitted for in-patient stabilization.  Past Psychiatric History: Bipolar disorder, PTSD, has had numerous psychiatric hospitalizations   Risk to Self: Suicidal Ideation: Yes-Currently Present Suicidal Intent: Yes-Currently Present Is patient at risk for suicide?: Yes Suicidal Plan?: Yes-Currently Present Specify Current Suicidal Plan: Cut wrists Access to Means: Yes Specify Access to Suicidal Means: Access to sharps What has been your use of drugs/alcohol within the last 12 months?: Hx of Cannabis use currently denies How many times?: 3 Other Self Harm Risks: (Homeless) Triggers for Past Attempts: Family contact Intentional Self Injurious Behavior: Cutting Comment - Self Injurious Behavior: Past hx Risk to Others: Homicidal Ideation: No Thoughts of Harm to Others: No Current Homicidal Intent: No Current Homicidal Plan: No Access to Homicidal Means: No Identified Victim: NA History of harm to others?: No Assessment of Violence: None Noted Violent Behavior Description: NA Does patient have access to weapons?: No Criminal Charges Pending?: No Does patient have a court date: No Prior Inpatient Therapy: Prior Inpatient Therapy: Yes Prior Therapy Dates: (Multiple) Prior Therapy Facilty/Provider(s): [facility] Reason for Treatment: MH issues Prior Outpatient Therapy: Prior Outpatient Therapy: Yes Prior  Therapy Dates: 2 months in 2020 Prior Therapy Facilty/Provider(s): [facility] Reason for Treatment: Med mang Does patient have an ACCT team?: No Does  patient have Intensive In-House Services?  : No Does patient have [facility] services? : No Does patient have P4CC services?: No  Past Medical History:  Past Medical History:  Diagnosis Date  . Abdominal pain, chronic, right lower quadrant 08/03/2013  . Abdominal pain, recurrent    With Headache  . Acute pyelonephritis 05/07/2016   kidney surgery at 36 months of age  . ADHD (attention deficit hyperactivity disorder)   . Allergy   . Anxiety   . Depression   . Endometriosis   . Family history of adverse reaction to anesthesia    "grandmother gets PONV"  . GE reflux 12/16/2011  . Hyperlipidemia   . Kidney disease 05/07/2016  . PCOS (polycystic ovarian syndrome)   . Preventative health care 11/04/2016  . Reflux, vesicoureteral   . Wrist pain, left 11/04/2016    Past Surgical History:  Procedure Laterality Date  . KIDNEY SURGERY     As a small child  . TYMPANOSTOMY TUBE PLACEMENT    . URETER SURGERY    . WRIST SURGERY Left 05/2016   10 pins and 2 plates   Family History:  Family History  Problem Relation Age of Onset  . Kidney disease Mother   . Migraines Maternal Aunt   . Diabetes Maternal Grandfather   . Hyperlipidemia Other   . Hypertension Other   . Depression Other   . Alcohol abuse Father        and drug use, heroin, cocaine, marijuana  . Cancer Paternal Aunt        colon in 40s deceased   Family Psychiatric  History: denied Social History:  Social History   Substance and Sexual Activity  Alcohol Use Not Currently  . Alcohol/week: 2.0 - 5.0 standard drinks  . Types: 2 - 5 Cans of beer per week   Comment: 3-4 x week     Social History   Substance and Sexual Activity  Drug Use Yes  . Types: Marijuana    Social History   Socioeconomic History  . Marital status: Single    Spouse name: Not on file  . Number of children: Not on file  . Years of education: Not on file  . Highest education level: Not on file  Occupational History  . Not on file  Social Needs   . Financial resource strain: Not on file  . Food insecurity    Worry: Not on file    Inability: Not on file  . Transportation needs    Medical: Not on file    Non-medical: Not on file  Tobacco Use  . Smoking status: Current Every Day Smoker    Packs/day: 1.00    Years: 0.50    Pack years: 0.50    Types: Cigarettes    Last attempt to quit: 10/28/2015    Years since quitting: 3.7  . Smokeless tobacco: Former Neurosurgeon    Types: Chew  . Tobacco comment: Pt declines information  Substance and Sexual Activity  . Alcohol use: Not Currently    Alcohol/week: 2.0 - 5.0 standard drinks    Types: 2 - 5 Cans of beer per week    Comment: 3-4 x week  . Drug use: Yes    Types: Marijuana  . Sexual activity: Not Currently    Birth control/protection: Implant  Lifestyle  . Physical activity    Days  per week: Not on file    Minutes per session: Not on file  . Stress: Not on file  Relationships  . Social Musicianconnections    Talks on phone: Not on file    Gets together: Not on file    Attends religious service: Not on file    Active member of club or organization: Not on file    Attends meetings of clubs or organizations: Not on file    Relationship status: Not on file  Other Topics Concern  . Not on file  Social History Narrative   Lives in boyfriend, completing High school   No dietary restrictions has a history of binge eating.. Former smoker, no alcohol or drug use.   Additional Social History:    Allergies:   Allergies  Allergen Reactions  . Sulfa Antibiotics Hives and Rash  . Sulfasalazine Hives  . Other     Fiberglass cast caused rash and hives  . Monosodium Glutamate Nausea And Vomiting, Rash and Other (See Comments)    Headache and dizziness  . Sulfa Drugs Cross Reactors Rash    Labs:  Results for orders placed or performed during the hospital encounter of 08/01/19 (from the past 48 hour(s))  Comprehensive metabolic panel     Status: Abnormal   Collection Time: 08/01/19  12:55 PM  Result Value Ref Range   Sodium 140 135 - 145 mmol/L   Potassium 3.9 3.5 - 5.1 mmol/L   Chloride 107 98 - 111 mmol/L   CO2 25 22 - 32 mmol/L   Glucose, Bld 103 (H) 70 - 99 mg/dL   BUN 19 6 - 20 mg/dL   Creatinine, Ser 4.090.81 0.44 - 1.00 mg/dL   Calcium 9.0 8.9 - 81.110.3 mg/dL   Total Protein 7.3 6.5 - 8.1 g/dL   Albumin 4.0 3.5 - 5.0 g/dL   AST 24 15 - 41 U/L   ALT 14 0 - 44 U/L   Alkaline Phosphatase 85 38 - 126 U/L   Total Bilirubin 0.2 (L) 0.3 - 1.2 mg/dL   GFR calc non Af Amer >60 >60 mL/min   GFR calc Af Amer >60 >60 mL/min   Anion gap 8 5 - 15    Comment: Performed at [facility], [address] [place], [place] [address]  Ethanol     Status: None   Collection Time: 08/01/19 12:55 PM  Result Value Ref Range   Alcohol, Ethyl (B) <10 <10 mg/dL    Comment: (NOTE) Lowest detectable limit for serum alcohol is 10 mg/dL. For medical purposes only. Performed at [facility], [address] [place], [place] [address]   CBC with Diff     Status: Abnormal   Collection Time: 08/01/19 12:55 PM  Result Value Ref Range   WBC 11.0 (H) 4.0 - 10.5 K/uL   RBC 4.47 3.87 - 5.11 MIL/uL   Hemoglobin 13.3 12.0 - 15.0 g/dL   HCT 13.039.9 86.536.0 - 78.446.0 %   MCV 89.3 80.0 - 100.0 fL   MCH 29.8 26.0 - 34.0 pg   MCHC 33.3 30.0 - 36.0 g/dL   RDW 69.612.5 29.511.5 - 28.415.5 %   Platelets 442 (H) 150 - 400 K/uL   nRBC 0.0 0.0 - 0.2 %   Neutrophils Relative % 66 %   Neutro Abs 7.3 1.7 - 7.7 K/uL   Lymphocytes Relative 26 %   Lymphs Abs 2.9 0.7 - 4.0 K/uL   Monocytes Relative 6 %   Monocytes Absolute  0.7 0.1 - 1.0 K/uL   Eosinophils Relative 1 %   Eosinophils Absolute 0.1 0.0 - 0.5 K/uL   Basophils Relative 1 %   Basophils Absolute 0.1 0.0 - 0.1 K/uL   Immature Granulocytes 0 %   Abs Immature Granulocytes 0.02 0.00 - 0.07 K/uL    Comment: Performed at [facility], [address], [place], [place] [address]  Salicylate level     Status: None    Collection Time: 08/01/19 12:55 PM  Result Value Ref Range   Salicylate Lvl <7.0 2.8 - 30.0 mg/dL    Comment: Performed at [facility], [address] [place], [place] [address]  Acetaminophen level     Status: Abnormal   Collection Time: 08/01/19 12:55 PM  Result Value Ref Range   Acetaminophen (Tylenol), Serum <10 (L) 10 - 30 ug/mL    Comment: (NOTE) Therapeutic concentrations vary significantly. A range of 10-30 ug/mL  may be an effective concentration for many patients. However, some  are best treated at concentrations outside of this range. Acetaminophen concentrations >150 ug/mL at 4 hours after ingestion  and >50 ug/mL at 12 hours after ingestion are often associated with  toxic reactions. Performed at [facility], [address], [place], [place] [address]   Urinalysis, Routine w reflex microscopic     Status: Abnormal   Collection Time: 08/01/19 12:55 PM  Result Value Ref Range   Color, Urine RED (A) YELLOW    Comment: BIOCHEMICALS MAY BE AFFECTED BY COLOR   APPearance TURBID (A) CLEAR   Specific Gravity, Urine 1.015 1.005 - 1.030   pH 8.5 (H) 5.0 - 8.0   Glucose, UA NEGATIVE NEGATIVE mg/dL   Hgb urine dipstick LARGE (A) NEGATIVE   Bilirubin Urine NEGATIVE NEGATIVE   Ketones, ur 15 (A) NEGATIVE mg/dL   Protein, ur >196 (A) NEGATIVE mg/dL   Nitrite POSITIVE (A) NEGATIVE   Leukocytes,Ua MODERATE (A) NEGATIVE    Comment: Performed at [facility], [address] [place], [place] [address]  RPR     Status: None   Collection Time: 08/01/19 12:55 PM  Result Value Ref Range   RPR Ser Ql NON REACTIVE NON REACTIVE    Comment: Performed at [facility], [address], [place], [place] [address]  Wet prep, genital     Status: None   Collection Time: 08/01/19 12:55 PM   Specimen: PATH Cytology Cervicovaginal Ancillary Only  Result Value Ref Range   Yeast Wet Prep HPF POC NONE SEEN NONE SEEN   Trich, Wet  Prep NONE SEEN NONE SEEN   Clue Cells Wet Prep HPF POC NONE SEEN NONE SEEN   WBC, Wet Prep HPF POC NONE SEEN NONE SEEN   Sperm NONE SEEN     Comment: Performed at [facility], [address], [place], [place] [address]  Urinalysis, Microscopic (reflex)     Status: Abnormal   Collection Time: 08/01/19 12:55 PM  Result Value Ref Range   RBC / HPF >50 0 - 5 RBC/hpf   WBC, UA 0-5 0 - 5 WBC/hpf   Bacteria, UA RARE (A) NONE SEEN   Squamous Epithelial / LPF 0-5 0 - 5   Triple Phosphate Crystal PRESENT     Comment: Performed at [facility], [address], [place], [place] [address]  I-Stat beta hCG blood, ED     Status: None   Collection Time: 08/01/19 12:59 PM  Result Value  Ref Range   I-stat hCG, quantitative <5.0 <5 mIU/mL   Comment 3            Comment:   GEST. AGE      CONC.  (mIU/mL)   <=1 WEEK        5 - 50     2 WEEKS       50 - 500     3 WEEKS       100 - 10,000     4 WEEKS     1,000 - 30,000        FEMALE AND NON-PREGNANT FEMALE:     LESS THAN 5 mIU/mL   Urine rapid drug screen (hosp performed)     Status: Abnormal   Collection Time: 08/01/19  1:00 PM  Result Value Ref Range   Opiates NONE DETECTED NONE DETECTED   Cocaine NONE DETECTED NONE DETECTED   Benzodiazepines NONE DETECTED NONE DETECTED   Amphetamines NONE DETECTED NONE DETECTED   Tetrahydrocannabinol POSITIVE (A) NONE DETECTED   Barbiturates NONE DETECTED NONE DETECTED    Comment: (NOTE) DRUG SCREEN FOR MEDICAL PURPOSES ONLY.  IF CONFIRMATION IS NEEDED FOR ANY PURPOSE, NOTIFY LAB WITHIN 5 DAYS. LOWEST DETECTABLE LIMITS FOR URINE DRUG SCREEN Drug Class                     Cutoff (ng/mL) Amphetamine and metabolites    1000 Barbiturate and metabolites    200 Benzodiazepine                 200 Tricyclics and metabolites     300 Opiates and metabolites        300 Cocaine and metabolites        300 THC                            50 Performed at [facility], [address] [place], [place] [address]     Medications:  Current Facility-Administered Medications  Medication Dose Route Frequency Provider Last Rate Last Dose  . acetaminophen (TYLENOL) tablet 650 mg  650 mg Oral Q4H PRN Henderly, Britni A, PA-C      . alum & mag hydroxide-simeth (MAALOX/MYLANTA) 200-200-20 MG/5ML suspension 30 mL  30 mL Oral Q6H PRN Henderly, Britni A, PA-C      . clonazePAM (KLONOPIN) tablet 0.5 mg  0.5 mg Oral Daily Zena Amos, MD      . doxycycline (VIBRA-TABS) tablet 100 mg  100 mg Oral Q12H Henderly, Britni A, PA-C   100 mg at 08/02/19 1055  . FLUoxetine (PROZAC) capsule 40 mg  40 mg Oral Daily Zena Amos, MD      . ibuprofen (ADVIL) tablet 600 mg  600 mg Oral Q8H PRN Raeford Razor, MD   600 mg at 08/02/19 1257  . lamoTRIgine (LAMICTAL) tablet 50 mg  50 mg Oral QHS Zena Amos, MD      . metroNIDAZOLE (FLAGYL) tablet 500 mg  500 mg Oral Q12H Henderly, Britni A, PA-C   500 mg at 08/02/19 1055  . ondansetron (ZOFRAN) tablet 4 mg  4 mg Oral Q8H PRN Henderly, Britni A, PA-C      . QUEtiapine (SEROQUEL) tablet 400 mg  400 mg Oral QHS Zena Amos, MD       Current Outpatient Medications  Medication Sig Dispense Refill  . clonazePAM (KLONOPIN) 0.5 MG tablet Take 0.5 mg by mouth daily.    Marland Kitchen  diclofenac (VOLTAREN) 75 MG EC tablet TAKE 1 TABLET(75 MG) BY MOUTH TWICE DAILY WITH A MEAL (Patient taking differently: Take 75 mg by mouth 2 (two) times daily. ) 60 tablet 2  . FLUoxetine (PROZAC) 40 MG capsule Take 40 mg by mouth daily.    . hydrOXYzine (VISTARIL) 25 MG capsule Take 50 mg by mouth 2 (two) times daily.   0  . lamoTRIgine (LAMICTAL) 200 MG tablet Take 200 mg by mouth daily.    Marland Kitchen LORazepam (ATIVAN) 0.5 MG tablet Take 0.5 mg by mouth at bedtime. 0.5 to 1 at bedtime    . mirtazapine (REMERON) 15 MG tablet TAKE 1 TABLET(15 MG) BY MOUTH AT BEDTIME (Patient taking differently: Take 15 mg by mouth at bedtime. ) 30 tablet 0  . naproxen sodium (ALEVE)  220 MG tablet Take 440-660 mg by mouth 2 (two) times daily as needed (headache).    . nicotine (NICODERM CQ - DOSED IN MG/24 HOURS) 21 mg/24hr patch Place 21 mg onto the skin daily.    . norgestimate-ethinyl estradiol (ORTHO-CYCLEN,SPRINTEC,PREVIFEM) 0.25-35 MG-MCG tablet Take 1 tablet by mouth daily. 1 Package 11  . prazosin (MINIPRESS) 1 MG capsule Take 2 capsules (2 mg total) by mouth at bedtime. 60 capsule 0  . QUEtiapine (SEROQUEL) 400 MG tablet Take 400 mg by mouth at bedtime.    Marland Kitchen doxycycline (VIBRAMYCIN) 100 MG capsule Take 1 capsule (100 mg total) by mouth 2 (two) times daily. 12 capsule 0  . FLUoxetine (PROZAC) 20 MG capsule Take 1 capsule (20 mg total) by mouth daily. For depression (Patient not taking: Reported on 08/01/2019) 30 capsule 3  . hydrOXYzine (ATARAX/VISTARIL) 25 MG tablet Take 1 tablet (25 mg total) by mouth 3 (three) times daily as needed for anxiety. (Patient not taking: Reported on 12/18/2018) 60 tablet 0    Musculoskeletal: Strength & Muscle Tone: within normal limits Gait & Station: normal Patient leans: N/A  Psychiatric Specialty Exam: Physical Exam  ROS  Blood pressure 120/68, pulse 86, temperature 98.6 F (37 C), temperature source Oral, resp. rate 17, height  (1.702 m), weight 117 kg, last menstrual period 07/11/2019, SpO2 100 %.Body mass index is 40.4 kg/m.  General Appearance: Disheveled  Eye Contact:  Good  Speech:  Clear and Coherent  Volume:  Normal  Mood:  Depressed  Affect:  Mood congruent  Thought Process:  Goal Directed, Description of associations: intact  Orientation:  Full (Time, Place, and Person)  Thought Content:  Logical  Suicidal Thoughts:  Yes.  with intent/plan  Homicidal Thoughts:  No  Memory:  Immediate;   Good Recent;   Good  Judgement:  Poor  Insight:  Lacking  Psychomotor Activity:  Normal  Concentration:  Concentration: Good and Attention Span: Good  Recall:  Good  Fund of Knowledge:  Good  Language:  Good   Akathisia:  Not noted  Handed:  Right  AIMS (if indicated):     Assets:  Communication Skills Physical Health  ADL's:  Intact  Cognition:  WNL  Sleep:   poor     Treatment Plan Summary: Assessment and Plan: 23 year old female with hx of Bipolar disorder and PTSD and suspected borderline personality disorder now seen in the ED for suicidal ideations and depressed mood. She has had numerous psychiatric hospitalizations in the past. She is still endorsing suicidal ideations with a plan to walk in to moving cars. - Will restart her back on Prozac 40 mg daily, Seroquel 400 mg HS, Clonazepam 0.5 daily. -  Pt wants to be back on Lamictal. Pt was explained that Lamictal will have to be titrated back up slowly. Will restart her back on 50 mg HS and then titrate gradually. - Based on her evaluation, pt meets criteria for in-patient psychiatric admission.  Disposition: Recommend psychiatric Inpatient admission when medically cleared.   Zena Amos, MD 08/02/2019 2:34 PM

## 2019-08-02 NOTE — Progress Notes (Signed)
Patient called Disposition CSW stating she was just assessed and she "needs to come inpatient" at [facility] CSW shared that the assessment team would determine if she meets inpatient criteria, and if so, [facility] would have to appropriate bed availability for her to be admitted.  Patient reports she called two days ago and told there were available beds. She reports she will call CSW back for updates.   Stephanie Acre, LCSW-A Clinical Social Worker

## 2019-08-03 ENCOUNTER — Encounter (HOSPITAL_COMMUNITY): Payer: Self-pay | Admitting: *Deleted

## 2019-08-03 DIAGNOSIS — F3181 Bipolar II disorder: Secondary | ICD-10-CM

## 2019-08-03 DIAGNOSIS — F4310 Post-traumatic stress disorder, unspecified: Secondary | ICD-10-CM

## 2019-08-03 DIAGNOSIS — F603 Borderline personality disorder: Secondary | ICD-10-CM

## 2019-08-03 LAB — GC/CHLAMYDIA PROBE AMP (~~LOC~~) NOT AT ARMC
Chlamydia: NEGATIVE
Neisseria Gonorrhea: NEGATIVE

## 2019-08-03 MED ORDER — HYDROXYZINE HCL 25 MG PO TABS
25.0000 mg | ORAL_TABLET | Freq: Three times a day (TID) | ORAL | Status: DC | PRN
  Administered 2019-08-03: 21:00:00 25 mg via ORAL
  Filled 2019-08-03: qty 1

## 2019-08-03 MED ORDER — MIRTAZAPINE 15 MG PO TABS
15.0000 mg | ORAL_TABLET | Freq: Every day | ORAL | Status: DC
  Administered 2019-08-03 – 2019-08-04 (×2): 15 mg via ORAL
  Filled 2019-08-03 (×4): qty 1

## 2019-08-03 MED ORDER — IBUPROFEN 600 MG PO TABS
600.0000 mg | ORAL_TABLET | Freq: Four times a day (QID) | ORAL | Status: DC | PRN
  Administered 2019-08-03 – 2019-08-05 (×3): 600 mg via ORAL
  Filled 2019-08-03 (×3): qty 1

## 2019-08-03 MED ORDER — DOCUSATE SODIUM 100 MG PO CAPS
100.0000 mg | ORAL_CAPSULE | Freq: Every day | ORAL | Status: DC | PRN
  Administered 2019-08-03: 100 mg via ORAL
  Filled 2019-08-03: qty 1
  Filled 2019-08-03: qty 3

## 2019-08-03 MED ORDER — NICOTINE 14 MG/24HR TD PT24
14.0000 mg | MEDICATED_PATCH | Freq: Every day | TRANSDERMAL | Status: DC
  Administered 2019-08-04 – 2019-08-05 (×2): 14 mg via TRANSDERMAL
  Filled 2019-08-03 (×4): qty 1

## 2019-08-03 MED ORDER — QUETIAPINE FUMARATE 300 MG PO TABS
600.0000 mg | ORAL_TABLET | Freq: Every day | ORAL | Status: DC
  Administered 2019-08-03 – 2019-08-04 (×2): 600 mg via ORAL
  Filled 2019-08-03 (×4): qty 2

## 2019-08-03 MED ORDER — PRAZOSIN HCL 2 MG PO CAPS
2.0000 mg | ORAL_CAPSULE | Freq: Every day | ORAL | Status: DC
  Administered 2019-08-03 – 2019-08-04 (×2): 2 mg via ORAL
  Filled 2019-08-03 (×3): qty 1
  Filled 2019-08-03: qty 2

## 2019-08-03 MED ORDER — CLONAZEPAM 1 MG PO TABS
1.0000 mg | ORAL_TABLET | Freq: Every day | ORAL | Status: DC
  Administered 2019-08-04 – 2019-08-05 (×2): 1 mg via ORAL
  Filled 2019-08-03: qty 2
  Filled 2019-08-03: qty 1
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
## 2019-08-03 NOTE — Progress Notes (Signed)
Per patient request, patient was provided printed shelter resources and the contact information for [facility]. Patient was advised to call [facility] and leave a message with her name and code.  Stephanie Acre, LCSW-A Clinical Social Worker

## 2019-08-03 NOTE — Progress Notes (Addendum)
Pt seen holding another patient's hand and sharing a blanket with two other patients. Pt was asked to refrain from touching other patients and all patients given their own blanket. Pt were explained to by this writer that even if comforting each other touching is not allowed. Pt was seen again holding the same patients hand and had to be asked again to please refrain from touching because of boundaries and Covid.

## 2019-08-03 NOTE — Progress Notes (Signed)
Recreation Therapy Notes  Date:  10.7.20 Time: 1120 Location: 300 Hall Dayroom  Group Topic: Stress Management  Goal Area(s) Addresses:  Patient will identify positive stress management techniques. Patient will identify benefits of using stress management post d/c.  Behavioral Response:  Engaged  Intervention:  Stress Management  Activity :  Meditation.  LRT introduced the stress management technique of meditation.  LRT played a meditation that focused on embodying the characteristics of mountain and bringing it into meditation and everyday life.  Patients were to follow along as meditation played.  Education:  Stress Management, Discharge Planning.   Education Outcome: Acknowledges Education  Clinical Observations/Feedback:  Pt attended and participated in session.     Victorino Sparrow, LRT/CTRS         Ria Comment, Esra Frankowski A 08/03/2019 11:59 AM

## 2019-08-03 NOTE — Tx Team (Signed)
Interdisciplinary Treatment and Diagnostic Plan Update  08/03/2019 Time of Session: 8:55am [name] MRN: [id number]  Principal Diagnosis: <principal problem not specified>  Secondary Diagnoses: Active Problems:   MDD (major depressive disorder), recurrent, severe, with psychosis ([name])   Current Medications:  Current Facility-Administered Medications  Medication Dose Route Frequency Provider Last Rate Last Dose  . alum & mag hydroxide-simeth (MAALOX/MYLANTA) 200-200-20 MG/5ML suspension 30 mL  30 mL Oral Q6H PRN Rankin, Shuvon B, NP      . clonazePAM (KLONOPIN) tablet 0.5 mg  0.5 mg Oral Daily Rankin, Shuvon B, NP   0.5 mg at 08/03/19 0832  . doxycycline (VIBRA-TABS) tablet 100 mg  100 mg Oral Q12H Rankin, Shuvon B, NP   100 mg at 08/03/19 0918  . FLUoxetine (PROZAC) capsule 40 mg  40 mg Oral Daily Rankin, Shuvon B, NP   40 mg at 08/03/19 0832  . lamoTRIgine (LAMICTAL) tablet 50 mg  50 mg Oral QHS Rankin, Shuvon B, NP   50 mg at 08/02/19 2146  . metroNIDAZOLE (FLAGYL) tablet 500 mg  500 mg Oral Q12H Rankin, Shuvon B, NP   500 mg at 08/03/19 0923  . ondansetron (ZOFRAN) tablet 4 mg  4 mg Oral Q8H PRN Rankin, Shuvon B, NP      . QUEtiapine (SEROQUEL) tablet 400 mg  400 mg Oral QHS Rankin, Shuvon B, NP   400 mg at 08/02/19 2146   PTA Medications: Medications Prior to Admission  Medication Sig Dispense Refill Last Dose  . clonazePAM (KLONOPIN) 0.5 MG tablet Take 0.5 mg by mouth daily.     . diclofenac (VOLTAREN) 75 MG EC tablet TAKE 1 TABLET(75 MG) BY MOUTH TWICE DAILY WITH A MEAL (Patient taking differently: Take 75 mg by mouth 2 (two) times daily. ) 60 tablet 2   . doxycycline (VIBRAMYCIN) 100 MG capsule Take 1 capsule (100 mg total) by mouth 2 (two) times daily. 12 capsule 0   . FLUoxetine (PROZAC) 20 MG capsule Take 1 capsule (20 mg total) by mouth daily. For depression (Patient not taking: Reported on 08/01/2019) 30 capsule 3   . FLUoxetine (PROZAC) 40 MG capsule Take 40 mg by  mouth daily.     . hydrOXYzine (ATARAX/VISTARIL) 25 MG tablet Take 1 tablet (25 mg total) by mouth 3 (three) times daily as needed for anxiety. (Patient not taking: Reported on 12/18/2018) 60 tablet 0   . hydrOXYzine (VISTARIL) 25 MG capsule Take 50 mg by mouth 2 (two) times daily.   0   . lamoTRIgine (LAMICTAL) 200 MG tablet Take 200 mg by mouth daily.     Marland Kitchen LORazepam (ATIVAN) 0.5 MG tablet Take 0.5 mg by mouth at bedtime. 0.5 to 1 at bedtime     . mirtazapine (REMERON) 15 MG tablet TAKE 1 TABLET(15 MG) BY MOUTH AT BEDTIME (Patient taking differently: Take 15 mg by mouth at bedtime. ) 30 tablet 0   . naproxen sodium (ALEVE) 220 MG tablet Take 440-660 mg by mouth 2 (two) times daily as needed (headache).     . nicotine (NICODERM CQ - DOSED IN MG/24 HOURS) 21 mg/24hr patch Place 21 mg onto the skin daily.     . norgestimate-ethinyl estradiol (ORTHO-CYCLEN,SPRINTEC,PREVIFEM) 0.25-35 MG-MCG tablet Take 1 tablet by mouth daily. 1 Package 11   . prazosin (MINIPRESS) 1 MG capsule Take 2 capsules (2 mg total) by mouth at bedtime. 60 capsule 0   . QUEtiapine (SEROQUEL) 400 MG tablet Take 400 mg by mouth at bedtime.  Patient Stressors: Financial difficulties Medication change or noncompliance Occupational concerns Traumatic event  Patient Strengths: Average or above average intelligence Capable of independent living Communication skills General fund of knowledge Motivation for treatment/growth Supportive family/friends  Treatment Modalities: Medication Management, Group therapy, Case management,  1 to 1 session with clinician, Psychoeducation, Recreational therapy.   Physician Treatment Plan for Primary Diagnosis: <principal problem not specified> Long Term Goal(s):     Short Term Goals:    Medication Management: Evaluate patient's response, side effects, and tolerance of medication regimen.  Therapeutic Interventions: 1 to 1 sessions, Unit Group sessions and Medication  administration.  Evaluation of Outcomes: Not Met  Physician Treatment Plan for Secondary Diagnosis: Active Problems:   MDD (major depressive disorder), recurrent, severe, with psychosis ([name])  Long Term Goal(s):     Short Term Goals:       Medication Management: Evaluate patient's response, side effects, and tolerance of medication regimen.  Therapeutic Interventions: 1 to 1 sessions, Unit Group sessions and Medication administration.  Evaluation of Outcomes: Not Met   RN Treatment Plan for Primary Diagnosis: <principal problem not specified> Long Term Goal(s): Knowledge of disease and therapeutic regimen to maintain health will improve  Short Term Goals: Ability to participate in decision making will improve, Ability to verbalize feelings will improve, Ability to disclose and discuss suicidal ideas and Ability to identify and develop effective coping behaviors will improve  Medication Management: RN will administer medications as ordered by provider, will assess and evaluate patient's response and provide education to patient for prescribed medication. RN will report any adverse and/or side effects to prescribing provider.  Therapeutic Interventions: 1 on 1 counseling sessions, Psychoeducation, Medication administration, Evaluate responses to treatment, Monitor vital signs and CBGs as ordered, Perform/monitor CIWA, COWS, AIMS and Fall Risk screenings as ordered, Perform wound care treatments as ordered.  Evaluation of Outcomes: Not Met   LCSW Treatment Plan for Primary Diagnosis: <principal problem not specified> Long Term Goal(s): Safe transition to appropriate next level of care at discharge, Engage patient in therapeutic group addressing interpersonal concerns.  Short Term Goals: Engage patient in aftercare planning with referrals and resources  Therapeutic Interventions: Assess for all discharge needs, 1 to 1 time with Social worker, Explore available resources and support  systems, Assess for adequacy in community support network, Educate family and significant other(s) on suicide prevention, Complete Psychosocial Assessment, Interpersonal group therapy.  Evaluation of Outcomes: Not Met   Progress in Treatment: Attending groups: No. New to unit Participating in groups: No. Taking medication as prescribed: Yes. Toleration medication: Yes. Family/Significant other contact made: No, will contact:  if given consent  Patient understands diagnosis: Yes. Discussing patient identified problems/goals with staff: Yes. Medical problems stabilized or resolved: Yes. Denies suicidal/homicidal ideation: Yes. Issues/concerns per patient self-inventory: No. Other:   New problem(s) identified:  New Short Term/Long Term Goal(s): Medication stabilization, elimination of SI thoughts, and development of a comprehensive mental wellness plan.   Patient Goals:  "Not to kill myself"  Discharge Plan or Barriers: Patient is new admission. CSW will continue to follow and assess for potential discharge planning.   Reason for Continuation of Hospitalization: Anxiety Depression Suicidal ideation  Estimated Length of Stay: 3-5 days  Attendees: Patient: Misty Jimenez  08/03/2019 9:52 AM  Physician: Dr. Mallie Darting, MD 08/03/2019 9:52 AM  Nursing: Sharyn Lull, RN 08/03/2019 9:52 AM  RN Care Manager: 08/03/2019 9:52 AM  Social Worker: Radonna Ricker, LCSW 08/03/2019 9:52 AM  Recreational Therapist:  08/03/2019 9:52 AM  Other: Jillyn Ledger  Binnie Rail, MSW intern 08/03/2019 9:52 AM  Other: Marcie Bal, NP 08/03/2019 9:52 AM  Other: 08/03/2019 9:52 AM    Scribe for Treatment Team: Billey Chang, Student-Social Work 08/03/2019 9:52 AM
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
## 2019-08-04 MED ORDER — HYDROXYZINE HCL 50 MG PO TABS
50.0000 mg | ORAL_TABLET | Freq: Three times a day (TID) | ORAL | Status: DC | PRN
  Administered 2019-08-05: 08:00:00 50 mg via ORAL
  Filled 2019-08-04 (×4): qty 1

## 2019-08-04 NOTE — Progress Notes (Signed)
The patient's positive event for the day is that she had fun with her peers. Her goal for tomorrow is to look for a place to live.
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
## 2019-08-04 NOTE — Progress Notes (Signed)
[facility] MD Progress Note  08/04/2019 11:09 AM Misty Jimenez  MRN:  [id number] Subjective: Patient is a 23 year old female with a past psychiatric history significant for borderline personality disorder, reported bipolar disorder, posttraumatic stress disorder and chronic suicidality who presented to the [facility] emergency department on 08/01/2019 with worsening depression, anxiety and suicidal ideation.  Objective: Patient is seen and examined.  Patient is a 23 year old female with the above-stated past psychiatric history is seen in follow-up.  She is essentially unchanged from yesterday.  She was unaware that we had found housing for her, and that the plan was to be able to get her discharged tomorrow.  She is concerned about getting her cigarettes from her grandmother's house, and would like to transport there to get them and then go to where she will be staying in a hotel.  I asked her to reconsider that.  She has been very dramatic on the hallway as well as in group.  No gross change in her chronic suicidality.  Her blood pressure is elevated this morning at 133/104, pulse is 98.  She is afebrile.  She slept 6.75 hours last night.  Review of her laboratories showed no new laboratories from admission.  It should be noted that her bacterial test for chlamydia, Neisseria and syphilis were all negative.  Her wet prep was negative, trichomonas was negative, clue cells were negative, and no white blood cells seen on her wet prep.  Her HIV screen was negative.  Principal Problem: <principal problem not specified> Diagnosis: Active Problems:   Posttraumatic stress disorder   MDD (major depressive disorder), recurrent, severe, with psychosis ([place])   Borderline personality disorder ([place])   Bipolar 2 disorder ([place])  Total Time spent with patient: 20 minutes  Past Psychiatric History: See admission H&P  Past Medical History:  Past Medical History:  Diagnosis Date  . Abdominal pain,  chronic, right lower quadrant 08/03/2013  . Abdominal pain, recurrent    With Headache  . Acute pyelonephritis 05/07/2016   kidney surgery at 11 months of age  . ADHD (attention deficit hyperactivity disorder)   . Allergy   . Anxiety   . Depression   . Endometriosis   . Family history of adverse reaction to anesthesia    "grandmother gets PONV"  . GE reflux 12/16/2011  . Hyperlipidemia   . Kidney disease 05/07/2016  . PCOS (polycystic ovarian syndrome)   . Preventative health care 11/04/2016  . Reflux, vesicoureteral   . Wrist pain, left 11/04/2016    Past Surgical History:  Procedure Laterality Date  . KIDNEY SURGERY     As a small child  . TYMPANOSTOMY TUBE PLACEMENT    . URETER SURGERY    . WRIST SURGERY Left 05/2016   10 pins and 2 plates   Family History:  Family History  Problem Relation Age of Onset  . Kidney disease Mother   . Migraines Maternal Aunt   . Diabetes Maternal Grandfather   . Hyperlipidemia Other   . Hypertension Other   . Depression Other   . Alcohol abuse Father        and drug use, heroin, cocaine, marijuana  . Cancer Paternal Aunt        colon in 28s deceased   Family Psychiatric  History: See admission H&P Social History:  Social History   Substance and Sexual Activity  Alcohol Use Not Currently  . Alcohol/week: 2.0 - 5.0 standard drinks  . Types: 2 - 5 Cans of  beer per week   Comment: 3-4 x week     Social History   Substance and Sexual Activity  Drug Use Yes  . Types: Marijuana    Social History   Socioeconomic History  . Marital status: Single    Spouse name: Not on file  . Number of children: Not on file  . Years of education: Not on file  . Highest education level: Not on file  Occupational History  . Not on file  Social Needs  . Financial resource strain: Not on file  . Food insecurity    Worry: Not on file    Inability: Not on file  . Transportation needs    Medical: Not on file    Non-medical: Not on file  Tobacco  Use  . Smoking status: Current Every Day Smoker    Packs/day: 1.00    Years: 0.50    Pack years: 0.50    Types: Cigarettes    Last attempt to quit: 10/28/2015    Years since quitting: 3.7  . Smokeless tobacco: Former Neurosurgeon    Types: Chew  . Tobacco comment: Pt declines information  Substance and Sexual Activity  . Alcohol use: Not Currently    Alcohol/week: 2.0 - 5.0 standard drinks    Types: 2 - 5 Cans of beer per week    Comment: 3-4 x week  . Drug use: Yes    Types: Marijuana  . Sexual activity: Not Currently    Birth control/protection: Implant  Lifestyle  . Physical activity    Days per week: Not on file    Minutes per session: Not on file  . Stress: Not on file  Relationships  . Social Musician on phone: Not on file    Gets together: Not on file    Attends religious service: Not on file    Active member of club or organization: Not on file    Attends meetings of clubs or organizations: Not on file    Relationship status: Not on file  Other Topics Concern  . Not on file  Social History Narrative   Lives in boyfriend, completing High school   No dietary restrictions has a history of binge eating.. Former smoker, no alcohol or drug use.   Additional Social History:                         Sleep: Good  Appetite:  Good  Current Medications: Current Facility-Administered Medications  Medication Dose Route Frequency Provider Last Rate Last Dose  . alum & mag hydroxide-simeth (MAALOX/MYLANTA) 200-200-20 MG/5ML suspension 30 mL  30 mL Oral Q6H PRN Rankin, Shuvon B, NP      . clonazePAM (KLONOPIN) tablet 1 mg  1 mg Oral Daily Antonieta Pert, MD   1 mg at 08/04/19 0804  . docusate sodium (COLACE) capsule 100 mg  100 mg Oral Daily PRN Antonieta Pert, MD   100 mg at 08/03/19 1154  . doxycycline (VIBRA-TABS) tablet 100 mg  100 mg Oral Q12H Rankin, Shuvon B, NP   100 mg at 08/04/19 0804  . FLUoxetine (PROZAC) capsule 40 mg  40 mg Oral Daily  Rankin, Shuvon B, NP   40 mg at 08/04/19 0804  . hydrOXYzine (ATARAX/VISTARIL) tablet 25 mg  25 mg Oral TID PRN Jearld Lesch, NP   25 mg at 08/03/19 2108  . ibuprofen (ADVIL) tablet 600 mg  600 mg Oral Q6H PRN Clary,  Marlane MingleGreg Lawson, MD   600 mg at 08/04/19 0805  . lamoTRIgine (LAMICTAL) tablet 50 mg  50 mg Oral QHS Rankin, Shuvon B, NP   50 mg at 08/03/19 2107  . metroNIDAZOLE (FLAGYL) tablet 500 mg  500 mg Oral Q12H Rankin, Shuvon B, NP   500 mg at 08/04/19 0804  . mirtazapine (REMERON) tablet 15 mg  15 mg Oral QHS Antonieta Pertlary, Greg Lawson, MD   15 mg at 08/03/19 2107  . nicotine (NICODERM CQ - dosed in mg/24 hours) patch 14 mg  14 mg Transdermal Daily Antonieta Pertlary, Greg Lawson, MD   14 mg at 08/04/19 0806  . ondansetron (ZOFRAN) tablet 4 mg  4 mg Oral Q8H PRN Rankin, Shuvon B, NP   4 mg at 08/03/19 1154  . prazosin (MINIPRESS) capsule 2 mg  2 mg Oral QHS Antonieta Pertlary, Greg Lawson, MD   2 mg at 08/03/19 2107  . QUEtiapine (SEROQUEL) tablet 600 mg  600 mg Oral QHS Antonieta Pertlary, Greg Lawson, MD   600 mg at 08/03/19 2107    Lab Results: No results found for this or any previous visit (from the past 48 hour(s)).  Blood Alcohol level:  Lab Results  Component Value Date   ETH <10 08/01/2019   ETH <10 07/30/2019    Metabolic Disorder Labs: Lab Results  Component Value Date   HGBA1C 4.6 (L) 09/25/2018   MPG 85.32 09/25/2018   MPG 103 03/24/2014   Lab Results  Component Value Date   PROLACTIN 6.7 08/03/2013   Lab Results  Component Value Date   CHOL 258 (H) 09/26/2018   TRIG 69 09/26/2018   HDL 46 09/26/2018   CHOLHDL 5.6 09/26/2018   VLDL 14 09/26/2018   LDLCALC 198 (H) 09/26/2018   LDLCALC 132 (H) 11/04/2016    Physical Findings: AIMS:  , ,  ,  ,    CIWA:    COWS:     Musculoskeletal: Strength & Muscle Tone: within normal limits Gait & Station: normal Patient leans: N/A  Psychiatric Specialty Exam: Physical Exam  Nursing note and vitals reviewed. Constitutional: She is oriented to person,  place, and time. She appears well-developed and well-nourished.  HENT:  Head: Normocephalic and atraumatic.  Respiratory: Effort normal.  Neurological: She is alert and oriented to person, place, and time.    ROS  Blood pressure (!) 133/104, pulse 98, temperature (!) 97.3 F (36.3 C), resp. rate 18, height 5\' 7"  (1.702 m), weight (!) 142.9 kg, last menstrual period 07/11/2019, SpO2 98 %.Body mass index is 49.34 kg/m.  General Appearance: Casual  Eye Contact:  Fair  Speech:  Normal Rate  Volume:  Increased  Mood:  Dysphoric and Irritable  Affect:  Congruent  Thought Process:  Coherent and Descriptions of Associations: Circumstantial  Orientation:  Full (Time, Place, and Person)  Thought Content:  Logical  Suicidal Thoughts:  No  Homicidal Thoughts:  No  Memory:  Immediate;   Fair Recent;   Fair Remote;   Fair  Judgement:  Intact  Insight:  Lacking  Psychomotor Activity:  Increased  Concentration:  Concentration: Fair and Attention Span: Fair  Recall:  [employer]Fair  Fund of Knowledge:  Fair  Language:  Good  Akathisia:  Negative  Handed:  Right  AIMS (if indicated):     Assets:  Desire for Improvement Resilience  ADL's:  Intact  Cognition:  WNL  Sleep:  Number of Hours: 6.75     Treatment Plan Summary: Daily contact with patient to assess and evaluate  symptoms and progress in treatment, Medication management and Plan : Patient is seen and examined.  Patient is a 23 year old female with the above-stated past psychiatric history who is seen in follow-up.   Diagnosis: #1 borderline personality disorder, #2 posttraumatic stress disorder, #3 reported history of bipolar disorder type II, #4 elevated blood pressure.  Patient is seen in follow-up.  She is essentially unchanged.  The patient has basically been in and out of the hospital over the last 6 months.  She has been asked to leave her grandmother's home because of her behavior.  That really has not changed here.  She is at times  demanding and abusive as well as profane.  She can be redirected to a certain degree.  According to social work she apparently has been accepted into a program to assist with homelessness.  The plan will be to be able to get her discharged out for that program tomorrow.  No change in her current medications.  Hopefully things will go well over the next 24 hours so that we can assist in her transition. 1.  Continue clonazepam 1 mg p.o. daily for anxiety. 2.  Continue doxycycline 100 mg p.o. every 12 hours for 5 days. 3.  Continue fluoxetine 40 mg p.o. daily for anxiety and depression. 4.  Continue hydroxyzine 25 mg p.o. 3 times daily as needed anxiety. 5.  Continue ibuprofen 600 mg p.o. every 6 hours as needed pain. 6.  Continue Lamictal 50 mg p.o. nightly for mood stability. 7.  Continue Flagyl 500 mg p.o. every 12 hours for 5 days. 8.  Continue prazosin 2 mg p.o. nightly for nightmares and flashbacks. 9.  Continue mirtazapine 15 mg p.o. nightly for mood, anxiety and sleep. 10.  Continue Seroquel 600 mg p.o. nightly for sleep, mood stability. 11.  Disposition planning-hopefully to be able to get involved with the homeless program tomorrow.  Antonieta Pert, MD 08/04/2019, 11:09 AM

## 2019-08-04 NOTE — [facility] Group Notes (Signed)
LCSW Aftercare Discharge Planning Group Note  08/04/2019   Type of Group and Topic: Psychoeducational Group: Discharge Planning  Participation Level: Active  Description of Group Discharge planning group reviews patient's anticipated discharge plans and assists patients to anticipate and address any barriers to wellness/recovery in the community. Suicide prevention education is reviewed with patients in group.  Therapeutic Goals:  1. Patients will state their anticipated discharge plan and mental health aftercare  2. Patients will identify potential barriers to wellness in the community setting  3. Patients will engage in problem solving, solution focused discussion of ways to anticipate and address barriers to wellness/recovery  Summary of Patient Progress: Misty Jimenez was engaged and participated throughout the group session. Misty Jimenez reports she is discouraged about her discharge planning. She recently learned that she does not have a shelter bed. She reports that she is very concerned about her living arrangements due to her discharge date approaching. Misty Jimenez reports that she would like to improve her emotional and physical wellness.    Plan for Discharge/Comments: To be determined   Transportation Means: To be determined   Supports: Reports having none.   Therapeutic Modalities: Motivational Interviewing; Psycho-Educations  Misty, Jimenez  08/04/2019 2:38 PM

## 2019-08-04 NOTE — [facility] Group Notes (Signed)
[place] Group Notes:  (Nursing/MHT/Case Management/Adjunct)  Date:  08/04/2019  Time:  10:00 AM  Type of Therapy:  Nurse Education  Participation Level:  Active  Participation Quality:  Monopolizing and Redirectable  Affect:  Excited, Not Congruent and Tearful  Cognitive:  Alert and Lacking  Insight:  Lacking  Engagement in Group:  Engaged, Lacking, Monopolizing, Off Topic and Supportive  Modes of Intervention:  Discussion, Education, Exploration, Socialization and Support  Summary of Progress/Problems: pt's discussed crisis management and how this related to past/current crisis they were/are facing. Pt's were guided through their suicide safety plan and how each section could aid in preventing another crisis from escalating. Pt's shared their resources to help others realize coping skills, support systems and resources that could be utilized. Pt shared multiple times but never had insight as to the connection with moving past these traumas. Pt became caught up in "one upping" other's stories. Pt was monopolizing/dominating when others spoke sometimes but was easily redirectable. Pt was pleasant but continues to be inappropriate with others.  Otelia Limes Alonzo Owczarzak 08/04/2019, 12:10 PM

## 2019-08-04 NOTE — Progress Notes (Signed)
Pt was seen touching and holding another patient hand. Pt allowed her peer to sit on her lap. Pt and her peer were kissing on the cheek. Writer also observed pt touching and being inappropriate with a female peer. Pt was asked by writer to stop inappropriately touching each other. Staffed explained to patient and her peers the rules on the unit and the reason for these rules. Pt was told by another staff member not to  inappropriately touch after writer explained the rules.

## 2019-08-04 NOTE — [facility] Group Notes (Signed)
[place] Group Notes:  (Nursing/MHT/Case Management/Adjunct)  Date:  08/03/2019  Time:  9:00 AM  Type of Therapy:  Nurse Education  Participation Level:  Active  Participation Quality:  Intrusive, Redirectable and Sharing  Affect:  Anxious and Excited  Cognitive:  Alert and Lacking  Insight:  Lacking  Engagement in Group:  Distracting, Lacking, Monopolizing and Supportive  Modes of Intervention:  Discussion, Education, Exploration, Socialization and Support  Summary of Progress/Problems: Pt's dicussed their personal development and how goal setting could/can help them achieve this. Pt's were educated on what SMART goal setting is, and how they can use this for short and long term goal setting. Pt's discussed their goal for the day and how they hoped to obtain this goal. If the goal wasn't SMART, then the pt was asked to formulate a new goal. Lastly, pt's identified needs being met and those needs that possibly aren't being met hindering them from self-actualization.  Pt was attentive but also monopolizing of others time when they were sharing. Pt was intrusive and interrupted many times. Pt was loud and had frequently sidebar conversations. Pt gave little insight into their ability for goal setting.  Otelia Limes [place] 08/04/2019, 8:23 AM

## 2019-08-04 NOTE — Progress Notes (Signed)
Patient ID: Misty Jimenez, female   DOB: 1996/07/02, 23 y.o.   MRN: [id number] Misty Jimenez NOVEL CORONAVIRUS (COVID-19) DAILY CHECK-OFF SYMPTOMS - answer yes or no to each - every day NO YES  Have you had a fever in the past 24 hours?  . Fever (Temp > 37.80C / 100F) X   Have you had any of these symptoms in the past 24 hours? . New Cough .  Sore Throat  .  Shortness of Breath .  Difficulty Breathing .  Unexplained Body Aches   X   Have you had any one of these symptoms in the past 24 hours not related to allergies?   . Runny Nose .  Nasal Congestion .  Sneezing   X   If you have had runny nose, nasal congestion, sneezing in the past 24 hours, has it worsened?  X   EXPOSURES - check yes or no X   Have you traveled outside the state in the past 14 days?  X   Have you been in contact with someone with a confirmed diagnosis of COVID-19 or PUI in the past 14 days without wearing appropriate PPE?  X   Have you been living in the same home as a person with confirmed diagnosis of COVID-19 or a PUI (household contact)?    X   Have you been diagnosed with COVID-19?    X              What to do next: Answered NO to all: Answered YES to anything:   Proceed with unit schedule Follow the [facility] Inpatient Flowsheet.

## 2019-08-05 MED ORDER — HYDROXYZINE HCL 50 MG PO TABS: 50.0000 mg | ORAL_TABLET | Freq: Three times a day (TID) | ORAL | 0 refills | Status: DC | PRN

## 2019-08-05 MED ORDER — MIRTAZAPINE 15 MG PO TABS: 15.0000 mg | ORAL_TABLET | Freq: Every day | ORAL | 0 refills | Status: DC

## 2019-08-05 MED ORDER — METRONIDAZOLE 500 MG PO TABS: 500.0000 mg | ORAL_TABLET | Freq: Two times a day (BID) | ORAL | Status: DC

## 2019-08-05 MED ORDER — DOCUSATE SODIUM 100 MG PO CAPS: 100.0000 mg | ORAL_CAPSULE | Freq: Every day | ORAL | 0 refills | Status: DC | PRN

## 2019-08-05 MED ORDER — CLONAZEPAM 1 MG PO TABS: 1.0000 mg | ORAL_TABLET | Freq: Every day | ORAL | 0 refills | Status: DC

## 2019-08-05 MED ORDER — DOXYCYCLINE HYCLATE 100 MG PO TABS: 100.0000 mg | ORAL_TABLET | Freq: Two times a day (BID) | ORAL | Status: DC

## 2019-08-05 MED ORDER — LAMOTRIGINE 25 MG PO TABS: 50.0000 mg | ORAL_TABLET | Freq: Every day | ORAL | 0 refills | Status: DC

## 2019-08-05 MED ORDER — FLUOXETINE HCL 40 MG PO CAPS: 40.0000 mg | ORAL_CAPSULE | Freq: Every day | ORAL | 0 refills | Status: DC

## 2019-08-05 MED ORDER — PRAZOSIN HCL 1 MG PO CAPS: 2.0000 mg | ORAL_CAPSULE | Freq: Every day | ORAL | 0 refills | Status: DC

## 2019-08-05 MED ORDER — NICOTINE 14 MG/24HR TD PT24: 14.0000 mg | MEDICATED_PATCH | Freq: Every day | TRANSDERMAL | 0 refills | Status: DC

## 2019-08-05 MED ORDER — QUETIAPINE FUMARATE 300 MG PO TABS: 600.0000 mg | ORAL_TABLET | Freq: Every day | ORAL | 0 refills | Status: DC

## 2019-08-05 NOTE — Progress Notes (Signed)
Patient ID: Misty Jimenez, female   DOB: 1996/10/17, 23 y.o.   MRN: [id number]   D: Patient pleasant on approach tonight. Patient intrusive at times and has been told she needs to focus on herself. Talked about all the times that she has been here at this hospital. Reports some passive SI at times but no active SI tonight. Took all meds without issue. A: Staff will continue to monitor on q 15 minute checks, follow treatment plan, and give medications as ordered. R: Cooperative on the unit

## 2019-08-05 NOTE — Progress Notes (Signed)
  [facility] Adult Case Management Discharge Plan :  Will you be returning to the same living situation after discharge:  No. Patient reports she is discharging home with a close friend. She shared she plans to stay with her friend temporarily, until a bed becomes available. At discharge, do you have transportation home?: Yes,  patient reports her close friend is picking her up  Do you have the ability to pay for your medications: No.  Release of information consent forms completed and in the chart;  Patient's signature needed at discharge.  Patient to Follow up at: Follow-up [facility] Follow up on 08/08/2019.   Why: Medication management with Carin Hock is Monday, 10/12 at 3:00p.  Therapy with Lars Masson is Tuesday, 10/13 at 10:00a.  Appts will be virtual.  Contact information: [place] [address] [phone]           Next level of care provider has access to [place] and Suicide Prevention discussed: Yes,  with the patient     Has patient been referred to the Quitline?: Patient refused referral  Patient has been referred for addiction treatment: N/A  Misty Jimenez, LCSWA 08/05/2019, 9:41 AM

## 2019-08-05 NOTE — Progress Notes (Signed)
Pt discharged to lobby. Pt was stable and appreciative at that time. All papers, samples and prescriptions were given and valuables returned. Verbal understanding expressed. Denies SI/HI and A/VH. Pt given opportunity to express concerns and ask questions.  

## 2019-08-05 NOTE — [facility] Suicide Risk Assessment (Signed)
[facility] Discharge Suicide Risk Assessment   Principal Problem: <principal problem not specified> Discharge Diagnoses: Active Problems:   Posttraumatic stress disorder   MDD (major depressive disorder), recurrent, severe, with psychosis ([place])   Borderline personality disorder ([place])   Bipolar 2 disorder ([place])   Total Time spent with patient: 20 minutes  Musculoskeletal: Strength & Muscle Tone: within normal limits Gait & Station: normal Patient leans: N/A  Psychiatric Specialty Exam: Review of Systems  All other systems reviewed and are negative.   Blood pressure 101/89, pulse (!) 116, temperature 98.5 F (36.9 C), resp. rate 18, height 5\' 7"  (1.702 m), weight (!) 142.9 kg, last menstrual period 07/11/2019, SpO2 98 %.Body mass index is 49.34 kg/m.  General Appearance: Casual  Eye Contact::  Fair  Speech:  Normal Rate409  Volume:  Normal  Mood:  Anxious  Affect:  Congruent  Thought Process:  Coherent and Descriptions of Associations: Circumstantial  Orientation:  Full (Time, Place, and Person)  Thought Content:  Logical  Suicidal Thoughts:  No  Homicidal Thoughts:  No  Memory:  Immediate;   Fair Recent;   Fair Remote;   Fair  Judgement:  Intact  Insight:  Lacking  Psychomotor Activity:  Normal  Concentration:  Fair  Recall:  [employer] of Knowledge:Fair  Language: Fair  Akathisia:  Negative  Handed:  Right  AIMS (if indicated):     Assets:  Desire for Improvement Resilience  Sleep:  Number of Hours: 6.75  Cognition: WNL  ADL's:  Intact   Mental Status Per Nursing Assessment::   On Admission:  Suicidal ideation indicated by patient  Demographic Factors:  Caucasian, Low socioeconomic status and Unemployed  Loss Factors: Financial problems/change in socioeconomic status  Historical Factors: Impulsivity  Risk Reduction Factors:   NA  Continued Clinical Symptoms:  Bipolar Disorder:   Mixed State Personality Disorders:   Cluster B More than one psychiatric  diagnosis Previous Psychiatric Diagnoses and Treatments  Cognitive Features That Contribute To Risk:  Thought constriction (tunnel vision)    Suicide Risk:  Minimal: No identifiable suicidal ideation.  Patients presenting with no risk factors but with morbid ruminations; may be classified as minimal risk based on the severity of the depressive symptoms  [facility] Follow up on 08/08/2019.   Why: Medication management with Carin Hock is Monday, 10/12 at 3:00p.  Therapy with Lars Masson is Tuesday, 10/13 at 10:00a.  Appts will be virtual.  Contact information: [address] [place] [address] [phone]           Plan Of Care/Follow-up recommendations:  Activity:  ad lib  Sharma Covert, MD 08/05/2019, 8:57 AM

## 2019-08-05 NOTE — Progress Notes (Signed)
Recreation Therapy Notes  Date:  10.9.20 Time: 0930 Location: [address] Dayroom  Group Topic: Stress Management  Goal Area(s) Addresses:  Patient will identify positive stress management techniques. Patient will identify benefits of using stress management post d/c.  Behavioral Response:  Engaged  Intervention: Stress Management  Activity :  Meditation.  LRT played the stress management technique of meditation.  LRT played a meditation that focused on making the most of your day.  Patients were to follow along as meditation played to participate in activity.  Education:  Stress Management, Discharge Planning.   Education Outcome: Acknowledges Education  Clinical Observations/Feedback:  Pt attended and participated in activity.     Victorino Sparrow, LRT/CTRS         Ria Comment, [place] A 08/05/2019 10:55 AM
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
## 2019-09-01 ENCOUNTER — Ambulatory Visit: Payer: Self-pay | Admitting: Family Medicine

## 2019-09-08 ENCOUNTER — Ambulatory Visit (INDEPENDENT_AMBULATORY_CARE_PROVIDER_SITE_OTHER): Payer: No Typology Code available for payment source | Admitting: Family Medicine

## 2019-09-08 ENCOUNTER — Encounter: Payer: Self-pay | Admitting: Family Medicine

## 2019-09-08 ENCOUNTER — Other Ambulatory Visit: Payer: Self-pay

## 2019-09-08 VITALS — BP 101/67 | HR 98 | Ht 67.0 in | Wt 300.0 lb

## 2019-09-08 DIAGNOSIS — Z23 Encounter for immunization: Secondary | ICD-10-CM

## 2019-09-08 DIAGNOSIS — Z30017 Encounter for initial prescription of implantable subdermal contraceptive: Secondary | ICD-10-CM

## 2019-09-08 DIAGNOSIS — Z3202 Encounter for pregnancy test, result negative: Secondary | ICD-10-CM

## 2019-09-08 LAB — POCT URINE PREGNANCY: Preg Test, Ur: NEGATIVE

## 2019-09-08 MED ORDER — ETONOGESTREL 68 MG ~~LOC~~ IMPL
68.0000 mg | DRUG_IMPLANT | Freq: Once | SUBCUTANEOUS | Status: AC
  Administered 2019-09-08: 68 mg via SUBCUTANEOUS

## 2019-09-08 NOTE — Progress Notes (Signed)
Nexplanon Insertion:  Patient given informed consent, signed copy in the chart, time out was performed. Pregnancy test was neg. Appropriate time out taken.  Patient's right arm was prepped and draped in the usual sterile fashion.. The ruler used to measure and mark insertion area.  Pt was prepped with alcohol swab and then injected with 3 cc of 2% lidocaine with epinephrine.  Pt was prepped with betadine, Implanon removed form packaging,  Device confirmed in needle, then inserted full length of needle and withdrawn per handbook instructions.  Device palpated by physician and patient.  Pt insertion site covered with pressure dressing.   Minimal blood loss.  Pt tolerated the procedure well.

## 2019-09-08 NOTE — Patient Instructions (Signed)
Nexplanon Instructions After Insertion  Keep bandage clean and dry for 24 hours  May use ice/Tylenol/Ibuprofen for soreness or pain  If you develop fever, drainage or increased warmth from incision site-contact office immediately   

## 2019-10-19 IMAGING — US US PELVIS COMPLETE
1 series · 13 of 25 positions shown · non-contrast
Comparison: 12/13/2018

CLINICAL DATA: Pelvic pain and vaginal bleeding. Clinical suspicion
for ovarian torsion.

EXAM:
TRANSABDOMINAL AND TRANSVAGINAL ULTRASOUND OF PELVIS
DOPPLER ULTRASOUND OF OVARIES
TECHNIQUE: Both transabdominal and transvaginal ultrasound examinations of the
pelvis were performed. Transabdominal technique was performed for
global imaging of the pelvis including uterus, ovaries, adnexal
regions, and pelvic cul-de-sac.
It was necessary to proceed with endovaginal exam following the
transabdominal exam to visualize the endometrium and ovaries. Color
and duplex Doppler ultrasound was utilized to evaluate blood flow to
the ovaries.

[Series 1: us pelvis complete · 13 of 60 slices shown]
[im 1/60]
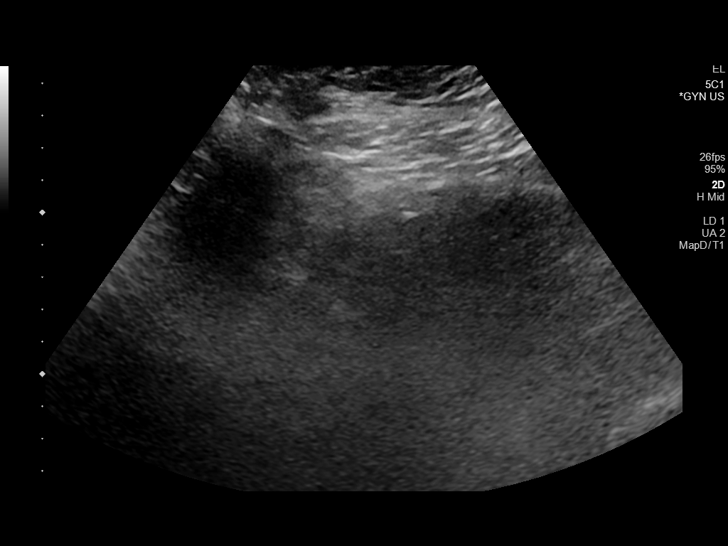
[im 5/60]
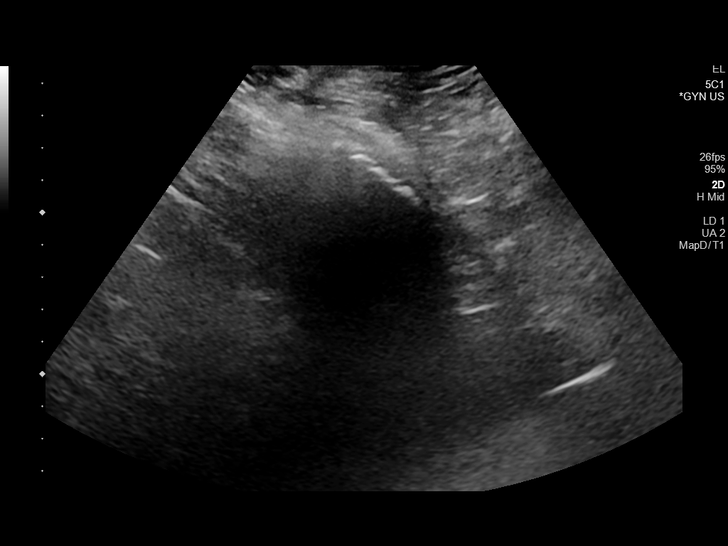
[im 10/60]
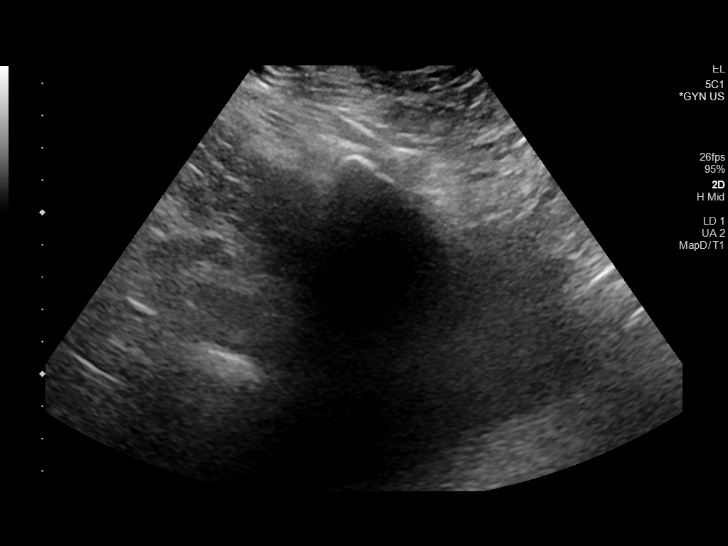
[im 15/60]
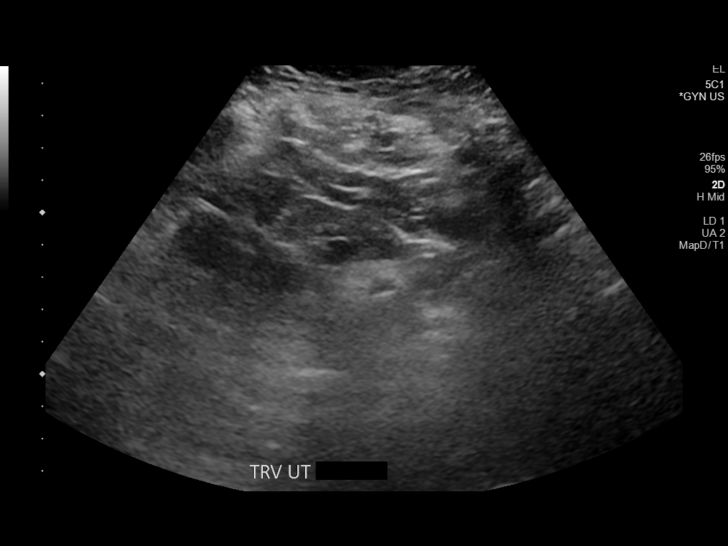
[im 20/60]
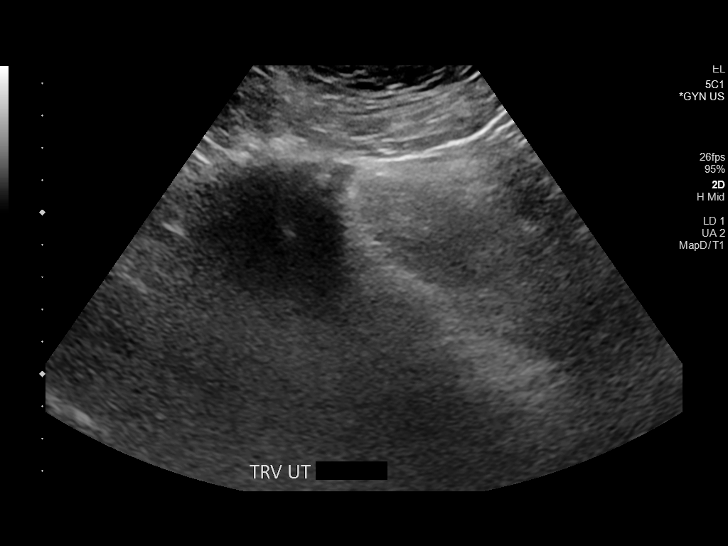
[im 25/60]
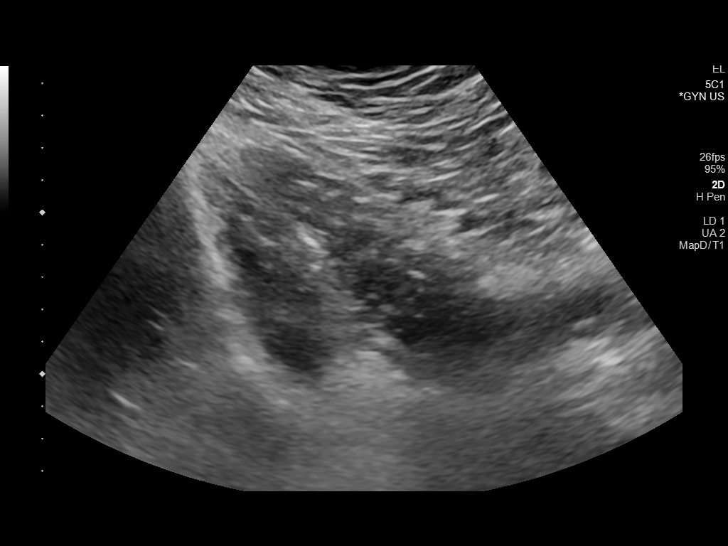
[im 30/60]
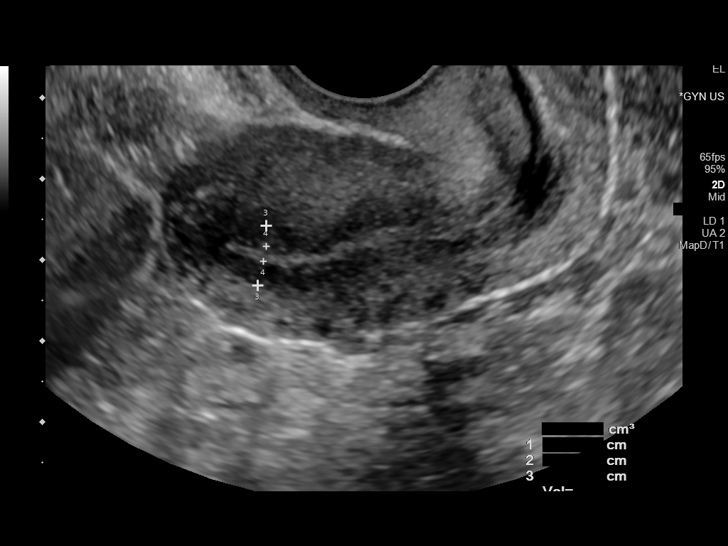
[im 35/60]
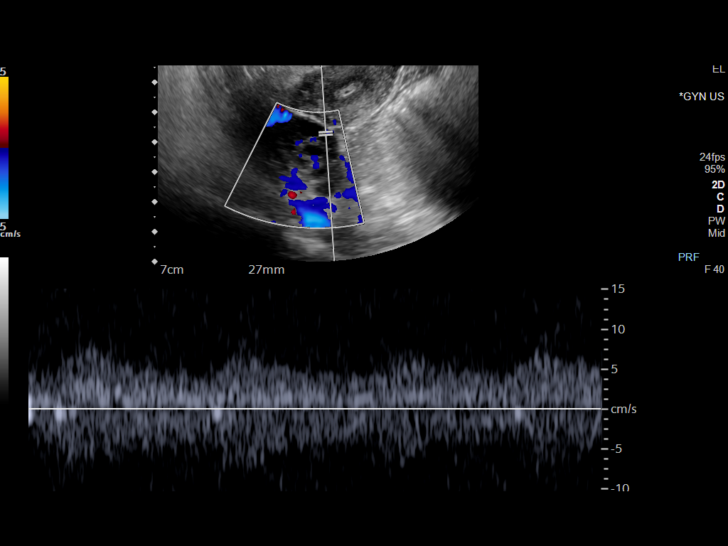
[im 40/60]
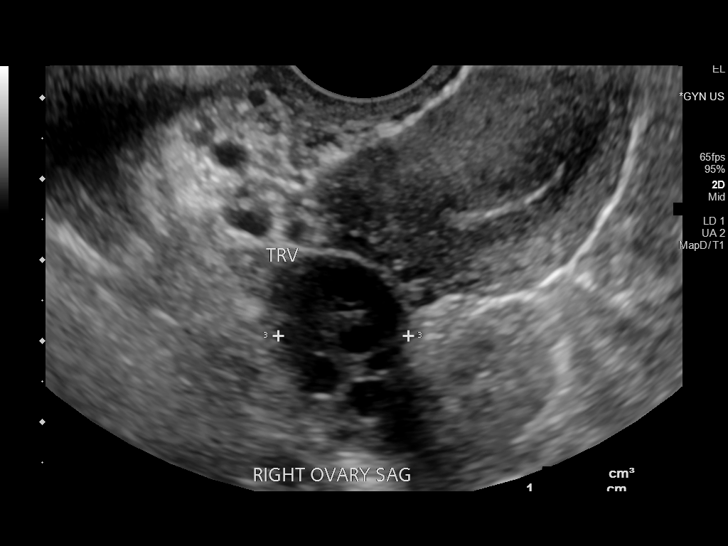
[im 45/60]
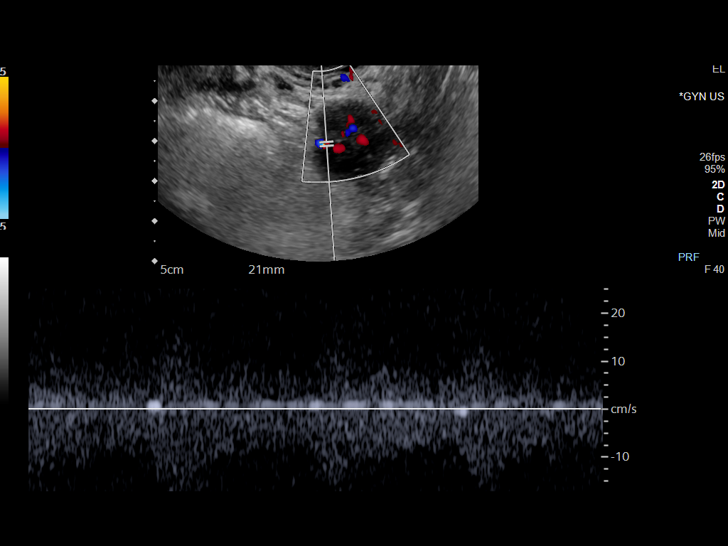
[im 50/60]
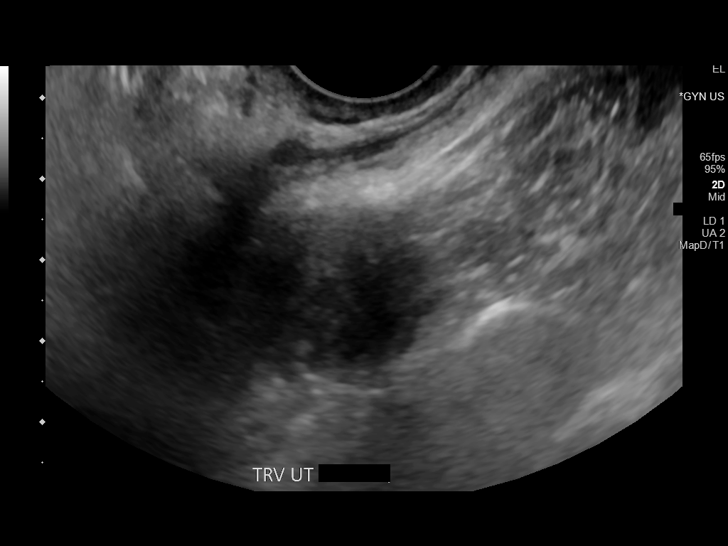
[im 55/60]
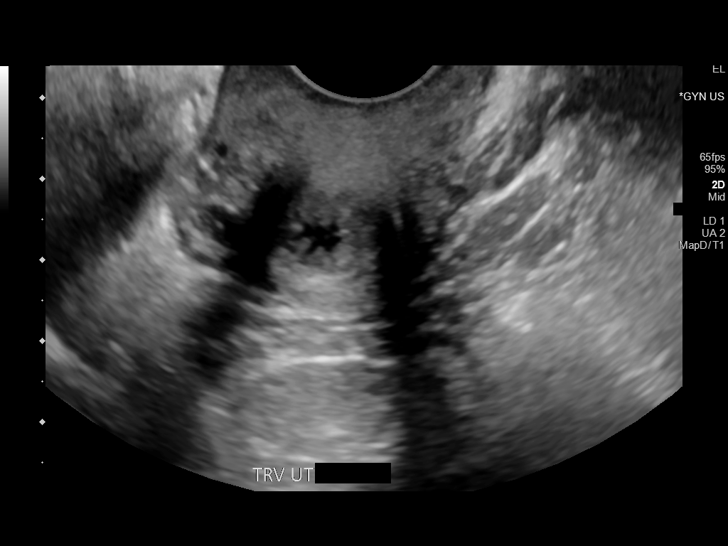
[im 60/60]
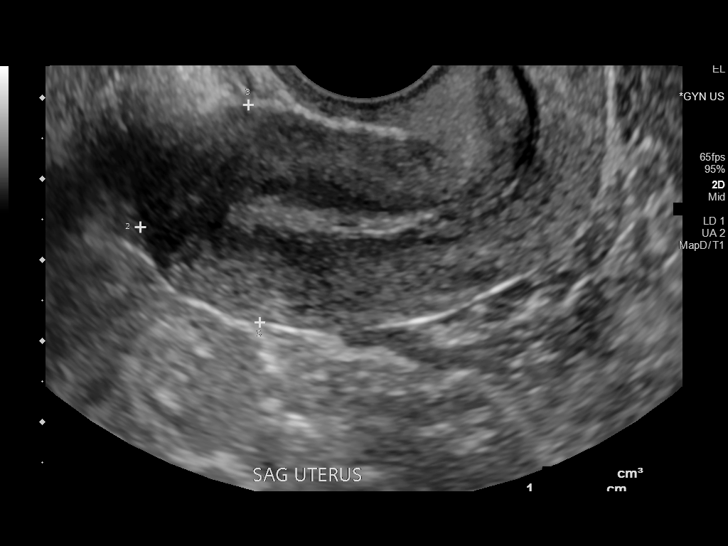

[13 of 25 positions shown; findings below may reference images not displayed]

FINDINGS: Uterus

Measurements: 6.1 x 2.7 x 3.5 cm = volume: 30 mL. No fibroids or
other mass visualized.

Endometrium

Thickness: 4 mm.  No focal abnormality visualized.

Right ovary

Measurements: 3.1 x 1.7 x 1.6 cm = volume: 4.4 mL. Normal
appearance/no adnexal mass.

Left ovary

Measurements: 2.1 x 2.0 x 1.9 cm = volume: 4.0 mL. Normal
appearance/no adnexal mass.

Pulsed Doppler evaluation of both ovaries demonstrates normal
low-resistance arterial and venous waveforms.

Other findings

No abnormal free fluid.
IMPRESSION: Negative. No pelvic mass or other significant abnormality
identified.

No sonographic evidence for ovarian torsion.

## 2019-10-19 IMAGING — US US ART/VEN ABD/PELV/SCROTUM DOPPLER LTD
1 series · 13 of 25 positions shown · non-contrast
Comparison: 12/13/2018

CLINICAL DATA: Pelvic pain and vaginal bleeding. Clinical suspicion
for ovarian torsion.

EXAM:
TRANSABDOMINAL AND TRANSVAGINAL ULTRASOUND OF PELVIS
DOPPLER ULTRASOUND OF OVARIES
TECHNIQUE: Both transabdominal and transvaginal ultrasound examinations of the
pelvis were performed. Transabdominal technique was performed for
global imaging of the pelvis including uterus, ovaries, adnexal
regions, and pelvic cul-de-sac.
It was necessary to proceed with endovaginal exam following the
transabdominal exam to visualize the endometrium and ovaries. Color
and duplex Doppler ultrasound was utilized to evaluate blood flow to
the ovaries.

[Series 1: us art/ven abd/pelv/scrotum doppler ltd · 13 of 60 slices shown]
[im 1/60]
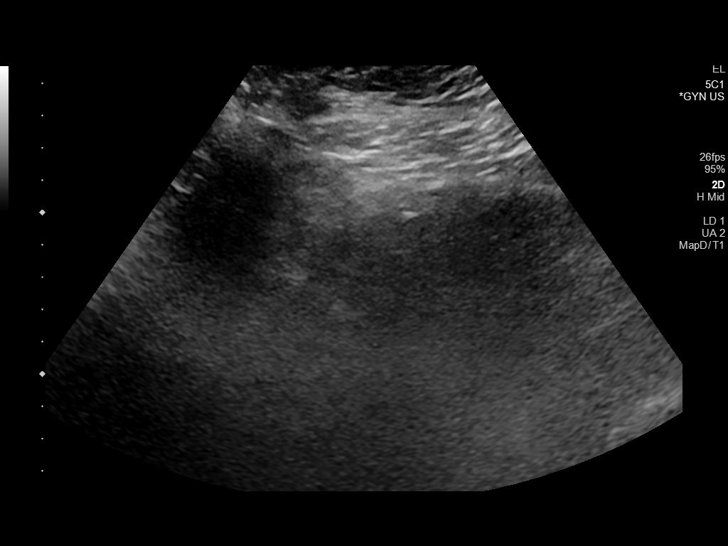
[im 5/60]
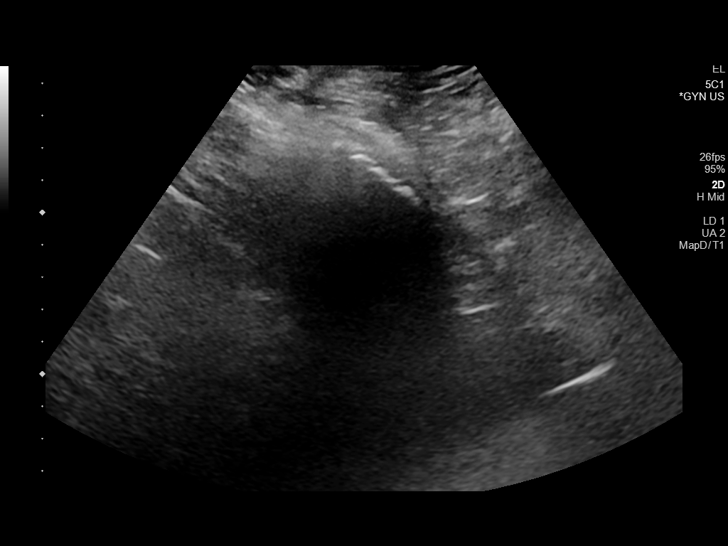
[im 10/60]
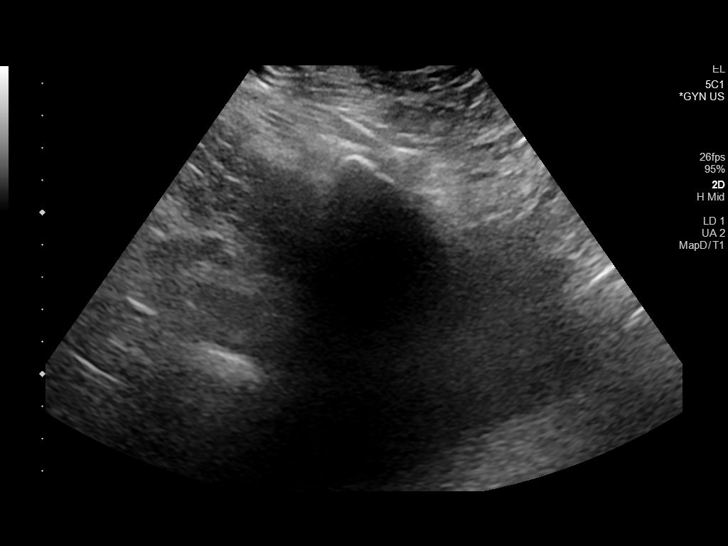
[im 15/60]
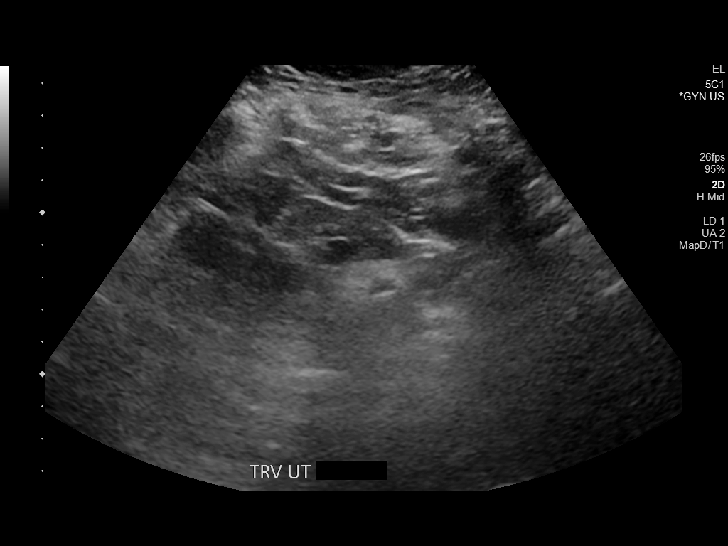
[im 20/60]
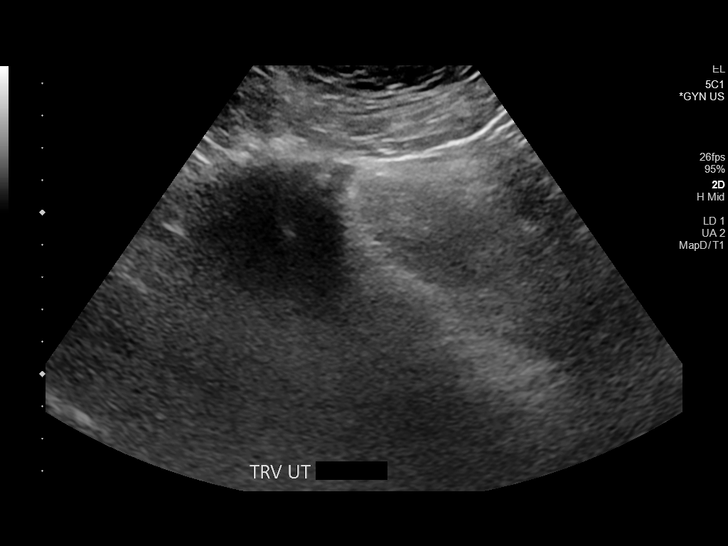
[im 25/60]
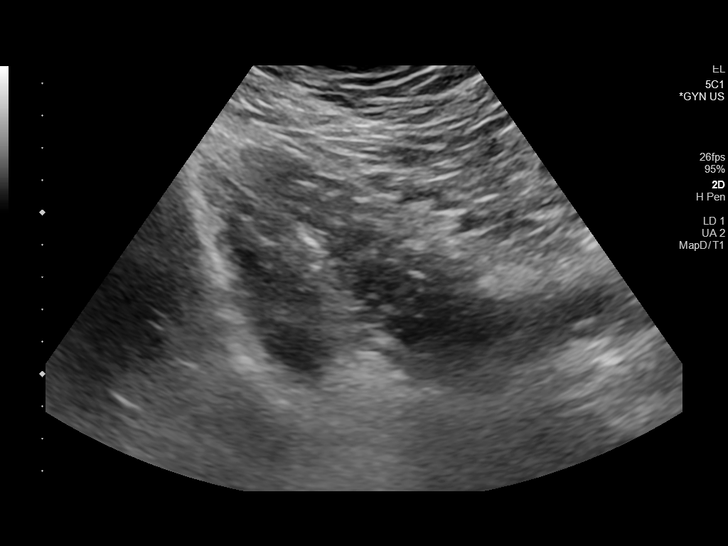
[im 30/60]
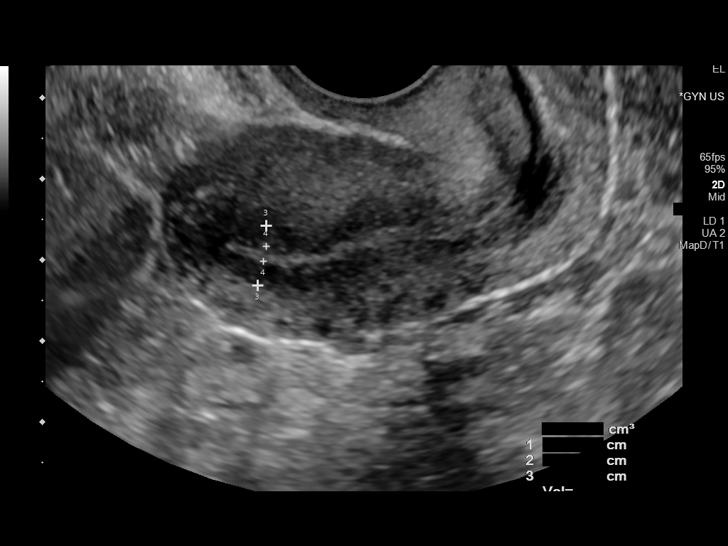
[im 35/60]
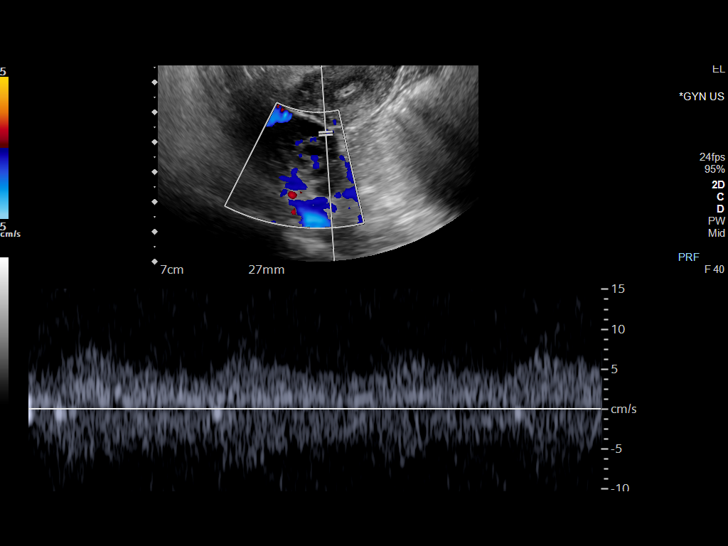
[im 40/60]
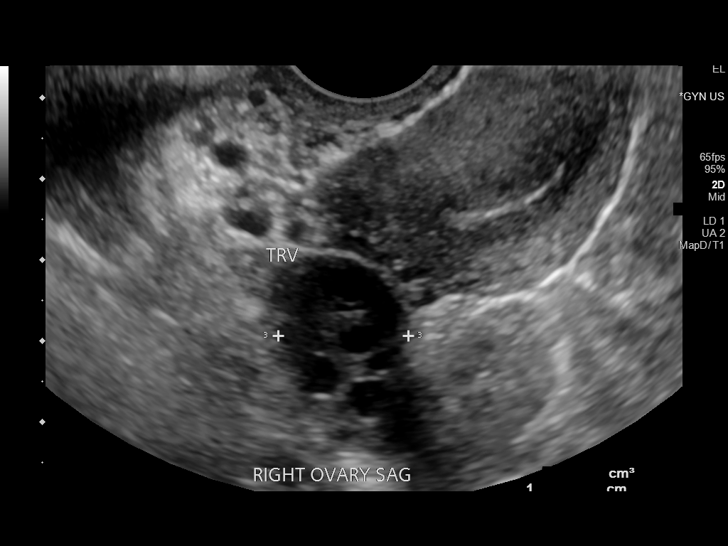
[im 45/60]
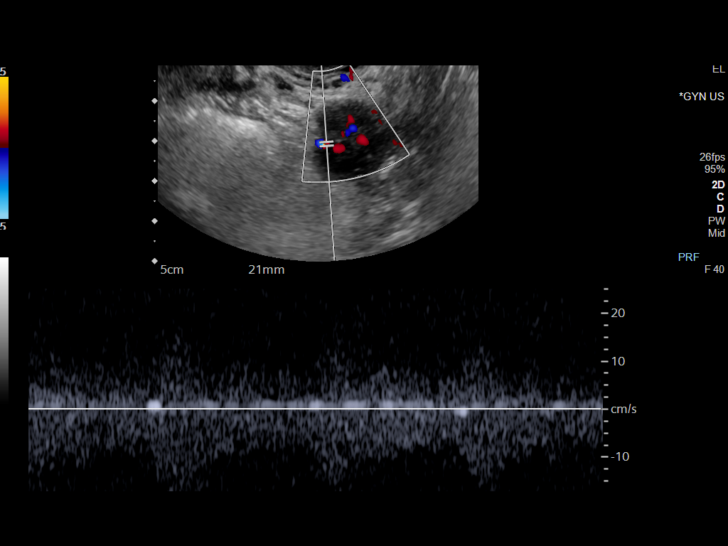
[im 50/60]
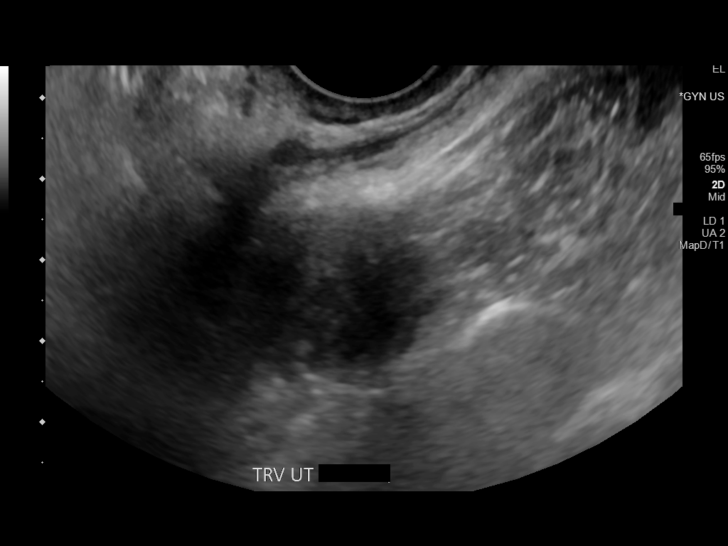
[im 55/60]
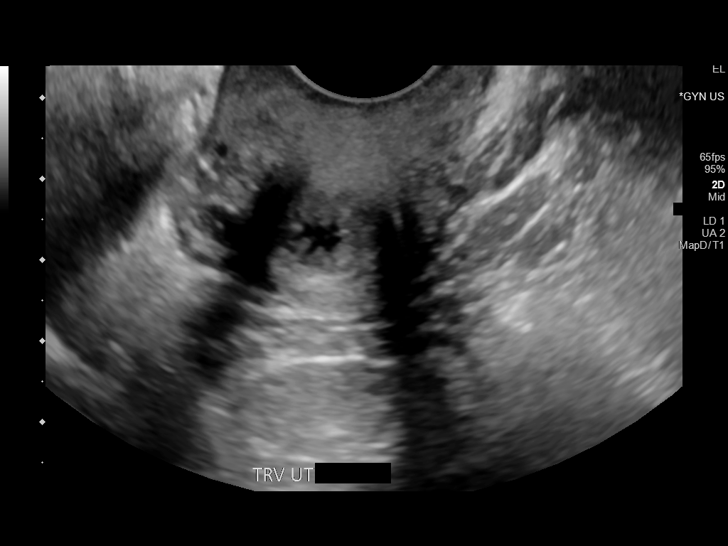
[im 60/60]
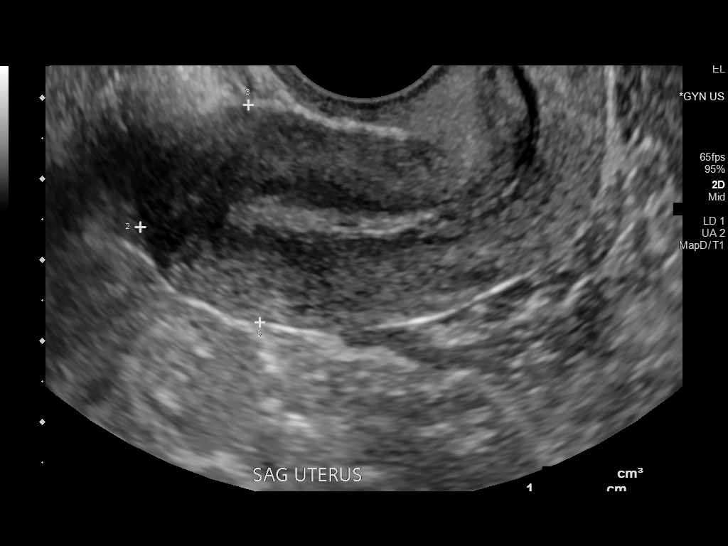

[13 of 25 positions shown; findings below may reference images not displayed]

FINDINGS: Uterus

Measurements: 6.1 x 2.7 x 3.5 cm = volume: 30 mL. No fibroids or
other mass visualized.

Endometrium

Thickness: 4 mm.  No focal abnormality visualized.

Right ovary

Measurements: 3.1 x 1.7 x 1.6 cm = volume: 4.4 mL. Normal
appearance/no adnexal mass.

Left ovary

Measurements: 2.1 x 2.0 x 1.9 cm = volume: 4.0 mL. Normal
appearance/no adnexal mass.

Pulsed Doppler evaluation of both ovaries demonstrates normal
low-resistance arterial and venous waveforms.

Other findings

No abnormal free fluid.
IMPRESSION: Negative. No pelvic mass or other significant abnormality
identified.

No sonographic evidence for ovarian torsion.

## 2019-12-26 ENCOUNTER — Telehealth: Payer: Self-pay

## 2019-12-26 NOTE — Telephone Encounter (Signed)
Patient called in to see if Dr. Abner Greenspan could send in a prescription for her inhaler to her pharmacy.   Please call the patient back at [phone]  Thanks,

## 2019-12-27 ENCOUNTER — Other Ambulatory Visit: Payer: Self-pay | Admitting: Family Medicine

## 2019-12-27 ENCOUNTER — Encounter: Payer: Self-pay | Admitting: Family Medicine

## 2019-12-27 ENCOUNTER — Ambulatory Visit (INDEPENDENT_AMBULATORY_CARE_PROVIDER_SITE_OTHER): Payer: No Typology Code available for payment source | Admitting: Family Medicine

## 2019-12-27 ENCOUNTER — Telehealth: Payer: Self-pay | Admitting: Family Medicine

## 2019-12-27 DIAGNOSIS — R509 Fever, unspecified: Secondary | ICD-10-CM

## 2019-12-27 DIAGNOSIS — J069 Acute upper respiratory infection, unspecified: Secondary | ICD-10-CM

## 2019-12-27 DIAGNOSIS — J989 Respiratory disorder, unspecified: Secondary | ICD-10-CM

## 2019-12-27 DIAGNOSIS — F4310 Post-traumatic stress disorder, unspecified: Secondary | ICD-10-CM

## 2019-12-27 DIAGNOSIS — Z720 Tobacco use: Secondary | ICD-10-CM

## 2019-12-27 HISTORY — DX: Respiratory disorder, unspecified: R50.9

## 2019-12-27 HISTORY — DX: Fever, unspecified: J98.9

## 2019-12-27 MED ORDER — METHYLPREDNISOLONE 4 MG PO TABS: ORAL_TABLET | ORAL | 0 refills | Status: DC

## 2019-12-27 MED ORDER — BENZONATATE 100 MG PO CAPS: 100.0000 mg | ORAL_CAPSULE | Freq: Three times a day (TID) | ORAL | 1 refills | Status: DC | PRN

## 2019-12-27 MED ORDER — ALBUTEROL SULFATE HFA 108 (90 BASE) MCG/ACT IN AERS: 1.0000 | INHALATION_SPRAY | Freq: Four times a day (QID) | RESPIRATORY_TRACT | 2 refills | Status: DC | PRN

## 2019-12-27 NOTE — Telephone Encounter (Signed)
Her inhaler was sent in today

## 2019-12-27 NOTE — Telephone Encounter (Signed)
Pt called stated pharmacy was missing one of her meds that was sent in today.  methylPREDNISolone (MEDROL) 4 MG tablet

## 2019-12-27 NOTE — Telephone Encounter (Signed)
Spoke with pharmacy they stated they are extremely busy and she needs to wait until they are done

## 2019-12-27 NOTE — Assessment & Plan Note (Addendum)
She started with loss of taste, congestion, cough, fatigue, myalgias, headache and nausea and vomiting yesterday, she was tested at [facility] at OR for COVID and was negative yesterday. She is SOB and wheezing. Will prescribe Albuterol inhaler and Medrol dosepak and she is advised to quarantine as if she has COVID and retest in 5-7 days. Hydrate well and use some liquids with potassium in them if vomiting recurs. Omron Blood Pressure cuff, upper arm, want BP 100-140/60-90 Pulse oximeter, want oxygen in 90s  Weekly vitals  Take Multivitamin with minerals, selenium Vitamin D 1000-2000 IU daily Probiotic with lactobacillus and bifidophilus Asprin EC 81 mg daily  Melatonin 2-5 mg at bedtime  [email or web address]

## 2019-12-27 NOTE — Patient Instructions (Signed)
Omron Blood Pressure cuff, upper arm, want BP 100-140/60-90 Pulse oximeter, want oxygen in 90s  Weekly vitals  Take Multivitamin with minerals, selenium Vitamin D 1000-2000 IU daily Probiotic with lactobacillus and bifidophilus Asprin EC 81 mg daily  Melatonin 2-5 mg at bedtime  [email or web address] 

## 2019-12-28 DIAGNOSIS — J069 Acute upper respiratory infection, unspecified: Secondary | ICD-10-CM | POA: Insufficient documentation

## 2019-12-28 DIAGNOSIS — F17200 Nicotine dependence, unspecified, uncomplicated: Secondary | ICD-10-CM | POA: Insufficient documentation

## 2019-12-28 DIAGNOSIS — Z720 Tobacco use: Secondary | ICD-10-CM | POA: Insufficient documentation

## 2019-12-28 NOTE — Progress Notes (Addendum)
Virtual Visit via phone Note  I connected with Misty Jimenez on 12/27/19 at  9:40 AM EST by a phone enabled telemedicine application and verified that I am speaking with the correct person using two identifiers.  Location: Patient: home Provider: office   I discussed the limitations of evaluation and management by telemedicine and the availability of in person appointments. The patient expressed understanding and agreed to proceed.  Subjective:    Patient ID: Misty Jimenez, female    DOB: 22-Nov-1995, 22 y.o.   MRN: [id number]  Chief Complaint  Patient presents with  . Covid symtpoms    sxs started yesterday, major body aches, productive, cough, sob, loss of taste, headache/migraine, fatigue, light fever, covid test negative    HPI Patient is in today for evaluation of respiratory symptoms. Her symptoms became notable yesterday. She notes head congestion, cough, SOB, myalgias, headache, nausea and vomiting. She was tested yesterday at a [facility] with a rapid test and was negative for COVID. She continues to feel badly and has now lost her sense of taste and smell. She does not go out much but when she does she does not wear her mask due to anxiety and ptsd. She has been with 2 friends and her mother and has been to [employer] [place] recently as well.   Past Medical History:  Diagnosis Date  . Abdominal pain, chronic, right lower quadrant 08/03/2013  . Abdominal pain, recurrent    With Headache  . Acute pyelonephritis 05/07/2016   kidney surgery at 73 months of age  . ADHD (attention deficit hyperactivity disorder)   . Allergy   . Anxiety   . Depression   . Endometriosis   . Family history of adverse reaction to anesthesia    "grandmother gets PONV"  . GE reflux 12/16/2011  . Hyperlipidemia   . Kidney disease 05/07/2016  . PCOS (polycystic ovarian syndrome)   . Preventative health care 11/04/2016  . Reflux, vesicoureteral   . Wrist pain, left 11/04/2016    Past Surgical History:   Procedure Laterality Date  . KIDNEY SURGERY     As a small child  . TYMPANOSTOMY TUBE PLACEMENT    . URETER SURGERY    . WRIST SURGERY Left 05/2016   10 pins and 2 plates    Family History  Problem Relation Age of Onset  . Kidney disease Mother   . Migraines Maternal Aunt   . Diabetes Maternal Grandfather   . Hyperlipidemia Other   . Hypertension Other   . Depression Other   . Alcohol abuse Father        and drug use, heroin, cocaine, marijuana  . Cancer Paternal Aunt        colon in 7s deceased    Social History   Socioeconomic History  . Marital status: Single    Spouse name: Not on file  . Number of children: Not on file  . Years of education: Not on file  . Highest education level: Not on file  Occupational History  . Not on file  Tobacco Use  . Smoking status: Current Every Day Smoker    Packs/day: 1.00    Years: 0.50    Pack years: 0.50    Types: Cigarettes    Last attempt to quit: 10/28/2015    Years since quitting: 4.1  . Smokeless tobacco: Former Neurosurgeon    Types: Chew  . Tobacco comment: Pt declines information  Substance and Sexual Activity  . Alcohol use: Not  Currently    Alcohol/week: 2.0 - 5.0 standard drinks    Types: 2 - 5 Cans of beer per week    Comment: 3-4 x week  . Drug use: Yes    Types: Marijuana  . Sexual activity: Not Currently    Birth control/protection: Implant  Other Topics Concern  . Not on file  Social History Narrative   Lives in boyfriend, completing High school   No dietary restrictions has a history of binge eating.. Former smoker, no alcohol or drug use.   Social Determinants of Health   Financial Resource Strain:   . Difficulty of Paying Living Expenses: Not on file  Food Insecurity:   . Worried About Programme researcher, broadcasting/film/video in the Last Year: Not on file  . Ran Out of Food in the Last Year: Not on file  Transportation Needs:   . Lack of Transportation (Medical): Not on file  . Lack of Transportation (Non-Medical): Not  on file  Physical Activity:   . Days of Exercise per Week: Not on file  . Minutes of Exercise per Session: Not on file  Stress:   . Feeling of Stress : Not on file  Social Connections:   . Frequency of Communication with Friends and Family: Not on file  . Frequency of Social Gatherings with Friends and Family: Not on file  . Attends Religious Services: Not on file  . Active Member of Clubs or Organizations: Not on file  . Attends Banker Meetings: Not on file  . Marital Status: Not on file  Intimate Partner Violence:   . Fear of Current or Ex-Partner: Not on file  . Emotionally Abused: Not on file  . Physically Abused: Not on file  . Sexually Abused: Not on file    Outpatient Medications Prior to Visit  Medication Sig Dispense Refill  . etonogestrel (NEXPLANON) 68 MG IMPL implant 1 each by Subdermal route once.    . prazosin (MINIPRESS) 1 MG capsule Take 2 capsules (2 mg total) by mouth at bedtime. 60 capsule 0  . QUEtiapine (SEROQUEL) 200 MG tablet Take 200 mg by mouth at bedtime.    Marland Kitchen QUEtiapine (SEROQUEL) 300 MG tablet Take 2 tablets (600 mg total) by mouth at bedtime. For mood control 60 tablet 0  . clonazePAM (KLONOPIN) 1 MG tablet Take 1 tablet (1 mg total) by mouth daily. For severe anxiety (Patient not taking: Reported on 09/08/2019) 10 tablet 0  . docusate sodium (COLACE) 100 MG capsule Take 1 capsule (100 mg total) by mouth daily as needed. (May buy from over the counter): For constipation (Patient not taking: Reported on 09/08/2019) 10 capsule 0  . doxycycline (VIBRA-TABS) 100 MG tablet Take 1 tablet (100 mg total) by mouth every 12 (twelve) hours. For infection (Patient not taking: Reported on 09/08/2019)    . FLUoxetine (PROZAC) 40 MG capsule Take 1 capsule (40 mg total) by mouth daily. For depression (Patient not taking: Reported on 09/08/2019) 30 capsule 0  . hydrOXYzine (ATARAX/VISTARIL) 50 MG tablet Take 1 tablet (50 mg total) by mouth 3 (three) times daily  as needed for anxiety. (Patient not taking: Reported on 09/08/2019) 60 tablet 0  . lamoTRIgine (LAMICTAL) 25 MG tablet Take 2 tablets (50 mg total) by mouth at bedtime. For mood stabilization (Patient not taking: Reported on 09/08/2019) 60 tablet 0  . metroNIDAZOLE (FLAGYL) 500 MG tablet Take 1 tablet (500 mg total) by mouth every 12 (twelve) hours. For infection (Patient not taking: Reported on  09/08/2019)    . mirtazapine (REMERON) 15 MG tablet Take 1 tablet (15 mg total) by mouth at bedtime. For depression/insomnia (Patient not taking: Reported on 09/08/2019) 30 tablet 0  . nicotine (NICODERM CQ - DOSED IN MG/24 HOURS) 14 mg/24hr patch Place 1 patch (14 mg total) onto the skin daily. (May purchase from over the counter): For smoking cessation (Patient not taking: Reported on 09/08/2019) 28 patch 0  . norgestimate-ethinyl estradiol (ORTHO-CYCLEN,SPRINTEC,PREVIFEM) 0.25-35 MG-MCG tablet Take 1 tablet by mouth daily. (Patient not taking: Reported on 09/08/2019) 1 Package 11   No facility-administered medications prior to visit.    Allergies  Allergen Reactions  . Sulfa Antibiotics Hives and Rash  . Sulfasalazine Hives  . Other     Fiberglass cast caused rash and hives  . Monosodium Glutamate Nausea And Vomiting, Rash and Other (See Comments)    Headache and dizziness  . Sulfa Drugs Cross Reactors Rash    Review of Systems  Constitutional: Positive for malaise/fatigue. Negative for fever.  HENT: Positive for congestion.   Eyes: Negative for blurred vision.  Respiratory: Positive for cough, sputum production and shortness of breath.   Cardiovascular: Negative for chest pain, palpitations and leg swelling.  Gastrointestinal: Positive for nausea and vomiting. Negative for abdominal pain and blood in stool.  Genitourinary: Negative for dysuria and frequency.  Musculoskeletal: Positive for myalgias. Negative for falls.  Skin: Negative for rash.  Neurological: Positive for headaches. Negative  for dizziness and loss of consciousness.  Endo/Heme/Allergies: Negative for environmental allergies.  Psychiatric/Behavioral: Positive for depression. The patient is nervous/anxious.       Objective:    Physical Exam unable to obtain via phone  Temp 97.8 F (36.6 C) (Temporal)   Wt (!) 310 lb 12.8 oz (141 kg)   BMI 48.68 kg/m  Wt Readings from Last 3 Encounters:  12/27/19 (!) 310 lb 12.8 oz (141 kg)  09/08/19 300 lb (136.1 kg)  08/01/19 257 lb 15 oz (117 kg)    Diabetic Foot Exam - Simple   No data filed     Lab Results  Component Value Date   WBC 11.0 (H) 08/01/2019   HGB 13.3 08/01/2019   HCT 39.9 08/01/2019   PLT 442 (H) 08/01/2019   GLUCOSE 103 (H) 08/01/2019   CHOL 258 (H) 09/26/2018   TRIG 69 09/26/2018   HDL 46 09/26/2018   LDLCALC 198 (H) 09/26/2018   ALT 14 08/01/2019   AST 24 08/01/2019   NA 140 08/01/2019   K 3.9 08/01/2019   CL 107 08/01/2019   CREATININE 0.81 08/01/2019   BUN 19 08/01/2019   CO2 25 08/01/2019   TSH 3.117 09/25/2018   HGBA1C 4.6 (L) 09/25/2018    Lab Results  Component Value Date   TSH 3.117 09/25/2018   Lab Results  Component Value Date   WBC 11.0 (H) 08/01/2019   HGB 13.3 08/01/2019   HCT 39.9 08/01/2019   MCV 89.3 08/01/2019   PLT 442 (H) 08/01/2019   Lab Results  Component Value Date   NA 140 08/01/2019   K 3.9 08/01/2019   CO2 25 08/01/2019   GLUCOSE 103 (H) 08/01/2019   BUN 19 08/01/2019   CREATININE 0.81 08/01/2019   BILITOT 0.2 (L) 08/01/2019   ALKPHOS 85 08/01/2019   AST 24 08/01/2019   ALT 14 08/01/2019   PROT 7.3 08/01/2019   ALBUMIN 4.0 08/01/2019   CALCIUM 9.0 08/01/2019   ANIONGAP 8 08/01/2019   GFR 109.45 08/19/2018   Lab  Results  Component Value Date   CHOL 258 (H) 09/26/2018   Lab Results  Component Value Date   HDL 46 09/26/2018   Lab Results  Component Value Date   LDLCALC 198 (H) 09/26/2018   Lab Results  Component Value Date   TRIG 69 09/26/2018   Lab Results  Component  Value Date   CHOLHDL 5.6 09/26/2018   Lab Results  Component Value Date   HGBA1C 4.6 (L) 09/25/2018       Assessment & Plan:   Problem List Items Addressed This Visit    Posttraumatic stress disorder    She has a counselor and she is asked to discuss strategies for desensitizing her to wearing a mask in the future.       Respiratory illness with fever    She started with loss of taste, congestion, cough, fatigue, myalgias, headache and nausea and vomiting yesterday, she was tested at [facility] at [place] for COVID and was negative yesterday. She is SOB and wheezing. Will prescribe Albuterol inhaler and Medrol dosepak and she is advised to quarantine as if she has COVID and retest in 5-7 days. Hydrate well and use some liquids with potassium in them if vomiting recurs. Omron Blood Pressure cuff, upper arm, want BP 100-140/60-90 Pulse oximeter, want oxygen in 90s  Weekly vitals  Take Multivitamin with minerals, selenium Vitamin D 1000-2000 IU daily Probiotic with lactobacillus and bifidophilus Asprin EC 81 mg daily  Melatonin 2-5 mg at bedtime  [email or web address]      RESOLVED: Upper respiratory virus   Tobacco use    She continues to smoke some and vape. She is reminded that this greatly increases her risk of severe illness if she contracts COVID. Strategies for stopping are discussed at length. She is noncommital         I have discontinued [facility] norgestimate-ethinyl estradiol, clonazePAM, doxycycline, FLUoxetine, hydrOXYzine, lamoTRIgine, mirtazapine, nicotine, docusate sodium, and metroNIDAZOLE. I am also having her maintain her QUEtiapine, prazosin, QUEtiapine, and etonogestrel.  No orders of the defined types were placed in this encounter.  The patient was provided an opportunity to ask questions and all were answered. The patient agreed with the plan and demonstrated an understanding of the instructions.   The patient was  advised to call back or seek an in-person evaluation if the symptoms worsen or if the condition fails to improve as anticipated.  I provided 25 minutes of non-face-to-face time during this encounter.   Danise Edge, MD

## 2019-12-28 NOTE — Assessment & Plan Note (Signed)
She has a Veterinary surgeon and she is asked to discuss strategies for desensitizing her to wearing a mask in the future.

## 2019-12-28 NOTE — Assessment & Plan Note (Signed)
She continues to smoke some and vape. She is reminded that this greatly increases her risk of severe illness if she contracts COVID. Strategies for stopping are discussed at length. She is noncommital

## 2019-12-29 ENCOUNTER — Encounter (HOSPITAL_BASED_OUTPATIENT_CLINIC_OR_DEPARTMENT_OTHER): Payer: Self-pay | Admitting: Emergency Medicine

## 2019-12-29 ENCOUNTER — Emergency Department (HOSPITAL_BASED_OUTPATIENT_CLINIC_OR_DEPARTMENT_OTHER): Payer: No Typology Code available for payment source

## 2019-12-29 ENCOUNTER — Other Ambulatory Visit: Payer: Self-pay

## 2019-12-29 ENCOUNTER — Emergency Department (HOSPITAL_BASED_OUTPATIENT_CLINIC_OR_DEPARTMENT_OTHER)
Admission: EM | Admit: 2019-12-29 | Discharge: 2019-12-29 | Disposition: A | Discharge status: Home / Self Care | Payer: No Typology Code available for payment source | Attending: Emergency Medicine | Admitting: Emergency Medicine

## 2019-12-29 ENCOUNTER — Telehealth: Payer: Self-pay | Admitting: Family Medicine

## 2019-12-29 DIAGNOSIS — R112 Nausea with vomiting, unspecified: Secondary | ICD-10-CM | POA: Diagnosis not present

## 2019-12-29 DIAGNOSIS — R42 Dizziness and giddiness: Secondary | ICD-10-CM | POA: Diagnosis not present

## 2019-12-29 DIAGNOSIS — R05 Cough: Secondary | ICD-10-CM | POA: Insufficient documentation

## 2019-12-29 DIAGNOSIS — Z79899 Other long term (current) drug therapy: Secondary | ICD-10-CM | POA: Diagnosis not present

## 2019-12-29 DIAGNOSIS — F17210 Nicotine dependence, cigarettes, uncomplicated: Secondary | ICD-10-CM | POA: Diagnosis not present

## 2019-12-29 DIAGNOSIS — R197 Diarrhea, unspecified: Secondary | ICD-10-CM | POA: Diagnosis not present

## 2019-12-29 DIAGNOSIS — Z793 Long term (current) use of hormonal contraceptives: Secondary | ICD-10-CM | POA: Insufficient documentation

## 2019-12-29 DIAGNOSIS — R519 Headache, unspecified: Secondary | ICD-10-CM | POA: Insufficient documentation

## 2019-12-29 DIAGNOSIS — Z20822 Contact with and (suspected) exposure to covid-19: Secondary | ICD-10-CM | POA: Insufficient documentation

## 2019-12-29 DIAGNOSIS — F909 Attention-deficit hyperactivity disorder, unspecified type: Secondary | ICD-10-CM | POA: Diagnosis not present

## 2019-12-29 DIAGNOSIS — R059 Cough, unspecified: Secondary | ICD-10-CM

## 2019-12-29 DIAGNOSIS — R432 Parageusia: Secondary | ICD-10-CM | POA: Insufficient documentation

## 2019-12-29 LAB — URINALYSIS, ROUTINE W REFLEX MICROSCOPIC
Bilirubin Urine: NEGATIVE
Glucose, UA: NEGATIVE mg/dL
Ketones, ur: NEGATIVE mg/dL
Leukocytes,Ua: NEGATIVE
Nitrite: NEGATIVE
Protein, ur: NEGATIVE mg/dL
Specific Gravity, Urine: >1.03 — ABNORMAL HIGH (ref 1.005–1.030)
pH: 6 (ref 5.0–8.0)

## 2019-12-29 LAB — CBC WITH DIFFERENTIAL/PLATELET
Abs Immature Granulocytes: 0.05 10*3/uL (ref 0.00–0.07)
Basophils Absolute: 0.1 10*3/uL (ref 0.0–0.1)
Basophils Relative: 0 %
Eosinophils Absolute: 0.2 10*3/uL (ref 0.0–0.5)
Eosinophils Relative: 1 %
HCT: 41.3 % (ref 36.0–46.0)
Hemoglobin: 13.7 g/dL (ref 12.0–15.0)
Immature Granulocytes: 0 %
Lymphocytes Relative: 23 %
Lymphs Abs: 3.5 10*3/uL (ref 0.7–4.0)
MCH: 29.8 pg (ref 26.0–34.0)
MCHC: 33.2 g/dL (ref 30.0–36.0)
MCV: 90 fL (ref 80.0–100.0)
Monocytes Absolute: 0.8 10*3/uL (ref 0.1–1.0)
Monocytes Relative: 5 %
Neutro Abs: 10.4 10*3/uL — ABNORMAL HIGH (ref 1.7–7.7)
Neutrophils Relative %: 71 %
Platelets: 408 10*3/uL — ABNORMAL HIGH (ref 150–400)
RBC: 4.59 MIL/uL (ref 3.87–5.11)
RDW: 13 % (ref 11.5–15.5)
WBC: 14.9 10*3/uL — ABNORMAL HIGH (ref 4.0–10.5)
nRBC: 0 % (ref 0.0–0.2)

## 2019-12-29 LAB — COMPREHENSIVE METABOLIC PANEL
ALT: 11 U/L (ref 0–44)
AST: 18 U/L (ref 15–41)
Albumin: 4.1 g/dL (ref 3.5–5.0)
Alkaline Phosphatase: 85 U/L (ref 38–126)
Anion gap: 9 (ref 5–15)
BUN: 12 mg/dL (ref 6–20)
CO2: 26 mmol/L (ref 22–32)
Calcium: 9.3 mg/dL (ref 8.9–10.3)
Chloride: 106 mmol/L (ref 98–111)
Creatinine, Ser: 0.81 mg/dL (ref 0.44–1.00)
GFR calc Af Amer: >60 mL/min (ref >60)
GFR calc non Af Amer: >60 mL/min (ref >60)
Glucose, Bld: 88 mg/dL (ref 70–99)
Potassium: 3.6 mmol/L (ref 3.5–5.1)
Sodium: 141 mmol/L (ref 135–145)
Total Bilirubin: 0.4 mg/dL (ref 0.3–1.2)
Total Protein: 7.2 g/dL (ref 6.5–8.1)

## 2019-12-29 LAB — URINALYSIS, MICROSCOPIC (REFLEX)

## 2019-12-29 LAB — SARS CORONAVIRUS 2 (TAT 6-24 HRS): SARS Coronavirus 2: NEGATIVE

## 2019-12-29 LAB — TROPONIN I (HIGH SENSITIVITY): Troponin I (High Sensitivity): <2 ng/L (ref <18)

## 2019-12-29 LAB — PREGNANCY, URINE: Preg Test, Ur: NEGATIVE

## 2019-12-29 LAB — D-DIMER, QUANTITATIVE: D-Dimer, Quant: <0.27 ug/mL-FEU (ref 0.00–0.50)

## 2019-12-29 MED ORDER — METOCLOPRAMIDE HCL 5 MG PO TABS: 5.0000 mg | ORAL_TABLET | Freq: Four times a day (QID) | ORAL | 0 refills | Status: DC

## 2019-12-29 MED ORDER — KETOROLAC TROMETHAMINE 15 MG/ML IJ SOLN
15.0000 mg | Freq: Once | INTRAMUSCULAR | Status: AC
  Administered 2019-12-29: 15 mg via INTRAVENOUS
  Filled 2019-12-29: qty 1

## 2019-12-29 MED ORDER — DIPHENHYDRAMINE HCL 50 MG/ML IJ SOLN
25.0000 mg | Freq: Once | INTRAMUSCULAR | Status: AC
  Administered 2019-12-29: 25 mg via INTRAVENOUS
  Filled 2019-12-29: qty 1

## 2019-12-29 MED ORDER — SODIUM CHLORIDE 0.9 % IV BOLUS
1000.0000 mL | Freq: Once | INTRAVENOUS | Status: AC
  Administered 2019-12-29: 1000 mL via INTRAVENOUS

## 2019-12-29 MED ORDER — PREDNISONE 20 MG PO TABS
40.0000 mg | ORAL_TABLET | Freq: Once | ORAL | Status: AC
  Administered 2019-12-29: 40 mg via ORAL
  Filled 2019-12-29: qty 2

## 2019-12-29 MED ORDER — PROCHLORPERAZINE EDISYLATE 10 MG/2ML IJ SOLN
10.0000 mg | Freq: Once | INTRAMUSCULAR | Status: AC
  Administered 2019-12-29: 10 mg via INTRAVENOUS
  Filled 2019-12-29: qty 2

## 2019-12-29 NOTE — ED Triage Notes (Addendum)
Cough, fever, headache, vomiting, loss of taste and smell x 4 days.

## 2019-12-29 NOTE — Telephone Encounter (Signed)
So when we spoke earlier in the week she was advised to test again for covid if symptoms persisted and I am also willing to treat. Send her in Azithromycin 250 mg tabs, 2 tabs po once then 1 tab po daily x 4 days and also take over the counter Mucinex twice a day and some probiotics daily. Encourage increased rest and hydration, add probiotics, zinc such as Coldeze or Xicam. Treat fevers as needed and if she continues to worsen then she will need to get looked at.

## 2019-12-29 NOTE — ED Provider Notes (Addendum)
[facility] EMERGENCY DEPARTMENT Provider Note   CSN: [id number] Arrival date & time: 12/29/19  9024     History Chief Complaint  Patient presents with  . COVID symptoms    Misty Jimenez is a 24 y.o. female.  HPI 24 year old Caucasian female past medical history significant for PCOS, endometriosis, anxiety, depression, who presents to the emergency department today for evaluation of headaches, body aches, cough, nausea, vomiting, diarrhea, dizziness, chest pain, shortness of breath.  Patient reports that she been feeling poorly over the past week.  She reports at that time she was having cough, body aches, diarrhea, vomiting.  Patient reports of the past several days she developed a headache, more specifically behind her right eye.  She reports some blurred vision to her right times the time.  Felt a little dizzy today.  Describes it as room spinning sensation that worsened with driving in the car.  She does report some presyncope tights episodes as well.  Patient reports overall chest pain for the past week.  It is anterior and nonradiating.  Not pleuritic or exertional in nature.  Patient has no cardiac history.  Denies associated diaphoresis with the chest pain.  Patient reports that the chest pain is the same at rest and with exertion.  Patient denies any history of PE/DVT, prolonged immobilization, recent hospitalization/surgeries, unilateral leg swelling or calf tenderness, hemoptysis.  Patient is not on any birth control.  She reports that she was seen by her primary care doctor's 3 days ago and had a rapid Covid test that was negative.  Patient states her symptoms have persisted and she called her primary care doctor who told her to come to the ER for further evaluation.  Patient has been taking prednisone, albuterol and Tessalon at home without any significant relief of symptoms.  She reports the cough is not productive he reports wheezing at times.  Does report some shortness  of breath but is worse with exertion.  Denies any known sick contacts.  No alleviating or aggravating factors.    Past Medical History:  Diagnosis Date  . Abdominal pain, chronic, right lower quadrant 08/03/2013  . Abdominal pain, recurrent    With Headache  . Acute pyelonephritis 05/07/2016   kidney surgery at 33 months of age  . ADHD (attention deficit hyperactivity disorder)   . Allergy   . Anxiety   . Depression   . Endometriosis   . Family history of adverse reaction to anesthesia    "grandmother gets PONV"  . GE reflux 12/16/2011  . Hyperlipidemia   . Kidney disease 05/07/2016  . PCOS (polycystic ovarian syndrome)   . Preventative health care 11/04/2016  . Reflux, vesicoureteral   . Wrist pain, left 11/04/2016    Patient Active Problem List   Diagnosis Date Noted  . Tobacco use 12/28/2019  . Respiratory illness with fever 12/27/2019  . Borderline personality disorder (HCC)   . Bipolar 2 disorder (HCC)   . MDD (major depressive disorder), recurrent, severe, with psychosis (HCC) 08/02/2019  . Posttraumatic stress disorder   . MDD (major depressive disorder), severe (HCC) 10/03/2018  . Severe recurrent major depression without psychotic features (HCC) 09/24/2018  . Midline back pain 09/12/2018  . Leukocytosis 08/22/2018  . Urinary urgency 08/22/2018  . Severe bipolar I disorder, most recent episode depressed (HCC) 08/22/2018  . Fatigue 08/17/2018  . MDD (major depressive disorder), recurrent severe, without psychosis (HCC)   . Wrist pain, left 11/04/2016  . Preventative health care 11/04/2016  .  Hyperlipidemia   . Obesity 05/07/2016  . Endometriosis 05/07/2016  . Kidney disease 05/07/2016  . PCOS (polycystic ovarian syndrome) 08/29/2013  . ADHD (attention deficit hyperactivity disorder) 08/03/2013  . 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 KIDNEY SURGERY     As a small child  .  TYMPANOSTOMY TUBE PLACEMENT    . URETER SURGERY    . WRIST SURGERY Left 05/2016   10 pins and 2 plates     OB History    Gravida  0   Para  0   Term  0   Preterm  0   AB  0   Living  0     SAB  0   TAB  0   Ectopic  0   Multiple  0   Live Births  0           Family History  Problem Relation Age of Onset  . Kidney disease Mother   . Migraines Maternal Aunt   . Diabetes Maternal Grandfather   . Hyperlipidemia Other   . Hypertension Other   . Depression Other   . Alcohol abuse Father        and drug use, heroin, cocaine, marijuana  . Cancer Paternal Aunt        colon in 55s deceased    Social History   Tobacco Use  . Smoking status: Current Every Day Smoker    Packs/day: 1.00    Years: 0.50    Pack years: 0.50    Types: Cigarettes    Last attempt to quit: 10/28/2015    Years since quitting: 4.1  . Smokeless tobacco: Former Systems developer    Types: Chew  . Tobacco comment: Pt declines information  Substance Use Topics  . Alcohol use: Not Currently    Alcohol/week: 2.0 - 5.0 standard drinks    Types: 2 - 5 Cans of beer per week    Comment: 3-4 x week  . Drug use: Yes    Types: Marijuana    Home Medications Prior to Admission medications   Medication Sig Start Date End Date Taking? Authorizing Provider  albuterol (VENTOLIN HFA) 108 (90 Base) MCG/ACT inhaler Inhale 1-2 puffs into the lungs every 6 (six) hours as needed for wheezing or shortness of breath. 12/27/19   Mosie Lukes, MD  benzonatate (TESSALON) 100 MG capsule Take 1 capsule (100 mg total) by mouth 3 (three) times daily as needed for cough. 12/27/19   Mosie Lukes, MD  etonogestrel (NEXPLANON) 68 MG IMPL implant 1 each by Subdermal route once.    [provider]  methylPREDNISolone (MEDROL) 4 MG tablet 5 tab po qd X 1d then 4 tab po qd X 1d then 3 tab po qd X 1d then 2 tab po qd then 1 tab po qd 12/27/19   Mosie Lukes, MD  metoCLOPramide (REGLAN) 5 MG tablet Take 1 tablet (5 mg total)  by mouth every 6 (six) hours. 12/29/19   Doristine Devoid, PA-C  prazosin (MINIPRESS) 1 MG capsule Take 2 capsules (2 mg total) by mouth at bedtime. 08/05/19   Lindell Spar I, NP  QUEtiapine (SEROQUEL) 200 MG tablet Take 200 mg by mouth at bedtime.    [provider]  QUEtiapine (SEROQUEL) 300 MG tablet Take 2 tablets (600 mg total) by mouth at bedtime. For mood control 08/05/19   Encarnacion Slates, NP  Allergies    Sulfa antibiotics, Sulfasalazine, Other, Monosodium glutamate, and Sulfa drugs cross reactors  Review of Systems   Review of Systems  Constitutional: Positive for chills, fatigue and fever.  HENT: Positive for congestion, ear pain, rhinorrhea and sore throat.   Eyes: Positive for photophobia and visual disturbance. Negative for pain, discharge and itching.  Respiratory: Positive for cough and shortness of breath.   Cardiovascular: Positive for chest pain.  Gastrointestinal: Positive for diarrhea, nausea and vomiting. Negative for abdominal pain.  Genitourinary: Negative for dysuria, frequency, hematuria and urgency.  Musculoskeletal: Positive for arthralgias and myalgias.  Skin: Negative for color change.  Neurological: Positive for dizziness, weakness, light-headedness and headaches. Negative for syncope.  Psychiatric/Behavioral: Negative for confusion.    Physical Exam Updated Vital Signs BP 123/77   Pulse 66   Temp 98.2 F (36.8 C) (Oral)   Resp 13   Ht 5\' 7"  (1.702 m)   Wt (!) 140.6 kg   SpO2 96%   BMI 48.55 kg/m   Physical Exam Vitals and nursing note reviewed.  Constitutional:      General: She is not in acute distress.    Appearance: She is well-developed. She is not toxic-appearing.  HENT:     Head: Normocephalic and atraumatic.     Nose: Nose normal.     Mouth/Throat:     Mouth: Mucous membranes are moist.     Pharynx: Oropharynx is clear. No oropharyngeal exudate or posterior oropharyngeal erythema.  Eyes:     General:        Right eye:  No discharge.        Left eye: No discharge.     Conjunctiva/sclera: Conjunctivae normal.     Pupils: Pupils are equal, round, and reactive to light.  Cardiovascular:     Rate and Rhythm: Normal rate and regular rhythm.     Heart sounds: Normal heart sounds. No murmur. No friction rub. No gallop.   Pulmonary:     Effort: Pulmonary effort is normal. No respiratory distress.     Breath sounds: Normal breath sounds. No stridor. No wheezing, rhonchi or rales.  Chest:     Chest wall: No tenderness.  Abdominal:     General: Abdomen is flat. Bowel sounds are normal.     Palpations: Abdomen is soft.     Tenderness: There is no abdominal tenderness. There is no right CVA tenderness, left CVA tenderness, guarding or rebound.  Musculoskeletal:        General: No tenderness. Normal range of motion.     Cervical back: Normal range of motion and neck supple. No rigidity.  Lymphadenopathy:     Cervical: No cervical adenopathy.  Skin:    General: Skin is warm and dry.     Capillary Refill: Capillary refill takes less than 2 seconds.  Neurological:     Mental Status: She is alert and oriented to person, place, and time.     Comments: The patient is alert, attentive, and oriented x 3. Speech is clear. Cranial nerve II-VII grossly intact. Negative pronator drift. Sensation intact. Strength 5/5 in all extremities. Reflexes 2+ and symmetric at biceps, triceps, knees, and ankles. Rapid alternating movement and fine finger movements intact.Posture and gait normal.   Psychiatric:        Behavior: Behavior normal.        Thought Content: Thought content normal.        Judgment: Judgment normal.     ED Results / Procedures / Treatments  Labs (all labs ordered are listed, but only abnormal results are displayed) Labs Reviewed  CBC WITH DIFFERENTIAL/PLATELET - Abnormal; Notable for the following components:      Result Value   WBC 14.9 (*)    Platelets 408 (*)    Neutro Abs 10.4 (*)    All other  components within normal limits  URINALYSIS, ROUTINE W REFLEX MICROSCOPIC - Abnormal; Notable for the following components:   APPearance HAZY (*)    Specific Gravity, Urine >1.030 (*)    Hgb urine dipstick SMALL (*)    All other components within normal limits  URINALYSIS, MICROSCOPIC (REFLEX) - Abnormal; Notable for the following components:   Bacteria, UA MANY (*)    All other components within normal limits  SARS CORONAVIRUS 2 (TAT 6-24 HRS)  PREGNANCY, URINE  COMPREHENSIVE METABOLIC PANEL  D-DIMER, QUANTITATIVE (NOT AT [facility])  TROPONIN I (HIGH SENSITIVITY)    EKG EKG Interpretation  Date/Time:  Thursday December 29 2019 11:27:04 EST Ventricular Rate:  72 PR Interval:    QRS Duration: 81 QT Interval:  399 QTC Calculation: 437 R Axis:   53 Text Interpretation: Sinus rhythm No significant change since last tracing Confirmed by Frederick Peers [phone]) on 12/29/2019 1:03:19 PM   Radiology CT Head Wo Contrast  Result Date: 12/29/2019 CLINICAL DATA:  Headache EXAM: CT HEAD WITHOUT CONTRAST TECHNIQUE: Contiguous axial images were obtained from the base of the skull through the vertex without intravenous contrast. COMPARISON:  03/05/2015 FINDINGS: Brain: There is no acute intracranial hemorrhage, mass-effect, or edema. Gray-white differentiation is preserved. There is no extra-axial fluid collection. Ventricles and sulci are within normal limits in size and configuration. Vascular: No hyperdense vessel or unexpected calcification. Skull: Calvarium is unremarkable. Sinuses/Orbits: No acute finding. Other: None. IMPRESSION: No acute intracranial abnormality. Electronically Signed   By: Guadlupe Spanish M.D.   On: 12/29/2019 12:23   DG Chest Portable 1 View  Result Date: 12/29/2019 CLINICAL DATA:  Cough, shortness of breath. EXAM: PORTABLE CHEST 1 VIEW COMPARISON:  None. FINDINGS: The heart size and mediastinal contours are within normal limits. Both lungs are clear. No pneumothorax or pleural  effusion is noted. The visualized skeletal structures are unremarkable. IMPRESSION: No active disease. Electronically Signed   By: Lupita Raider M.D.   On: 12/29/2019 10:36    Procedures Procedures (including critical care time)  Medications Ordered in ED Medications  sodium chloride 0.9 % bolus 1,000 mL (0 mLs Intravenous Stopped 12/29/19 1144)  prochlorperazine (COMPAZINE) injection 10 mg (10 mg Intravenous Given 12/29/19 1040)  diphenhydrAMINE (BENADRYL) injection 25 mg (25 mg Intravenous Given 12/29/19 1039)  predniSONE (DELTASONE) tablet 40 mg (40 mg Oral Given 12/29/19 1048)  ketorolac (TORADOL) 15 MG/ML injection 15 mg (15 mg Intravenous Given 12/29/19 1145)    ED Course  I have reviewed the triage vital signs and the nursing notes.  Pertinent labs & imaging results that were available during my care of the patient were reviewed by me and considered in my medical decision making (see chart for details).    MDM Rules/Calculators/A&P                      24 year old female presents the ER for COVID-19 symptoms.  Patient states she had a rapid test that was -3 days ago however her symptoms have persisted.  Patient was placed on albuterol, steroids and Tessalon by her primary care doctor.  Symptoms have worsened.  She was associated headache with some vision change  at this time.  Patient also reports having chest pain or shortness of breath.  On exam patient is very well-appearing and nontoxic.  Vital signs reassuring.  Patient is afebrile.  No hypotension appreciated.  Patient is not hypoxic or tachypneic.  No focal neurological deficit.  No nuchal rigidity.  Lungs clear to auscultation.  Heart regular rate and rhythm.  Neurovascular intact in lower extremity.  No focal abdominal pain to palpation.  Labs show a leukocytosis consistent with steroid use.  No other significant abnormality appreciated.  There is no electrolyte abnormality.  Patient troponin was negative.  UA showed no signs of  infection.  Normal D-dimer.  COVID-19 test is negative.  EKG performed and reviewed by myself shows sinus rhythm with normal sinus rhythm with a heart rate of 72 bpm with normal PR QRS and QT interval any prolonged QT.  Chest x-ray which I reviewed shows no evidence of cardiac or pulmonary abnormality.  Specifically no signs of pneumonia, pneumothorax.  Given patient's vague headache, dizziness and vision changes CT scan was performed that showed no acute intracranial abnormality.  Patient's visual acuity was at her baseline bilaterally.  Patient able to ambulate without any ataxia.  Doubt acute CVA, ICH.  Patient given migraine cocktail in the ER.  Symptoms have significantly improved.  Patient denies any further dizziness or vision changes.  She states that her pain is improved as well.  I suspect this is likely a viral illness.  COVID-19 is a possibility given her symptoms.  Patient does report loss of taste and smell as well.  I doubt PE given the D-dimer was negative.  Doubt ACS, dissection, myocarditis, pericarditis or endocarditis.  Patient will need to continue symptomatic treatment at home.  Discussed that if her symptoms worsen she should return to the ER.  Pt is hemodynamically stable, in NAD, & able to ambulate in the ED. Evaluation does not show pathology that would require ongoing emergent intervention or inpatient treatment. I explained the diagnosis to the patient. Pain has been managed & has no complaints prior to dc. Pt is comfortable with above plan and is stable for discharge at this time. All questions were answered prior to disposition. Strict return precautions for f/u to the ED were discussed. Encouraged follow up with PCP.  Case dicussed with my attending Dr. Clarene Duke who is agreeable with the above plan.  Final Clinical Impression(s) / ED Diagnoses Final diagnoses:  Cough  Nonintractable headache, unspecified chronicity pattern, unspecified headache type  Nausea vomiting and diarrhea   Loss of taste  Dizziness    Rx / DC Orders ED Discharge Orders         Ordered    metoCLOPramide (REGLAN) 5 MG tablet  Every 6 hours     12/29/19 1304           Rise Mu, PA-C 12/29/19 1709    Rise Mu, PA-C 12/29/19 1710    Little, Ambrose Finland, MD 12/30/19 1341

## 2019-12-29 NOTE — Telephone Encounter (Signed)
Please advise 

## 2019-12-29 NOTE — Discharge Instructions (Signed)
Your work-up has been reassuring in the emergency department today.  Your COVID-19 test is still pending.  I would recommend taking Motrin and Tylenol at home for pain.  I will give you Reglan for any nausea or vomiting.  If your dizziness, blurry vision or headache worsen return to the ER.  Make sure that you follow-up your test results.  Continue to quarantine for 10 days if test results are positive.  Follow with primary care doctor within 1 week.

## 2019-12-29 NOTE — Telephone Encounter (Signed)
Patient states that she has had a fever,dizziness weakness, body aches for week, PCP Abner Greenspan   Please Advise .  Call Back # [phone]

## 2020-01-03 NOTE — Telephone Encounter (Signed)
Called patient left voicemail for patient to call the office back  

## 2020-03-17 IMAGING — CT CT HEAD W/O CM
3 series · 16 of 47 positions shown, 19 images · non-contrast
Comparison: 03/05/2015

CLINICAL DATA: Headache

EXAM:
CT HEAD WITHOUT CONTRAST
TECHNIQUE: Contiguous axial images were obtained from the base of the skull
through the vertex without intravenous contrast.

[Series 2: head wo · axial · 0.43mm/px · z∈[-150,-15]mm · 10 of 33 slices shown, 13 images]
[im 3/33  brain]
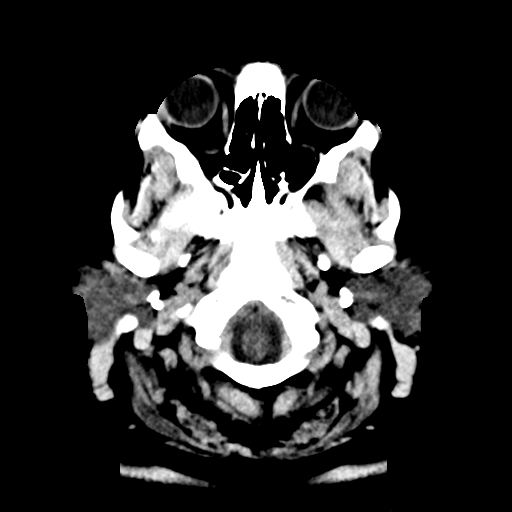
[im 3/33  bone]
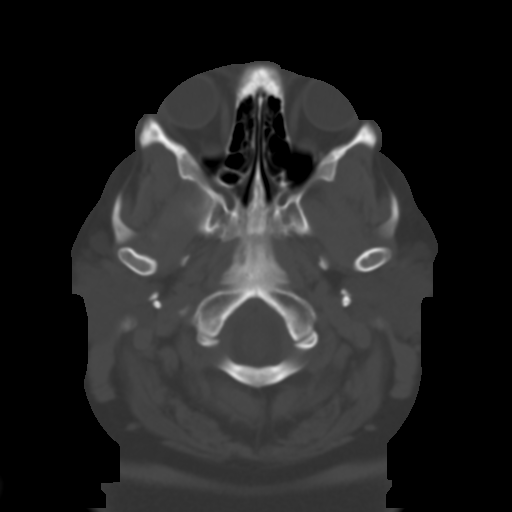
[im 6/33  brain]
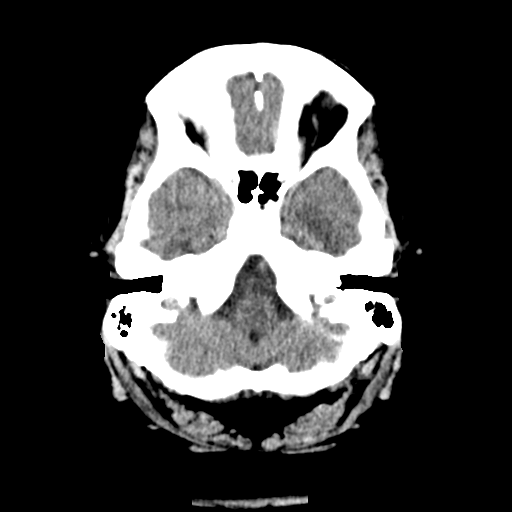
[im 9/33  brain]
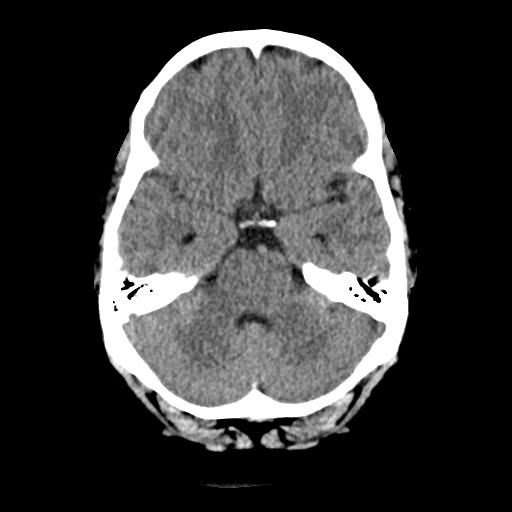
[im 12/33  brain]
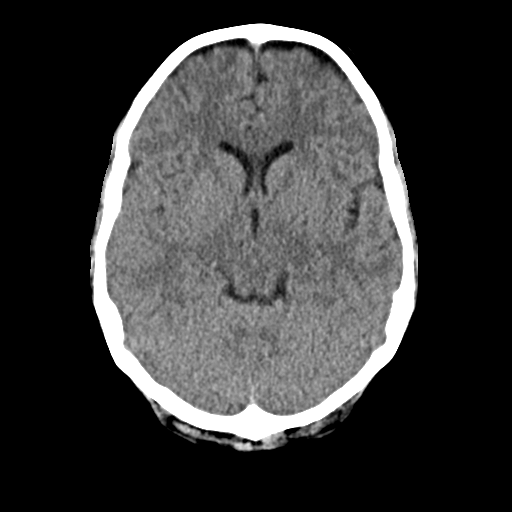
[im 15/33  brain]
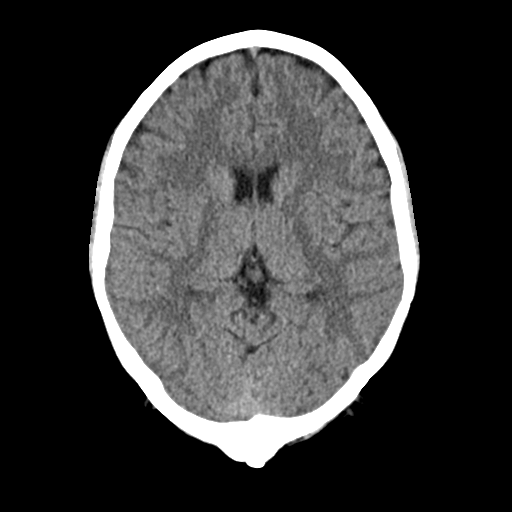
[im 15/33  bone]
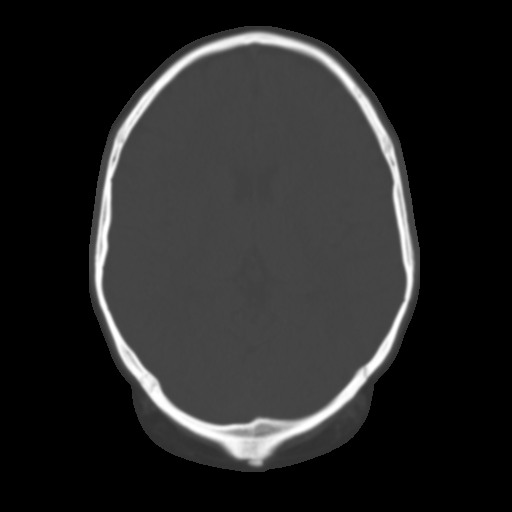
[im 18/33  brain]
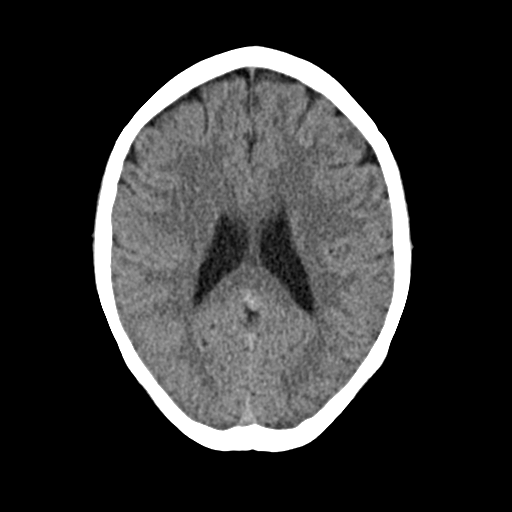
[im 21/33  brain]
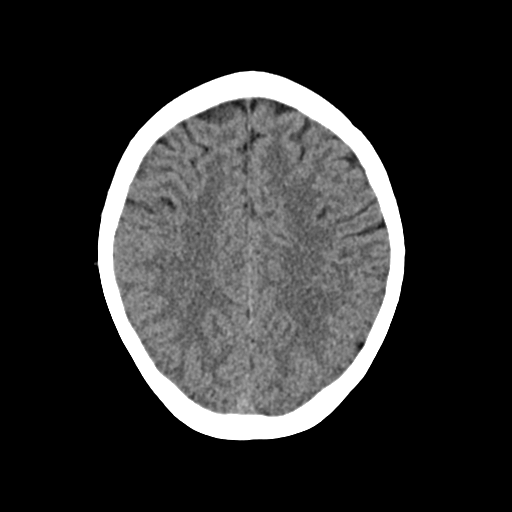
[im 25/33  brain]
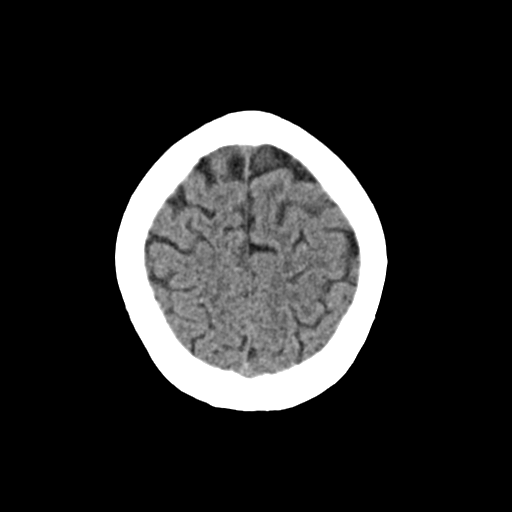
[im 27/33  brain]
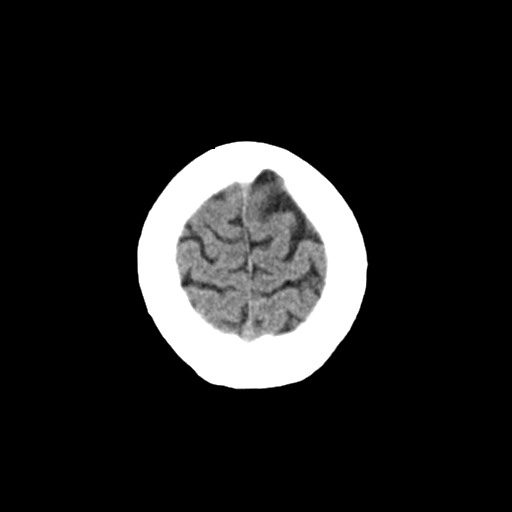
[im 27/33  bone]
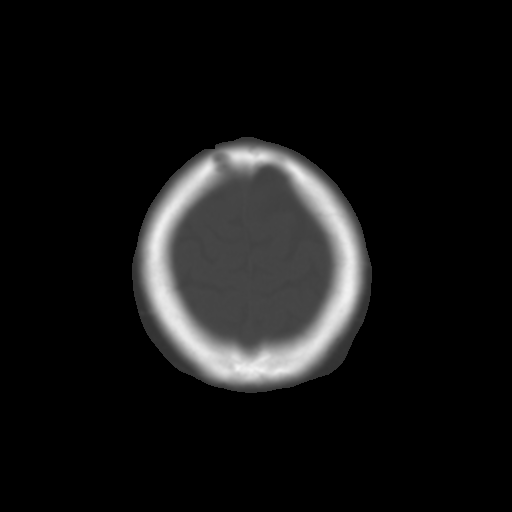
[im 30/33  brain]
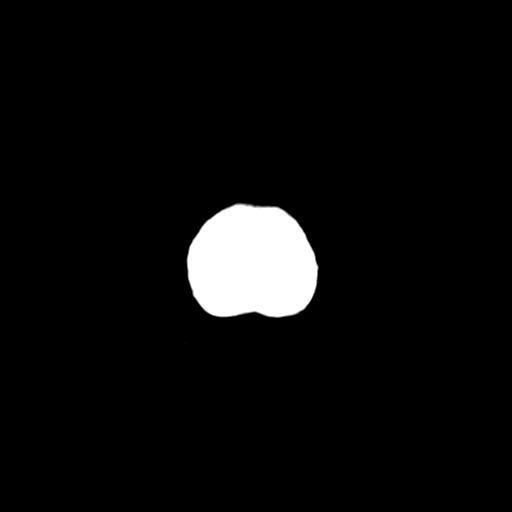

[Series 4: coronal soft · coronal · 0.33mm/px · 3 of 71 slices shown]
[im 24/71  brain]
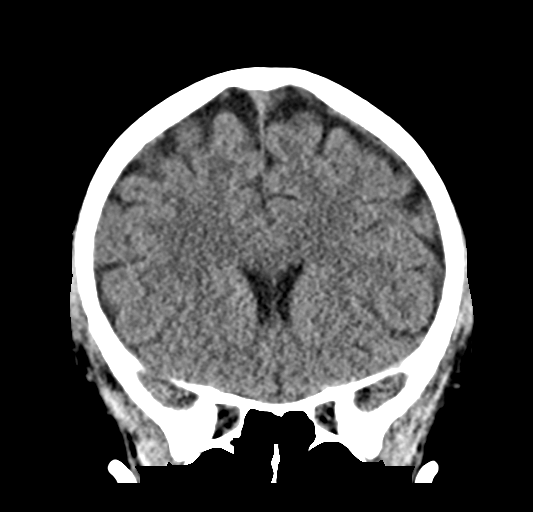
[im 32/71  brain]
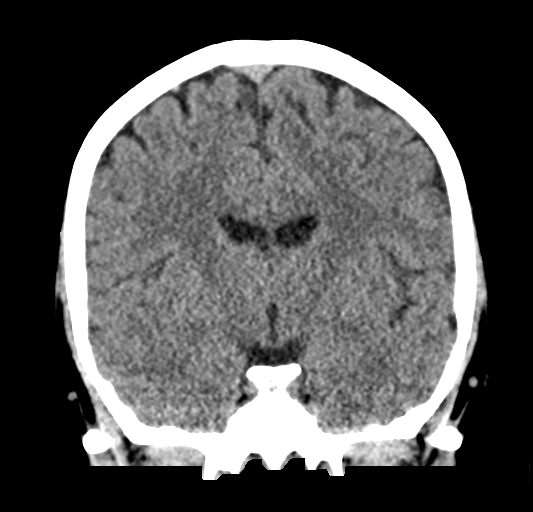
[im 39/71  brain]
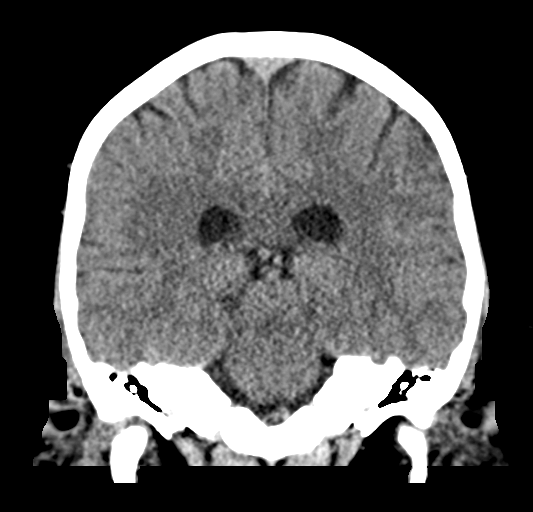

[Series 5: sag soft · sagittal · 0.34mm/px · 3 of 54 slices shown]
[im 18/54  brain]
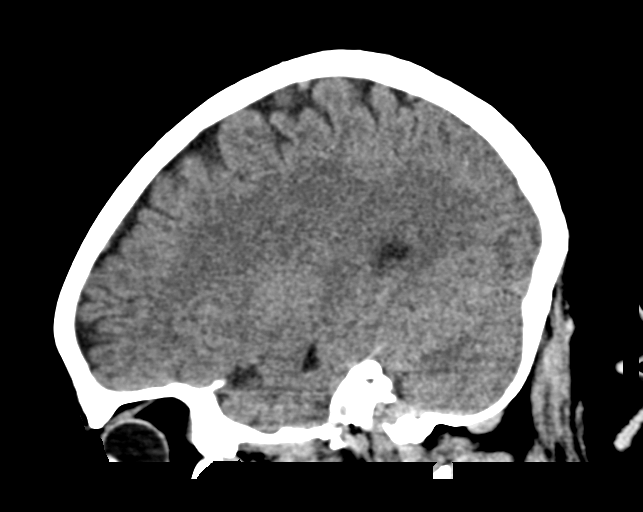
[im 27/54  brain]
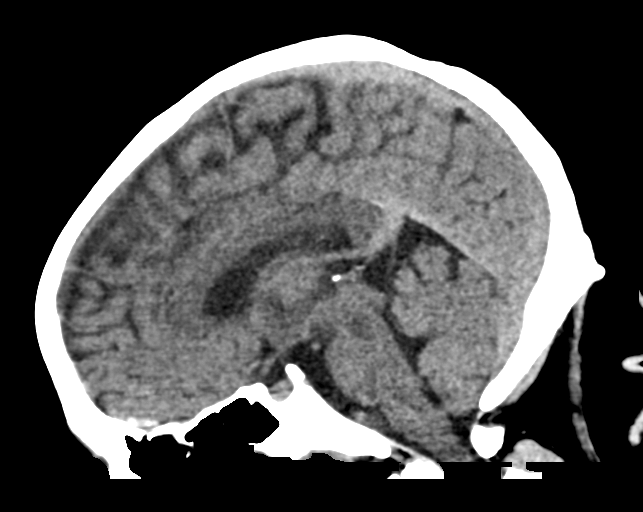
[im 36/54  brain]
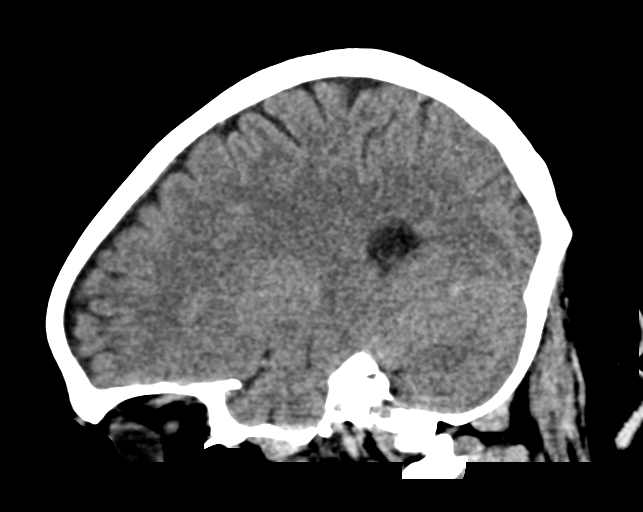

[16 of 47 positions shown; findings below may reference images not displayed]

FINDINGS: Brain: There is no acute intracranial hemorrhage, mass-effect, or
edema. Gray-white differentiation is preserved. There is no
extra-axial fluid collection. Ventricles and sulci are within normal
limits in size and configuration.

Vascular: No hyperdense vessel or unexpected calcification.

Skull: Calvarium is unremarkable.

Sinuses/Orbits: No acute finding.

Other: None.
IMPRESSION: No acute intracranial abnormality.

## 2020-05-14 ENCOUNTER — Encounter: Payer: No Typology Code available for payment source | Admitting: Family Medicine

## 2020-10-12 DIAGNOSIS — F12188 Cannabis abuse with other cannabis-induced disorder: Secondary | ICD-10-CM | POA: Insufficient documentation

## 2020-10-12 HISTORY — DX: Cannabis abuse with other cannabis-induced disorder: F12.188

## 2020-11-15 DIAGNOSIS — F3181 Bipolar II disorder: Secondary | ICD-10-CM | POA: Insufficient documentation

## 2021-02-28 DIAGNOSIS — F312 Bipolar disorder, current episode manic severe with psychotic features: Secondary | ICD-10-CM | POA: Insufficient documentation

## 2021-02-28 HISTORY — DX: Bipolar disorder, current episode manic severe with psychotic features: F31.2

## 2021-12-18 DIAGNOSIS — N611 Abscess of the breast and nipple: Secondary | ICD-10-CM | POA: Insufficient documentation

## 2021-12-18 HISTORY — DX: Abscess of the breast and nipple: N61.1

## 2022-09-29 ENCOUNTER — Telehealth: Payer: Self-pay

## 2022-09-29 NOTE — Telephone Encounter (Signed)
Transition Care Management Unsuccessful Follow-up Telephone Call  Date of discharge and from where:  [facility] 09/27/2022  Attempts:  1st Attempt  Reason for unsuccessful TCM follow-up call:  Missing or invalid number number not in service Karena Addison, LPN [facility] Nurse Health Advisor Direct Dial [phone]

## 2022-09-30 NOTE — Telephone Encounter (Signed)
Patient is deceased.

## 2022-09-30 NOTE — Telephone Encounter (Signed)
Transition Care Management Unsuccessful Follow-up Telephone Call  Date of discharge and from where:  [facility] 09/29/2022  Attempts:  2nd Attempt  Reason for unsuccessful TCM follow-up call:  Unable to reach patient All numbers for patient not in service Karena Addison, LPN [facility] Nurse Health Advisor Direct Dial [phone]

## 2022-10-27 NOTE — L&D Delivery Note (Signed)
OB/GYN Faculty Practice Delivery Note  Misty Jimenez is a 27 y.o. G1P0000 s/p SVD at [redacted]w[redacted]d. She was admitted for IOL FGR.   ROM: 9h 86m with clear fluid GBS Status:  Positive/-- (10/26 0000) Maximum Maternal Temperature:  Temp (24hrs), Avg:98.1 F (36.7 C), Min:97.7 F (36.5 C), Max:98.4 F (36.9 C)    Labor Progress: Patient arrived at 2 cm dilation and was induced with cytotec, AROM, pitocin .   Delivery Date/Time: 09/02/2023 at [phone] Delivery: Called to room and patient was complete and pushing. Head delivered in LOA position. Nuchal cord present and reduced immediately . Shoulder and body delivered in usual fashion. Infant with spontaneous cry, placed on mother's abdomen, dried and stimulated. Cord clamped x 2 after 1-minute delay, and cut by FOB. Cord blood drawn. Placenta delivered spontaneously with gentle cord traction. Fundus firm with massage and Pitocin. Labia, perineum, vagina, and cervix inspected with right labial tear repaired with 4-0 vicryl suture.   Placenta:  spontaneous, intact, 3 vessel cord  Complications: None Lacerations: right labial, repaired EBL: 44 mL Analgesia: Epidural    Infant: APGAR (1 MIN): 8  APGAR (5 MINS): 9  APGAR (10 MINS):    Weight: pending   Derrel Nip, MD Attending Family Medicine Physician, [facility], [facility]  09/02/2023 8:58 AM

## 2022-11-19 ENCOUNTER — Encounter (HOSPITAL_BASED_OUTPATIENT_CLINIC_OR_DEPARTMENT_OTHER): Payer: Self-pay

## 2022-11-19 ENCOUNTER — Emergency Department (HOSPITAL_BASED_OUTPATIENT_CLINIC_OR_DEPARTMENT_OTHER): Payer: 59

## 2022-11-19 ENCOUNTER — Emergency Department (HOSPITAL_BASED_OUTPATIENT_CLINIC_OR_DEPARTMENT_OTHER)
Admission: EM | Admit: 2022-11-19 | Discharge: 2022-11-19 | Disposition: A | Discharge status: Home / Self Care | Payer: 59 | Attending: Emergency Medicine | Admitting: Emergency Medicine

## 2022-11-19 ENCOUNTER — Other Ambulatory Visit: Payer: Self-pay

## 2022-11-19 DIAGNOSIS — M25531 Pain in right wrist: Secondary | ICD-10-CM | POA: Insufficient documentation

## 2022-11-19 DIAGNOSIS — D72829 Elevated white blood cell count, unspecified: Secondary | ICD-10-CM | POA: Diagnosis not present

## 2022-11-19 DIAGNOSIS — R1084 Generalized abdominal pain: Secondary | ICD-10-CM | POA: Diagnosis present

## 2022-11-19 LAB — COMPREHENSIVE METABOLIC PANEL
ALT: 15 U/L (ref 0–44)
AST: 34 U/L (ref 15–41)
Albumin: 4.1 g/dL (ref 3.5–5.0)
Alkaline Phosphatase: 78 U/L (ref 38–126)
Anion gap: 9 (ref 5–15)
BUN: 16 mg/dL (ref 6–20)
CO2: 22 mmol/L (ref 22–32)
Calcium: 8.7 mg/dL — ABNORMAL LOW (ref 8.9–10.3)
Chloride: 105 mmol/L (ref 98–111)
Creatinine, Ser: 0.8 mg/dL (ref 0.44–1.00)
GFR, Estimated: >60 mL/min (ref >60)
Glucose, Bld: 85 mg/dL (ref 70–99)
Potassium: 3.6 mmol/L (ref 3.5–5.1)
Sodium: 136 mmol/L (ref 135–145)
Total Bilirubin: 0.7 mg/dL (ref 0.3–1.2)
Total Protein: 7.4 g/dL (ref 6.5–8.1)

## 2022-11-19 LAB — URINALYSIS, MICROSCOPIC (REFLEX)

## 2022-11-19 LAB — URINALYSIS, ROUTINE W REFLEX MICROSCOPIC
Glucose, UA: NEGATIVE mg/dL
Hgb urine dipstick: NEGATIVE
Ketones, ur: NEGATIVE mg/dL
Leukocytes,Ua: NEGATIVE
Nitrite: NEGATIVE
Protein, ur: 30 mg/dL — AB
Specific Gravity, Urine: >=1.03 (ref 1.005–1.030)
pH: 5.5 (ref 5.0–8.0)

## 2022-11-19 LAB — CBC WITH DIFFERENTIAL/PLATELET
Abs Immature Granulocytes: 0.03 10*3/uL (ref 0.00–0.07)
Basophils Absolute: 0.1 10*3/uL (ref 0.0–0.1)
Basophils Relative: 1 %
Eosinophils Absolute: 0.1 10*3/uL (ref 0.0–0.5)
Eosinophils Relative: 1 %
HCT: 38.2 % (ref 36.0–46.0)
Hemoglobin: 13 g/dL (ref 12.0–15.0)
Immature Granulocytes: 0 %
Lymphocytes Relative: 27 %
Lymphs Abs: 3.1 10*3/uL (ref 0.7–4.0)
MCH: 30.4 pg (ref 26.0–34.0)
MCHC: 34 g/dL (ref 30.0–36.0)
MCV: 89.5 fL (ref 80.0–100.0)
Monocytes Absolute: 0.9 10*3/uL (ref 0.1–1.0)
Monocytes Relative: 8 %
Neutro Abs: 7.1 10*3/uL (ref 1.7–7.7)
Neutrophils Relative %: 63 %
Platelets: 333 10*3/uL (ref 150–400)
RBC: 4.27 MIL/uL (ref 3.87–5.11)
RDW: 12.6 % (ref 11.5–15.5)
WBC: 11.3 10*3/uL — ABNORMAL HIGH (ref 4.0–10.5)
nRBC: 0 % (ref 0.0–0.2)

## 2022-11-19 LAB — LIPASE, BLOOD: Lipase: 29 U/L (ref 11–51)

## 2022-11-19 LAB — PREGNANCY, URINE: Preg Test, Ur: NEGATIVE

## 2022-11-19 NOTE — ED Notes (Signed)
Patient was asked multiple time to wait in room after leaving and walking into provider area. Patient and husband became verbally aggressive with staff. Patient decided to leave without vitals or discharge paperwork after cussing out this nurse.

## 2022-11-19 NOTE — Discharge Instructions (Signed)
Take 4 over the counter ibuprofen tablets 3 times a day or 2 over-the-counter naproxen tablets twice a day for pain. Also take tylenol 1000mg (2 extra strength) four times a day.    Please follow-up with your family doctor in the office.  Please follow-up with OB/GYN for your polycystic ovarian syndrome.  Follow-up with a mental health provider for your mental health.

## 2022-11-19 NOTE — ED Notes (Signed)
Pt in room, calm, cooperative, resting quietly, in to obtain blood and urine spec. Large bluish discoloration areas noted on both rt and left arms/ hands. Scratch marks noted on Rt forearm

## 2022-11-19 NOTE — ED Notes (Signed)
ED Provider at bedside. 
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
## 2023-03-07 ENCOUNTER — Emergency Department (HOSPITAL_BASED_OUTPATIENT_CLINIC_OR_DEPARTMENT_OTHER): Payer: 59

## 2023-03-07 ENCOUNTER — Other Ambulatory Visit: Payer: Self-pay

## 2023-03-07 ENCOUNTER — Emergency Department (HOSPITAL_BASED_OUTPATIENT_CLINIC_OR_DEPARTMENT_OTHER)
Admission: EM | Admit: 2023-03-07 | Discharge: 2023-03-07 | Disposition: A | Discharge status: Home / Self Care | Payer: 59 | Attending: Emergency Medicine | Admitting: Emergency Medicine

## 2023-03-07 ENCOUNTER — Telehealth (HOSPITAL_BASED_OUTPATIENT_CLINIC_OR_DEPARTMENT_OTHER): Payer: Self-pay | Admitting: Emergency Medicine

## 2023-03-07 ENCOUNTER — Encounter (HOSPITAL_BASED_OUTPATIENT_CLINIC_OR_DEPARTMENT_OTHER): Payer: Self-pay | Admitting: Emergency Medicine

## 2023-03-07 DIAGNOSIS — Z3A13 13 weeks gestation of pregnancy: Secondary | ICD-10-CM | POA: Diagnosis not present

## 2023-03-07 DIAGNOSIS — O219 Vomiting of pregnancy, unspecified: Secondary | ICD-10-CM | POA: Diagnosis present

## 2023-03-07 DIAGNOSIS — O210 Mild hyperemesis gravidarum: Secondary | ICD-10-CM | POA: Diagnosis not present

## 2023-03-07 DIAGNOSIS — R8271 Bacteriuria: Secondary | ICD-10-CM | POA: Diagnosis not present

## 2023-03-07 DIAGNOSIS — O2391 Unspecified genitourinary tract infection in pregnancy, first trimester: Secondary | ICD-10-CM | POA: Insufficient documentation

## 2023-03-07 DIAGNOSIS — R1030 Lower abdominal pain, unspecified: Secondary | ICD-10-CM | POA: Diagnosis not present

## 2023-03-07 DIAGNOSIS — O99111 Other diseases of the blood and blood-forming organs and certain disorders involving the immune mechanism complicating pregnancy, first trimester: Secondary | ICD-10-CM | POA: Insufficient documentation

## 2023-03-07 DIAGNOSIS — O26891 Other specified pregnancy related conditions, first trimester: Secondary | ICD-10-CM | POA: Insufficient documentation

## 2023-03-07 DIAGNOSIS — D72829 Elevated white blood cell count, unspecified: Secondary | ICD-10-CM | POA: Diagnosis not present

## 2023-03-07 LAB — COMPREHENSIVE METABOLIC PANEL
ALT: 8 U/L (ref 0–44)
AST: 15 U/L (ref 15–41)
Albumin: 3.3 g/dL — ABNORMAL LOW (ref 3.5–5.0)
Alkaline Phosphatase: 50 U/L (ref 38–126)
Anion gap: 8 (ref 5–15)
BUN: 9 mg/dL (ref 6–20)
CO2: 21 mmol/L — ABNORMAL LOW (ref 22–32)
Calcium: 8.7 mg/dL — ABNORMAL LOW (ref 8.9–10.3)
Chloride: 105 mmol/L (ref 98–111)
Creatinine, Ser: 0.59 mg/dL (ref 0.44–1.00)
GFR, Estimated: >60 mL/min (ref >60)
Glucose, Bld: 82 mg/dL (ref 70–99)
Potassium: 3.7 mmol/L (ref 3.5–5.1)
Sodium: 134 mmol/L — ABNORMAL LOW (ref 135–145)
Total Bilirubin: 0.4 mg/dL (ref 0.3–1.2)
Total Protein: 6.4 g/dL — ABNORMAL LOW (ref 6.5–8.1)

## 2023-03-07 LAB — CBC WITH DIFFERENTIAL/PLATELET
Abs Immature Granulocytes: 0.04 10*3/uL (ref 0.00–0.07)
Basophils Absolute: 0.1 10*3/uL (ref 0.0–0.1)
Basophils Relative: 1 %
Eosinophils Absolute: 0.1 10*3/uL (ref 0.0–0.5)
Eosinophils Relative: 1 %
HCT: 37.3 % (ref 36.0–46.0)
Hemoglobin: 13.4 g/dL (ref 12.0–15.0)
Immature Granulocytes: 0 %
Lymphocytes Relative: 17 %
Lymphs Abs: 2.1 10*3/uL (ref 0.7–4.0)
MCH: 31.4 pg (ref 26.0–34.0)
MCHC: 35.9 g/dL (ref 30.0–36.0)
MCV: 87.4 fL (ref 80.0–100.0)
Monocytes Absolute: 0.5 10*3/uL (ref 0.1–1.0)
Monocytes Relative: 4 %
Neutro Abs: 9.5 10*3/uL — ABNORMAL HIGH (ref 1.7–7.7)
Neutrophils Relative %: 77 %
Platelets: 312 10*3/uL (ref 150–400)
RBC: 4.27 MIL/uL (ref 3.87–5.11)
RDW: 12.4 % (ref 11.5–15.5)
WBC: 12.3 10*3/uL — ABNORMAL HIGH (ref 4.0–10.5)
nRBC: 0 % (ref 0.0–0.2)

## 2023-03-07 LAB — URINALYSIS, ROUTINE W REFLEX MICROSCOPIC
Glucose, UA: NEGATIVE mg/dL
Hgb urine dipstick: NEGATIVE
Ketones, ur: NEGATIVE mg/dL
Leukocytes,Ua: NEGATIVE
Nitrite: NEGATIVE
Protein, ur: 30 mg/dL — AB
Specific Gravity, Urine: >=1.03 (ref 1.005–1.030)
pH: 6 (ref 5.0–8.0)

## 2023-03-07 LAB — HCG, QUANTITATIVE, PREGNANCY: hCG, Beta Chain, Quant, S: 69648 m[IU]/mL — ABNORMAL HIGH (ref <5)

## 2023-03-07 LAB — URINALYSIS, MICROSCOPIC (REFLEX)

## 2023-03-07 LAB — LIPASE, BLOOD: Lipase: 28 U/L (ref 11–51)

## 2023-03-07 MED ORDER — VITAMIN B-6 50 MG PO TABS: 50.0000 mg | ORAL_TABLET | Freq: Every day | ORAL | 0 refills | Status: DC

## 2023-03-07 MED ORDER — METOCLOPRAMIDE HCL 5 MG/ML IJ SOLN
10.0000 mg | Freq: Once | INTRAMUSCULAR | Status: AC
  Administered 2023-03-07: 10 mg via INTRAVENOUS
  Filled 2023-03-07: qty 2

## 2023-03-07 MED ORDER — CEPHALEXIN 500 MG PO CAPS: 500.0000 mg | ORAL_CAPSULE | Freq: Two times a day (BID) | ORAL | 0 refills | Status: AC

## 2023-03-07 MED ORDER — ONDANSETRON HCL 4 MG/2ML IJ SOLN
4.0000 mg | Freq: Once | INTRAMUSCULAR | Status: AC
  Administered 2023-03-07: 4 mg via INTRAVENOUS
  Filled 2023-03-07: qty 2

## 2023-03-07 MED ORDER — SODIUM CHLORIDE 0.9 % IV BOLUS
1000.0000 mL | Freq: Once | INTRAVENOUS | Status: AC
  Administered 2023-03-07: 1000 mL via INTRAVENOUS

## 2023-03-07 MED ORDER — HYDROXYZINE HCL 10 MG PO TABS: 10.0000 mg | ORAL_TABLET | Freq: Once | ORAL | Status: DC

## 2023-03-07 MED ORDER — PYRIDOXINE HCL 50 MG PO TABS: 50.0000 mg | ORAL_TABLET | Freq: Every day | ORAL | 0 refills | Status: DC

## 2023-03-07 NOTE — ED Triage Notes (Addendum)
Pt reports pelvic cramping, and n/v. Pt [redacted] weeks pregnant. Small amount of white discharge. Reports decreased PO intake.

## 2023-03-07 NOTE — Telephone Encounter (Signed)
Grandmother called requesting prescription for pyridoxine to be resent as the pharmacy did not receive it. Will attempt to send again. Grandmother made aware the prescription is also an over the counter medicine that can be purchased if the pharmacy does not have the specific dosage/formulation.

## 2023-03-07 NOTE — Discharge Instructions (Signed)
You were seen in the emergency today for abdominal discomfort and vomiting.  Your lab work was reassuring today.  Your blood testing and ultrasound did confirm that you are [redacted] weeks gestation.  We have given you IV fluids and nausea medication.  I have sent a prescription to your pharmacy of a vitamin that acts as a prenatal and nausea medication during pregnancy.  I have attached the [facility] information for [facility] for you to follow-up with.  If you have any further issues regarding pelvic pain, vaginal bleeding, or persistent vomiting, please go to the [facility] at [facility] for further evaluation.

## 2023-03-07 NOTE — ED Notes (Addendum)
PO challenge passed, able to keep everything down.

## 2023-03-07 NOTE — ED Provider Notes (Signed)
[place] [facility] AT [facility] Provider Note   CSN: [id number] Arrival date & time: 03/07/23  1012     History  Chief Complaint  Patient presents with   Emesis    Misty Jimenez is a 27 y.o. female with history of ADHD, PCOS, endometriosis, anxiety, depression, hyperlipidemia, substance use disorder who presents the emergency department complaining of pelvic cramping, nausea and vomiting.  Patient states that she is approximately [redacted] weeks pregnant.  She recently came here from [place] where she was told she was pregnant.  She states that she saw an OB/GYN there, but has not found one here yet.  Her partner states that she has been vomiting significantly for the past 13 weeks.  Is unable to keep much anything down.  Not have any vaginal bleeding or discharge.  Some discomfort with urination.  Emesis      Home Medications Prior to Admission medications   Medication Sig Start Date End Date Taking? Authorizing Provider  cephALEXin (KEFLEX) 500 MG capsule Take 1 capsule (500 mg total) by mouth 2 (two) times daily for 7 days. 03/07/23 03/14/23 Yes Amanat Hackel T, PA-C  pyridoxine (B-6) 50 MG tablet Take 1 tablet (50 mg total) by mouth daily. 03/07/23 04/06/23 Yes Edmund Rick T, PA-C  albuterol (VENTOLIN HFA) 108 (90 Base) MCG/ACT inhaler Inhale 1-2 puffs into the lungs every 6 (six) hours as needed for wheezing or shortness of breath. 12/27/19   Bradd Canary, MD  benzonatate (TESSALON) 100 MG capsule Take 1 capsule (100 mg total) by mouth 3 (three) times daily as needed for cough. 12/27/19   Bradd Canary, MD  etonogestrel (NEXPLANON) 68 MG IMPL implant 1 each by Subdermal route once.    [provider]  methylPREDNISolone (MEDROL) 4 MG tablet 5 tab po qd X 1d then 4 tab po qd X 1d then 3 tab po qd X 1d then 2 tab po qd then 1 tab po qd 12/27/19   Bradd Canary, MD  metoCLOPramide (REGLAN) 5 MG tablet Take 1 tablet (5 mg total) by mouth every 6  (six) hours. 12/29/19   Rise Mu, PA-C  prazosin (MINIPRESS) 1 MG capsule Take 2 capsules (2 mg total) by mouth at bedtime. 08/05/19   Armandina Stammer I, NP  QUEtiapine (SEROQUEL) 200 MG tablet Take 200 mg by mouth at bedtime.    [provider]  QUEtiapine (SEROQUEL) 300 MG tablet Take 2 tablets (600 mg total) by mouth at bedtime. For mood control 08/05/19   Armandina Stammer I, NP      Allergies    Sulfa antibiotics, Sulfasalazine, Other, Monosodium glutamate, and Sulfa drugs cross reactors    Review of Systems   Review of Systems  Constitutional:  Positive for appetite change.  Gastrointestinal:  Positive for nausea and vomiting.  Genitourinary:  Positive for pelvic pain.  All other systems reviewed and are negative.   Physical Exam Updated Vital Signs BP (!) 118/99 (BP Location: Right Arm)   Pulse (!) 55   Temp 97.8 F (36.6 C) (Oral)   Resp 18   Ht 5\' 7"  (1.702 m)   Wt 97.5 kg   SpO2 100%   BMI 33.67 kg/m  Physical Exam Vitals and nursing note reviewed.  Constitutional:      Appearance: Normal appearance.  HENT:     Head: Normocephalic and atraumatic.  Eyes:     Conjunctiva/sclera: Conjunctivae normal.  Cardiovascular:     Rate and Rhythm: Normal  rate and regular rhythm.  Pulmonary:     Effort: Pulmonary effort is normal. No respiratory distress.     Breath sounds: Normal breath sounds.  Abdominal:     General: There is no distension.     Palpations: Abdomen is soft.     Tenderness: There is abdominal tenderness in the suprapubic area.     Comments: Suprapubic tenderness to deep palpation, no guarding  Skin:    General: Skin is warm and dry.  Neurological:     General: No focal deficit present.     Mental Status: She is alert.     ED Results / Procedures / Treatments   Labs (all labs ordered are listed, but only abnormal results are displayed) Labs Reviewed  URINALYSIS, ROUTINE W REFLEX MICROSCOPIC - Abnormal; Notable for the following  components:      Result Value   APPearance CLOUDY (*)    Bilirubin Urine SMALL (*)    Protein, ur 30 (*)    All other components within normal limits  HCG, QUANTITATIVE, PREGNANCY - Abnormal; Notable for the following components:   hCG, Beta Chain, Misty Jimenez 16,109 (*)    All other components within normal limits  CBC WITH DIFFERENTIAL/PLATELET - Abnormal; Notable for the following components:   WBC 12.3 (*)    Neutro Abs 9.5 (*)    All other components within normal limits  COMPREHENSIVE METABOLIC PANEL - Abnormal; Notable for the following components:   Sodium 134 (*)    CO2 21 (*)    Calcium 8.7 (*)    Total Protein 6.4 (*)    Albumin 3.3 (*)    All other components within normal limits  URINALYSIS, MICROSCOPIC (REFLEX) - Abnormal; Notable for the following components:   Bacteria, UA FEW (*)    All other components within normal limits  LIPASE, BLOOD    EKG None  Radiology US OB Comp < 14 Wks  Result Date: 03/07/2023 CLINICAL DATA:  Pelvic cramping. EXAM: OBSTETRIC <14 WK ULTRASOUND TECHNIQUE: Transabdominal ultrasound was performed for evaluation of the gestation as well as the maternal uterus and adnexal regions. COMPARISON:  None Available. FINDINGS: Intrauterine gestational sac: Single Yolk sac:  Visualized. Embryo:  Visualized. Cardiac Activity: Visualized. Heart Rate: 171 bpm CRL:   66.6 mm   13 w 0 d                  [place] EDC: September 12, 2023 Subchorionic hemorrhage:  None visualized. Maternal uterus/adnexae: Normal appearance of the right ovary. The left ovary could not be visualized. No free fluid in the pelvis. IMPRESSION: Single viable intrauterine pregnancy without evidence of complication. Electronically Signed   By: Emmaline Kluver M.D.   On: 03/07/2023 13:37    Procedures Procedures    Medications Ordered in ED Medications  sodium chloride 0.9 % bolus 1,000 mL (0 mLs Intravenous Stopped 03/07/23 1306)  ondansetron (ZOFRAN) injection 4 mg (4 mg Intravenous  Given 03/07/23 1156)  metoCLOPramide (REGLAN) injection 10 mg (10 mg Intravenous Given 03/07/23 1417)    ED Course/ Medical Decision Making/ A&P                             Medical Decision Making Amount and/or Complexity of Data Reviewed Labs: ordered. Radiology: ordered.  Risk OTC drugs. Prescription drug management.   This patient is a 27 y.o. female  who presents to the ED for concern of pelvic cramping, nausea, vomiting. Reports  [redacted] weeks gestation.  Differential diagnoses prior to evaluation: The emergent differential diagnosis includes, but is not limited to,  ectopic pregnancy, miscarriage, hyperemesis gravidarum, STD/PID, UTI. This is not an exhaustive differential.   Past Medical History / Co-morbidities: ADHD, PCOS, endometriosis, anxiety, depression, hyperlipidemia, substance use disorder  Additional history: Chart reviewed. Pertinent results include: Cannot view records from outside hospital diagnosing pregnancy.  Most recent ER visit from January 2024 in the system in which patient had concerns about relapsing on methamphetamines.  Physical Exam: Physical exam performed. The pertinent findings include: Normal vital signs, no acute distress.  Suprapubic tenderness to deep palpation.  Otherwise abdomen soft and nontender.  Lab Tests/Imaging studies: I personally interpreted labs/imaging and the pertinent results include: Leukocytosis of 12.3, normal hemoglobin.  CMP grossly unremarkable.  Normal lipase.  Urinalysis with increased protein, few bacteria.  Beta-hCG [id number].   Pelvic ultrasound with single viable intrauterine pregnancy at 13 weeks 0 days gestation, no evidence of complication.  Medications: I ordered medication including IV fluids and antiemetics.  I have reviewed the patients home medicines and have made adjustments as needed.  Patient passed p.o. challenge.   Disposition: After consideration of the diagnostic results and the patients response to  treatment, I feel that emergency department workup does not suggest an emergent condition requiring admission or immediate intervention beyond what has been performed at this time. The plan is: Discharged home with symptomatic management of vomiting and second trimester pregnancy, possible hyperemesis.  Lab work reassuring today.  Passed p.o. challenge after IV antiemetics.  Will prescribe pyridoxine, and antibiotics for bacteria in pregnancy.  [facility] and instructed patient to go to [facility] for emergent concerns regarding pregnancy.  The patient is safe for discharge and has been instructed to return immediately for worsening symptoms, change in symptoms or any other concerns.  Final Clinical Impression(s) / ED Diagnoses Final diagnoses:  Hyperemesis gravidarum  Bacteriuria during pregnancy    Rx / DC Orders ED Discharge Orders          Ordered    pyridoxine (B-6) 50 MG tablet  Daily        03/07/23 1535    cephALEXin (KEFLEX) 500 MG capsule  2 times daily        03/07/23 1540           Portions of this report may have been transcribed using voice recognition software. Every effort was made to ensure accuracy; however, inadvertent computerized transcription errors may be present.    Jeanella Flattery 03/07/23 1543    Gwyneth Sprout, MD 03/10/23 1710

## 2023-03-07 NOTE — ED Notes (Signed)
D/c paperwork reviewed with pt, including prescriptions and follow up care.  All questions and/or concerns addressed at time of d/c.  No further needs expressed. . Pt verbalized understanding, Ambulatory without assistance to ED exit, NAD.   

## 2023-03-26 ENCOUNTER — Encounter (HOSPITAL_BASED_OUTPATIENT_CLINIC_OR_DEPARTMENT_OTHER): Payer: Self-pay

## 2023-03-26 ENCOUNTER — Emergency Department (HOSPITAL_BASED_OUTPATIENT_CLINIC_OR_DEPARTMENT_OTHER)
Admission: EM | Admit: 2023-03-26 | Discharge: 2023-03-26 | Disposition: A | Discharge status: Home / Self Care | Payer: 59 | Attending: Emergency Medicine | Admitting: Emergency Medicine

## 2023-03-26 DIAGNOSIS — Z3A17 17 weeks gestation of pregnancy: Secondary | ICD-10-CM | POA: Diagnosis not present

## 2023-03-26 DIAGNOSIS — O26892 Other specified pregnancy related conditions, second trimester: Secondary | ICD-10-CM | POA: Insufficient documentation

## 2023-03-26 DIAGNOSIS — O219 Vomiting of pregnancy, unspecified: Secondary | ICD-10-CM | POA: Diagnosis not present

## 2023-03-26 DIAGNOSIS — R109 Unspecified abdominal pain: Secondary | ICD-10-CM | POA: Insufficient documentation

## 2023-03-26 DIAGNOSIS — Z3490 Encounter for supervision of normal pregnancy, unspecified, unspecified trimester: Secondary | ICD-10-CM

## 2023-03-26 LAB — URINALYSIS, ROUTINE W REFLEX MICROSCOPIC
Bilirubin Urine: NEGATIVE
Glucose, UA: NEGATIVE mg/dL
Hgb urine dipstick: NEGATIVE
Ketones, ur: NEGATIVE mg/dL
Leukocytes,Ua: NEGATIVE
Nitrite: NEGATIVE
Protein, ur: 30 mg/dL — AB
Specific Gravity, Urine: >=1.03 (ref 1.005–1.030)
pH: 6 (ref 5.0–8.0)

## 2023-03-26 LAB — BASIC METABOLIC PANEL
Anion gap: 8 (ref 5–15)
BUN: 8 mg/dL (ref 6–20)
CO2: 21 mmol/L — ABNORMAL LOW (ref 22–32)
Calcium: 9.3 mg/dL (ref 8.9–10.3)
Chloride: 106 mmol/L (ref 98–111)
Creatinine, Ser: 0.52 mg/dL (ref 0.44–1.00)
GFR, Estimated: >60 mL/min (ref >60)
Glucose, Bld: 91 mg/dL (ref 70–99)
Potassium: 4.1 mmol/L (ref 3.5–5.1)
Sodium: 135 mmol/L (ref 135–145)

## 2023-03-26 LAB — CBC
HCT: 36.1 % (ref 36.0–46.0)
Hemoglobin: 12.8 g/dL (ref 12.0–15.0)
MCH: 31.1 pg (ref 26.0–34.0)
MCHC: 35.5 g/dL (ref 30.0–36.0)
MCV: 87.6 fL (ref 80.0–100.0)
Platelets: 290 10*3/uL (ref 150–400)
RBC: 4.12 MIL/uL (ref 3.87–5.11)
RDW: 12.4 % (ref 11.5–15.5)
WBC: 10.9 10*3/uL — ABNORMAL HIGH (ref 4.0–10.5)
nRBC: 0 % (ref 0.0–0.2)

## 2023-03-26 LAB — URINALYSIS, MICROSCOPIC (REFLEX): Squamous Epithelial / HPF: >50 /HPF (ref 0–5)

## 2023-03-26 MED ORDER — SODIUM CHLORIDE 0.9 % IV BOLUS (SEPSIS)
1000.0000 mL | Freq: Once | INTRAVENOUS | Status: AC
  Administered 2023-03-26: 1000 mL via INTRAVENOUS

## 2023-03-26 MED ORDER — ONDANSETRON HCL 4 MG/2ML IJ SOLN
4.0000 mg | Freq: Once | INTRAMUSCULAR | Status: AC
  Administered 2023-03-26: 4 mg via INTRAVENOUS
  Filled 2023-03-26: qty 2

## 2023-03-26 NOTE — Discharge Instructions (Addendum)
The test today in the ED were reassuring.  No signs of urinary tract infection or severe dehydration.  Follow-up with an OB/GYN doctor for further evaluation.  Schedule an appointment as soon as possible

## 2023-03-26 NOTE — ED Triage Notes (Addendum)
Pt is [redacted] weeks pregnant, G1P0. Dx with hyperemesis earlier this month. States have been having headache the past few days with lower abdominal cramping. Denies vaginal discharge/bleeding.   Does not have OBGYN, requesting resources.

## 2023-03-26 NOTE — ED Provider Notes (Signed)
[place] [facility] Provider Note   CSN: [id number] Arrival date & time: 03/26/23  1028     History  Chief Complaint  Patient presents with   Abdominal Pain    Pregnant    Misty Jimenez is a 27 y.o. female.   Abdominal Pain    Patient has a history of of ADHD, reflux, polycystic ovarian syndrome, recent pregnancy who presents ED for evaluation of nausea vomiting abdominal cramping.  Patient is G1, P0 at [redacted] weeks EGA.  Patient states she was diagnosed with hyperemesis during this pregnancy.  She has continued to have nausea vomiting not keeping much down.  She started having abdominal cramping last few days.  She does not have any vaginal bleeding.  No vaginal discharge.  She was seen in the emergency room on May 11.  Patient had a confirmed pregnancy at that time.  She had an ultrasound on May 11 that showed at 13 weeks 0 days.  She had a viable intrauterine pregnancy noted .  Home Medications Prior to Admission medications   Medication Sig Start Date End Date Taking? Authorizing Provider  albuterol (VENTOLIN HFA) 108 (90 Base) MCG/ACT inhaler Inhale 1-2 puffs into the lungs every 6 (six) hours as needed for wheezing or shortness of breath. 12/27/19   Bradd Canary, MD  benzonatate (TESSALON) 100 MG capsule Take 1 capsule (100 mg total) by mouth 3 (three) times daily as needed for cough. 12/27/19   Bradd Canary, MD  etonogestrel (NEXPLANON) 68 MG IMPL implant 1 each by Subdermal route once.    [provider]  methylPREDNISolone (MEDROL) 4 MG tablet 5 tab po qd X 1d then 4 tab po qd X 1d then 3 tab po qd X 1d then 2 tab po qd then 1 tab po qd 12/27/19   Bradd Canary, MD  metoCLOPramide (REGLAN) 5 MG tablet Take 1 tablet (5 mg total) by mouth every 6 (six) hours. 12/29/19   Rise Mu, PA-C  prazosin (MINIPRESS) 1 MG capsule Take 2 capsules (2 mg total) by mouth at bedtime. 08/05/19   Armandina Stammer I, NP  pyridOXINE (VITAMIN  B6) 50 MG tablet Take 1 tablet (50 mg total) by mouth daily. 03/07/23   Roemhildt, Lorin T, PA-C  QUEtiapine (SEROQUEL) 200 MG tablet Take 200 mg by mouth at bedtime.    [provider]  QUEtiapine (SEROQUEL) 300 MG tablet Take 2 tablets (600 mg total) by mouth at bedtime. For mood control 08/05/19   Armandina Stammer I, NP      Allergies    Sulfa antibiotics, Sulfasalazine, Other, Monosodium glutamate, and Sulfa drugs cross reactors    Review of Systems   Review of Systems  Gastrointestinal:  Positive for abdominal pain.    Physical Exam Updated Vital Signs BP 113/86 (BP Location: Right Arm)   Pulse 72   Temp 98.1 F (36.7 C) (Oral)   Resp 18   Ht 1.702 m (5\' 7" )   Wt 97.5 kg   LMP 12/03/2022 (Approximate)   SpO2 100%   BMI 33.67 kg/m  Physical Exam Vitals and nursing note reviewed.  Constitutional:      General: She is not in acute distress.    Appearance: She is well-developed.  HENT:     Head: Normocephalic and atraumatic.     Right Ear: External ear normal.     Left Ear: External ear normal.  Eyes:     General: No scleral icterus.  Right eye: No discharge.        Left eye: No discharge.     Conjunctiva/sclera: Conjunctivae normal.  Neck:     Trachea: No tracheal deviation.  Cardiovascular:     Rate and Rhythm: Normal rate and regular rhythm.  Pulmonary:     Effort: Pulmonary effort is normal. No respiratory distress.     Breath sounds: Normal breath sounds. No stridor. No wheezing or rales.  Abdominal:     General: Bowel sounds are normal. There is no distension.     Palpations: Abdomen is soft.     Tenderness: There is no abdominal tenderness. There is no guarding or rebound.  Musculoskeletal:        General: No tenderness or deformity.     Cervical back: Neck supple.  Skin:    General: Skin is warm and dry.     Findings: No rash.  Neurological:     General: No focal deficit present.     Mental Status: She is alert.     Cranial Nerves: No  cranial nerve deficit, dysarthria or facial asymmetry.     Sensory: No sensory deficit.     Motor: No abnormal muscle tone or seizure activity.     Coordination: Coordination normal.  Psychiatric:        Mood and Affect: Mood normal.     ED Results / Procedures / Treatments   Labs (all labs ordered are listed, but only abnormal results are displayed) Labs Reviewed  CBC - Abnormal; Notable for the following components:      Result Value   WBC 10.9 (*)    All other components within normal limits  BASIC METABOLIC PANEL - Abnormal; Notable for the following components:   CO2 21 (*)    All other components within normal limits  URINALYSIS, ROUTINE W REFLEX MICROSCOPIC - Abnormal; Notable for the following components:   APPearance HAZY (*)    Protein, ur 30 (*)    All other components within normal limits  URINALYSIS, MICROSCOPIC (REFLEX) - Abnormal; Notable for the following components:   Bacteria, UA RARE (*)    All other components within normal limits  URINE CULTURE    EKG None  Radiology No results found.  Procedures Procedures    Medications Ordered in ED Medications  sodium chloride 0.9 % bolus 1,000 mL (0 mLs Intravenous Stopped 03/26/23 1210)  ondansetron (ZOFRAN) injection 4 mg (4 mg Intravenous Given 03/26/23 1117)    ED Course/ Medical Decision Making/ A&P Clinical Course as of 03/26/23 1210  Thu Mar 26, 2023  1157 CBC metabolic panel normal.  Urinalysis not suggestive of infection [JK]    Clinical Course User Index [JK] Linwood Dibbles, MD                             Medical Decision Making Problems Addressed: Pregnancy, unspecified gestational age: chronic illness or injury with exacerbation, progression, or side effects of treatment  Amount and/or Complexity of Data Reviewed Labs: ordered. Decision-making details documented in ED Course.  Risk Prescription drug management.   Patient presents to the ED with complaints of persistent nausea and  vomiting and abdominal cramping.  She has not had any vaginal bleeding.  No discharge.  She was concerned about persistent UTI although urinalysis is unremarkable.  Patient has not had any nausea vomiting here in the ED.  She was given IV fluids.  Patient has to be discharged prior to  completing her fluid boluses as she was feeling better and wanted to leave.  Recommended follow-up with an OB/GYN to establish prenatal care.        Final Clinical Impression(s) / ED Diagnoses Final diagnoses:  Pregnancy, unspecified gestational age    Rx / DC Orders ED Discharge Orders     None         Linwood Dibbles, MD 03/26/23 1211

## 2023-03-27 LAB — URINE CULTURE: Culture: <10000 — AB

## 2023-03-31 ENCOUNTER — Emergency Department (HOSPITAL_BASED_OUTPATIENT_CLINIC_OR_DEPARTMENT_OTHER): Payer: 59

## 2023-03-31 ENCOUNTER — Emergency Department (HOSPITAL_BASED_OUTPATIENT_CLINIC_OR_DEPARTMENT_OTHER)
Admission: EM | Admit: 2023-03-31 | Discharge: 2023-03-31 | Disposition: A | Discharge status: Home / Self Care | Payer: 59 | Attending: Emergency Medicine | Admitting: Emergency Medicine

## 2023-03-31 ENCOUNTER — Encounter (HOSPITAL_BASED_OUTPATIENT_CLINIC_OR_DEPARTMENT_OTHER): Payer: Self-pay

## 2023-03-31 DIAGNOSIS — R112 Nausea with vomiting, unspecified: Secondary | ICD-10-CM

## 2023-03-31 DIAGNOSIS — O211 Hyperemesis gravidarum with metabolic disturbance: Secondary | ICD-10-CM | POA: Insufficient documentation

## 2023-03-31 DIAGNOSIS — Z3A18 18 weeks gestation of pregnancy: Secondary | ICD-10-CM | POA: Insufficient documentation

## 2023-03-31 DIAGNOSIS — O219 Vomiting of pregnancy, unspecified: Secondary | ICD-10-CM | POA: Insufficient documentation

## 2023-03-31 DIAGNOSIS — Z3490 Encounter for supervision of normal pregnancy, unspecified, unspecified trimester: Secondary | ICD-10-CM

## 2023-03-31 DIAGNOSIS — Z8759 Personal history of other complications of pregnancy, childbirth and the puerperium: Secondary | ICD-10-CM

## 2023-03-31 DIAGNOSIS — O26852 Spotting complicating pregnancy, second trimester: Secondary | ICD-10-CM | POA: Diagnosis present

## 2023-03-31 DIAGNOSIS — O0001 Abdominal pregnancy with intrauterine pregnancy: Secondary | ICD-10-CM | POA: Diagnosis not present

## 2023-03-31 DIAGNOSIS — D72829 Elevated white blood cell count, unspecified: Secondary | ICD-10-CM | POA: Insufficient documentation

## 2023-03-31 DIAGNOSIS — O4690 Antepartum hemorrhage, unspecified, unspecified trimester: Secondary | ICD-10-CM

## 2023-03-31 LAB — URINALYSIS, ROUTINE W REFLEX MICROSCOPIC
Bilirubin Urine: NEGATIVE
Glucose, UA: NEGATIVE mg/dL
Hgb urine dipstick: NEGATIVE
Ketones, ur: NEGATIVE mg/dL
Leukocytes,Ua: NEGATIVE
Nitrite: NEGATIVE
Protein, ur: NEGATIVE mg/dL
Specific Gravity, Urine: 1.02 (ref 1.005–1.030)
pH: 7.5 (ref 5.0–8.0)

## 2023-03-31 LAB — MAGNESIUM: Magnesium: 1.8 mg/dL (ref 1.7–2.4)

## 2023-03-31 LAB — CBC
HCT: 36.7 % (ref 36.0–46.0)
Hemoglobin: 12.9 g/dL (ref 12.0–15.0)
MCH: 31.3 pg (ref 26.0–34.0)
MCHC: 35.1 g/dL (ref 30.0–36.0)
MCV: 89.1 fL (ref 80.0–100.0)
Platelets: 304 10*3/uL (ref 150–400)
RBC: 4.12 MIL/uL (ref 3.87–5.11)
RDW: 12.5 % (ref 11.5–15.5)
WBC: 13.8 10*3/uL — ABNORMAL HIGH (ref 4.0–10.5)
nRBC: 0 % (ref 0.0–0.2)

## 2023-03-31 LAB — BASIC METABOLIC PANEL
Anion gap: 9 (ref 5–15)
BUN: 7 mg/dL (ref 6–20)
CO2: 21 mmol/L — ABNORMAL LOW (ref 22–32)
Calcium: 8.8 mg/dL — ABNORMAL LOW (ref 8.9–10.3)
Chloride: 105 mmol/L (ref 98–111)
Creatinine, Ser: 0.47 mg/dL (ref 0.44–1.00)
GFR, Estimated: >60 mL/min (ref >60)
Glucose, Bld: 80 mg/dL (ref 70–99)
Potassium: 3.7 mmol/L (ref 3.5–5.1)
Sodium: 135 mmol/L (ref 135–145)

## 2023-03-31 LAB — HCG, QUANTITATIVE, PREGNANCY: hCG, Beta Chain, Quant, S: 51354 m[IU]/mL — ABNORMAL HIGH (ref <5)

## 2023-03-31 LAB — ABO/RH: ABO/RH(D): O POS

## 2023-03-31 MED ORDER — LACTATED RINGERS IV BOLUS
1000.0000 mL | Freq: Once | INTRAVENOUS | Status: AC
  Administered 2023-03-31: 1000 mL via INTRAVENOUS

## 2023-03-31 MED ORDER — DIPHENHYDRAMINE HCL 50 MG/ML IJ SOLN
25.0000 mg | Freq: Once | INTRAMUSCULAR | Status: AC
  Administered 2023-03-31: 25 mg via INTRAVENOUS
  Filled 2023-03-31: qty 1

## 2023-03-31 MED ORDER — METOCLOPRAMIDE HCL 5 MG/ML IJ SOLN
10.0000 mg | Freq: Once | INTRAMUSCULAR | Status: AC
  Administered 2023-03-31: 10 mg via INTRAVENOUS
  Filled 2023-03-31: qty 2

## 2023-03-31 NOTE — ED Provider Notes (Signed)
[place] [facility] Provider Note   CSN: [id number] Arrival date & time: 03/31/23  4098     History {Add pertinent medical, surgical, social history, OB history to HPI:1} Chief Complaint  Patient presents with   Vaginal Bleeding    Pregnant    Mission L Obas is a 27 y.o. female.  28 year old female with a history of hyperemesis gravidarum who is G1, P0 at approximately 18 weeks pregnancy who presents to the emergency department with vaginal spotting.  Patient reports that she went to the restroom to throw up today and urinated and afterwards noticed some small amount of blood on the toilet paper.  Has been having some lower abdominal cramping but no loss of fluids or contractions.  No bleeding since.  No chest pain, shortness of breath or dizziness.  Has been having significant amount of emesis due to hyperemesis as well recently.       Home Medications Prior to Admission medications   Medication Sig Start Date End Date Taking? Authorizing Provider  albuterol (VENTOLIN HFA) 108 (90 Base) MCG/ACT inhaler Inhale 1-2 puffs into the lungs every 6 (six) hours as needed for wheezing or shortness of breath. 12/27/19   Bradd Canary, MD  benzonatate (TESSALON) 100 MG capsule Take 1 capsule (100 mg total) by mouth 3 (three) times daily as needed for cough. 12/27/19   Bradd Canary, MD  etonogestrel (NEXPLANON) 68 MG IMPL implant 1 each by Subdermal route once.    [provider]  methylPREDNISolone (MEDROL) 4 MG tablet 5 tab po qd X 1d then 4 tab po qd X 1d then 3 tab po qd X 1d then 2 tab po qd then 1 tab po qd 12/27/19   Bradd Canary, MD  metoCLOPramide (REGLAN) 5 MG tablet Take 1 tablet (5 mg total) by mouth every 6 (six) hours. 12/29/19   Rise Mu, PA-C  prazosin (MINIPRESS) 1 MG capsule Take 2 capsules (2 mg total) by mouth at bedtime. 08/05/19   Armandina Stammer I, NP  pyridOXINE (VITAMIN B6) 50 MG tablet Take 1 tablet (50 mg total)  by mouth daily. 03/07/23   Roemhildt, Lorin T, PA-C  QUEtiapine (SEROQUEL) 200 MG tablet Take 200 mg by mouth at bedtime.    [provider]  QUEtiapine (SEROQUEL) 300 MG tablet Take 2 tablets (600 mg total) by mouth at bedtime. For mood control 08/05/19   Armandina Stammer I, NP      Allergies    Sulfa antibiotics, Sulfasalazine, Other, Monosodium glutamate, and Sulfa drugs cross reactors    Review of Systems   Review of Systems  Physical Exam Updated Vital Signs BP 113/72 (BP Location: Right Arm)   Pulse 69   Temp 98.1 F (36.7 C) (Oral)   Resp 16   Ht 5\' 7"  (1.702 m)   Wt 97.5 kg   LMP 12/03/2022 (Approximate)   SpO2 99%   BMI 33.67 kg/m  Physical Exam Vitals and nursing note reviewed.  Constitutional:      General: She is not in acute distress.    Appearance: She is well-developed.  HENT:     Head: Normocephalic and atraumatic.     Right Ear: External ear normal.     Left Ear: External ear normal.     Nose: Nose normal.  Eyes:     Extraocular Movements: Extraocular movements intact.     Conjunctiva/sclera: Conjunctivae normal.     Pupils: Pupils are equal, round, and reactive  to light.  Pulmonary:     Effort: Pulmonary effort is normal. No respiratory distress.  Abdominal:     General: Abdomen is flat. There is no distension.     Palpations: Abdomen is soft. There is no mass.     Tenderness: There is no abdominal tenderness. There is no guarding.  Musculoskeletal:     Cervical back: Normal range of motion and neck supple.     Right lower leg: No edema.     Left lower leg: No edema.  Skin:    General: Skin is warm and dry.  Neurological:     Mental Status: She is alert and oriented to person, place, and time. Mental status is at baseline.  Psychiatric:        Mood and Affect: Mood normal.     ED Results / Procedures / Treatments   Labs (all labs ordered are listed, but only abnormal results are displayed) Labs Reviewed - No data to  display  EKG None  Radiology No results found.  Procedures Procedures  {Document cardiac monitor, telemetry assessment procedure when appropriate:1}  Medications Ordered in ED Medications - No data to display  ED Course/ Medical Decision Making/ A&P   {   Click here for ABCD2, HEART and other calculatorsREFRESH Note before signing :1}                          Medical Decision Making Amount and/or Complexity of Data Reviewed Labs: ordered. Radiology: ordered.  Risk Prescription drug management.   Pria L Kleinsasser is a 27 y.o. female with comorbidities that complicate the patient evaluation including hyperemesis gravidarum and G1P0 at approximately 18 weeks who presents emergency department with vaginal spotting  Initial Ddx:  Normal IUP, ectopic, threatened miscarriage, UTI, hyperemesis gravidarum  MDM:  Feel the patient likely has normal IUP with some spotting at this time.  Is already had an ultrasound which has confirmed placement but will obtain repeat ultrasound to make sure that there are no other complications such as signs of a miscarriage.  Will also check urine for UTI.  With her nausea and vomiting we will go ahead and place an IV and give her some IV fluids as well.  No prior type and screen that I can review so we will obtain a type and screen at this time.  Plan:  Labs Type and screen hCG quant Transvaginal ultrasound  ED Summary/Re-evaluation:  ***  This patient presents to the ED for concern of complaints listed in HPI, this involves an extensive number of treatment options, and is a complaint that carries with it a high risk of complications and morbidity. Disposition including potential need for admission considered.   Dispo: {Disposition:28069}  Additional history obtained from {Additional History:28067} Records reviewed {Records Reviewed:28068} The following labs were independently interpreted: {labs interpreted:28064} and show {lab  findings:28250} I independently reviewed the following imaging with scope of interpretation limited to determining acute life threatening conditions related to emergency care: {imaging interpreted:28065} and agree with the radiologist interpretation with the following exceptions: none I personally reviewed and interpreted cardiac monitoring: {cardiac monitoring:28251} I personally reviewed and interpreted the pt's EKG: see above for interpretation  I have reviewed the patients home medications and made adjustments as needed Consults: {Consultants:28063} Social Determinants of health:  ***   {Document critical care time when appropriate:1} {Document review of labs and clinical decision tools ie heart score, Chads2Vasc2 etc:1}  {Document your independent review of radiology  images, and any outside records:1} {Document your discussion with family members, caretakers, and with consultants:1} {Document social determinants of health affecting pt's care:1} {Document your decision making why or why not admission, treatments were needed:1} Final Clinical Impression(s) / ED Diagnoses Final diagnoses:  None    Rx / DC Orders ED Discharge Orders     None

## 2023-03-31 NOTE — Discharge Instructions (Signed)
You were seen for your vaginal bleeding in the emergency department  Check your MyChart online for the results of any tests that had not resulted by the time you left the emergency department.  If you are Rh- you will need to come back to the emergency department for RhoGAM.  If you are Rh+ you do not need any additional treatment.  Follow-up with your OB/GYN in 2-3 days regarding your visit.    Return immediately to the emergency department if you experience any of the following: Worsening bleeding, dizziness, contractions, or any other concerning symptoms.    Thank you for visiting our Emergency Department. It was a pleasure taking care of you today.

## 2023-03-31 NOTE — ED Notes (Signed)
Reviewed discharge instructions and follow up with pt, Pt states understanding. Ambulatory at discharge . Accompanied by family

## 2023-03-31 NOTE — ED Triage Notes (Signed)
States wiped after urinating this morning and noticed vaginal bleeding. Denies clots. Also c/o abdominal cramping. Pt appx [redacted] weeks pregnant. G1P0. Has no OBGYN. Hx of hyperemesis, seen here last week.

## 2023-04-08 ENCOUNTER — Ambulatory Visit: Payer: Medicaid Other | Admitting: Family Medicine

## 2023-04-08 ENCOUNTER — Encounter: Payer: Self-pay | Admitting: Family Medicine

## 2023-04-08 ENCOUNTER — Other Ambulatory Visit (HOSPITAL_COMMUNITY)
Admission: RE | Admit: 2023-04-08 | Discharge: 2023-04-08 | Disposition: A | Discharge status: Home / Self Care | Payer: 59 | Source: Ambulatory Visit | Attending: Family Medicine | Admitting: Family Medicine

## 2023-04-08 VITALS — BP 97/59 | HR 69 | Wt 207.0 lb

## 2023-04-08 DIAGNOSIS — F502 Bulimia nervosa: Secondary | ICD-10-CM

## 2023-04-08 DIAGNOSIS — O0930 Supervision of pregnancy with insufficient antenatal care, unspecified trimester: Secondary | ICD-10-CM | POA: Diagnosis not present

## 2023-04-08 DIAGNOSIS — Z3A18 18 weeks gestation of pregnancy: Secondary | ICD-10-CM | POA: Diagnosis not present

## 2023-04-08 DIAGNOSIS — F322 Major depressive disorder, single episode, severe without psychotic features: Secondary | ICD-10-CM

## 2023-04-08 DIAGNOSIS — E282 Polycystic ovarian syndrome: Secondary | ICD-10-CM

## 2023-04-08 DIAGNOSIS — O0990 Supervision of high risk pregnancy, unspecified, unspecified trimester: Secondary | ICD-10-CM

## 2023-04-08 DIAGNOSIS — Z5941 Food insecurity: Secondary | ICD-10-CM

## 2023-04-08 MED ORDER — ASPIRIN 81 MG PO TBEC
81.0000 mg | DELAYED_RELEASE_TABLET | Freq: Every day | ORAL | 2 refills | Status: DC
Start: 2023-04-08 — End: 2023-09-04

## 2023-04-08 MED ORDER — METOCLOPRAMIDE HCL 10 MG PO TABS: 10.0000 mg | ORAL_TABLET | Freq: Four times a day (QID) | ORAL | 2 refills | Status: DC | PRN

## 2023-04-08 MED ORDER — FLUOXETINE HCL 20 MG PO CAPS: 20.0000 mg | ORAL_CAPSULE | Freq: Every day | ORAL | 3 refills | Status: DC

## 2023-04-08 NOTE — Progress Notes (Signed)
Subjective:  Misty Jimenez is a G1P0000 [redacted]w[redacted]d being seen today for her first obstetrical visit.  Her obstetrical history is significant for  first pregnancy. 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 Patient does intend to breast feed. Pregnancy history fully reviewed.  Patient reports nausea.  BP (!) 97/59   Pulse 69   Wt 207 lb (93.9 kg)   LMP 12/03/2022 (Approximate)   BMI 32.42 kg/m   HISTORY: OB History  Gravida Para Term Preterm AB Living  1 0 0 0 0 0  SAB IAB Ectopic Multiple Live Births  0 0 0 0 0    # Outcome Date GA Lbr Len/2nd Weight Sex Delivery Anes PTL Lv  1 Current             Past Medical History:  Diagnosis Date   Abdominal pain, chronic, right lower quadrant 08/03/2013   Abdominal pain, recurrent    With Headache   Acute pyelonephritis 05/07/2016   kidney surgery at 94 months of age   ADHD (attention deficit hyperactivity disorder)    Allergy    Anxiety    Depression    Endometriosis    Family history of adverse reaction to anesthesia    "grandmother gets PONV"   GE reflux 12/16/2011   Hyperlipidemia    Kidney disease 05/07/2016   PCOS (polycystic ovarian syndrome)    Preventative health care 11/04/2016   Reflux, vesicoureteral    Respiratory illness with fever 12/27/2019   Wrist pain, left 11/04/2016    Past Surgical History:  Procedure Laterality Date   KIDNEY SURGERY     As a small child   TYMPANOSTOMY TUBE PLACEMENT     URETER SURGERY     WRIST SURGERY Left 05/2016   10 pins and 2 plates    Family History  Problem Relation Age of Onset   Kidney disease Mother    Migraines Maternal Aunt    Diabetes Maternal Grandfather    Hyperlipidemia Other    Hypertension Other    Depression Other    Alcohol abuse Father        and drug use, heroin, cocaine, marijuana   Cancer Paternal Aunt        colon in 18s deceased     Exam  BP (!) 97/59   Pulse  69   Wt 207 lb (93.9 kg)   LMP 12/03/2022 (Approximate)   BMI 32.42 kg/m   Chaperone present during exam  CONSTITUTIONAL: Well-developed, well-nourished female in no acute distress.  HENT:  Normocephalic, atraumatic, External right and left ear normal. Oropharynx is clear and moist EYES: Conjunctivae and EOM are normal. Pupils are equal, round, and reactive to light. No scleral icterus.  NECK: Normal range of motion, supple, no masses.  Normal thyroid.  CARDIOVASCULAR: Normal heart rate noted, regular rhythm RESPIRATORY: Clear to auscultation bilaterally. Effort and breath sounds normal, no problems with respiration noted. BREASTS: Symmetric in size. No masses, skin changes, nipple drainage, or lymphadenopathy. ABDOMEN: Soft, normal bowel sounds, no distention noted.  No tenderness, rebound or guarding.  PELVIC: Normal appearing external genitalia; normal appearing vaginal mucosa and cervix. No abnormal discharge noted. Normal uterine size, no other palpable masses, no uterine or adnexal tenderness. MUSCULOSKELETAL: Normal range of motion. No tenderness.  No cyanosis, clubbing, or edema.  2+ distal pulses. SKIN: Skin is warm and dry. No rash noted. Not diaphoretic. No erythema. No pallor. NEUROLOGIC: Alert and oriented  to person, place, and time. Normal reflexes, muscle tone coordination. No cranial nerve deficit noted. PSYCHIATRIC: Normal mood and affect. Normal behavior. Normal judgment and thought content.    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/2019   Posttraumatic stress disorder    MDD (major depressive disorder), severe (HCC) 10/03/2018   Severe recurrent major depression without psychotic features (HCC)  09/24/2018   Midline back pain 09/12/2018   Leukocytosis 08/22/2018   Urinary urgency 08/22/2018   Severe bipolar I disorder, most recent episode depressed (HCC) 08/22/2018   Fatigue 08/17/2018   MDD (major depressive disorder), recurrent severe, without psychosis (HCC)    Wrist pain, left 11/04/2016   Preventative health care 11/04/2016   Hyperlipidemia    Obesity 05/07/2016   Endometriosis 05/07/2016   Kidney disease 05/07/2016   PCOS (polycystic ovarian syndrome) 08/29/2013   ADHD (attention deficit hyperactivity disorder) 08/03/2013   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 [redacted] weeks gestation of pregnancy  2. Late prenatal care affecting pregnancy, antepartum - [place] MFM OB DETAIL +14 WK; Future  3. Supervision of high risk pregnancy, antepartum FHT and FH normal - CBC/D/Plt+RPR+Rh+ABO+RubIgG... - Enroll Patient in PreNatal Babyscripts - Urine Culture - [place] MFM OB DETAIL +14 WK; Future - Cytology - PAP( [place]) - Ambulatory referral to Integrated Behavioral Health - Comp Met (CMET) - HgB A1c - PANORAMA PRENATAL TEST - Horizon 4 (SMA, CF, FRAGILE X, DMD)  4. MDD (major depressive disorder), severe (HCC) Patient has been on prozac in the past and did well. Will refer to psychiatry.  5. Bulimia Inducing vomiting 4-5 times a day Refer to psychiatry. Start reglan for nausea, which may help with bulimia.  6. PCOS (polycystic ovarian syndrome) Will screen with HgA1c. May need early 2hr GTT   7. Food insecurity Has food stamps. Will refer to [employer] [facility] - AMBULATORY REFERRAL TO [facility]    Initial labs obtained Continue prenatal vitamins Reviewed n/v relief measures and warning s/s to report Reviewed recommended weight gain based on pre-gravid BMI Encouraged well-balanced diet Genetic & carrier screening discussed: requests Panorama, requests Horizon  Ultrasound discussed; fetal survey:  requested CCNC completed> form faxed if has or is planning to apply for medicaid The nature of [facility] [employer] with multiple MDs and other Advanced Practice Providers was explained to patient; also emphasized that fellows, residents, and students are part of our team.    Problem list reviewed and updated. 75% of 30 min visit spent on counseling and coordination of care.     Levie Heritage 04/08/2023

## 2023-04-08 NOTE — Progress Notes (Signed)
Patient interested in quitting smoking.

## 2023-04-09 LAB — CBC/D/PLT+RPR+RH+ABO+RUBIGG...
Antibody Screen: NEGATIVE
Basophils Absolute: 0 10*3/uL (ref 0.0–0.2)
Basos: 0 %
EOS (ABSOLUTE): 0.1 10*3/uL (ref 0.0–0.4)
Eos: 1 %
HCV Ab: NONREACTIVE
HIV Screen 4th Generation wRfx: NONREACTIVE
Hematocrit: 39 % (ref 34.0–46.6)
Hemoglobin: 13.2 g/dL (ref 11.1–15.9)
Hepatitis B Surface Ag: NEGATIVE
Immature Grans (Abs): 0.1 10*3/uL (ref 0.0–0.1)
Immature Granulocytes: 1 %
Lymphocytes Absolute: 2.6 10*3/uL (ref 0.7–3.1)
Lymphs: 18 %
MCH: 31.5 pg (ref 26.6–33.0)
MCHC: 33.8 g/dL (ref 31.5–35.7)
MCV: 93 fL (ref 79–97)
Monocytes Absolute: 0.6 10*3/uL (ref 0.1–0.9)
Monocytes: 4 %
Neutrophils Absolute: 10.7 10*3/uL — ABNORMAL HIGH (ref 1.4–7.0)
Neutrophils: 76 %
Platelets: 359 10*3/uL (ref 150–450)
RBC: 4.19 x10E6/uL (ref 3.77–5.28)
RDW: 13.1 % (ref 11.7–15.4)
RPR Ser Ql: NONREACTIVE
Rh Factor: POSITIVE
Rubella Antibodies, IGG: 1.31 index (ref >0.99)
WBC: 14 10*3/uL — ABNORMAL HIGH (ref 3.4–10.8)

## 2023-04-09 LAB — COMPREHENSIVE METABOLIC PANEL
ALT: 5 IU/L (ref 0–32)
AST: 11 IU/L (ref 0–40)
Albumin/Globulin Ratio: 1.5
Albumin: 3.7 g/dL — ABNORMAL LOW (ref 4.0–5.0)
Alkaline Phosphatase: 65 IU/L (ref 44–121)
BUN/Creatinine Ratio: 17 (ref 9–23)
BUN: 9 mg/dL (ref 6–20)
Bilirubin Total: 0.2 mg/dL (ref 0.0–1.2)
CO2: 22 mmol/L (ref 20–29)
Calcium: 9.7 mg/dL (ref 8.7–10.2)
Chloride: 101 mmol/L (ref 96–106)
Creatinine, Ser: 0.54 mg/dL — ABNORMAL LOW (ref 0.57–1.00)
Globulin, Total: 2.4 g/dL (ref 1.5–4.5)
Glucose: 83 mg/dL (ref 70–99)
Potassium: 4.7 mmol/L (ref 3.5–5.2)
Sodium: 138 mmol/L (ref 134–144)
Total Protein: 6.1 g/dL (ref 6.0–8.5)
eGFR: 132 mL/min/{1.73_m2} (ref >59)

## 2023-04-09 LAB — HEMOGLOBIN A1C
Est. average glucose Bld gHb Est-mCnc: 100 mg/dL
Hgb A1c MFr Bld: 5.1 % (ref 4.8–5.6)

## 2023-04-09 LAB — HCV INTERPRETATION

## 2023-04-10 LAB — CYTOLOGY - PAP
Chlamydia: NEGATIVE
Comment: NEGATIVE
Comment: NORMAL
Neisseria Gonorrhea: NEGATIVE

## 2023-04-10 LAB — URINE CULTURE

## 2023-04-13 ENCOUNTER — Encounter: Payer: Self-pay | Admitting: Family Medicine

## 2023-04-13 DIAGNOSIS — R87612 Low grade squamous intraepithelial lesion on cytologic smear of cervix (LGSIL): Secondary | ICD-10-CM

## 2023-04-13 HISTORY — DX: Low grade squamous intraepithelial lesion on cytologic smear of cervix (LGSIL): R87.612

## 2023-04-15 ENCOUNTER — Telehealth: Payer: Self-pay | Admitting: Clinical

## 2023-04-15 NOTE — Telephone Encounter (Signed)
Attempt call regarding referral; Left HIPPA-compliant message to call back Byrdie Miyazaki from [facility] at  [facility] at  [phone] (Graeson Nouri's office).   

## 2023-04-16 LAB — PANORAMA PRENATAL TEST FULL PANEL:PANORAMA TEST PLUS 5 ADDITIONAL MICRODELETIONS: FETAL FRACTION: 5.5

## 2023-04-17 DIAGNOSIS — O0930 Supervision of pregnancy with insufficient antenatal care, unspecified trimester: Secondary | ICD-10-CM | POA: Insufficient documentation

## 2023-04-17 DIAGNOSIS — F502 Bulimia nervosa, unspecified: Secondary | ICD-10-CM

## 2023-04-17 DIAGNOSIS — O0990 Supervision of high risk pregnancy, unspecified, unspecified trimester: Secondary | ICD-10-CM | POA: Insufficient documentation

## 2023-04-17 HISTORY — DX: Bulimia nervosa, unspecified: F50.20

## 2023-04-17 HISTORY — DX: Bulimia nervosa: F50.2

## 2023-04-18 LAB — HORIZON 4 (SMA, CF, FRAGILE X, DMD)
CYSTIC FIBROSIS: NEGATIVE
DUCHENNE/BECKER MUSCULAR DYSTROPHY: NEGATIVE
FRAGILE X SYNDROME: NEGATIVE
REPORT SUMMARY: NEGATIVE
SPINAL MUSCULAR ATROPHY: NEGATIVE

## 2023-04-21 NOTE — BH Specialist Note (Signed)
Integrated Behavioral Health via Telemedicine Visit  04/28/2023 [name] [id number]  Number of Integrated Behavioral Health Clinician visits: 1- Initial Visit  Session Start time: 1002   Session End time: 1051  Total time in minutes: 49   Referring Provider: Candelaria Celeste, DO Patient/Family location: Home [facility] Provider location: Center for [facility] Healthcare at [facility] for Women  All persons participating in visit: Patient Misty Jimenez and [facility] [name]   Types of Service: Individual psychotherapy and Video visit  I connected with Misty L Hainsworth and/or [name]'s  n/a  via  Telephone or Engineer, civil (consulting)  (Video is Surveyor, mining) and verified that I am speaking with the correct person using two identifiers. Discussed confidentiality: Yes   I discussed the limitations of telemedicine and the availability of in person appointments.  Discussed there is a possibility of technology failure and discussed alternative modes of communication if that failure occurs.  I discussed that engaging in this telemedicine visit, they consent to the provision of behavioral healthcare and the services will be billed under their insurance.  Patient and/or legal guardian expressed understanding and consented to Telemedicine visit: Yes   Presenting Concerns: Patient and/or family reports the following symptoms/concerns: Adjusting to life changes and stressors (married end of May 21, 2022, dog passed away early 05/22/23, positive pregnancy March, grieving loss of sister who passed in April due to fentanyl, followed by guilt and shame over past substance use and current tobacco use); living with grandparents (grandmother with severe depression for decades), current nausea, difficulty keeping food down and lack of quality sleep. Pt's goal is to have a healthy pregnancy.  Duration of problem: Ongoing; Severity of problem:  moderately severe  Patient  and/or Family's Strengths/Protective Factors: Sense of purpose  Goals Addressed: Patient will:  Reduce symptoms of: anxiety, depression, mood instability, and stress   Increase knowledge and/or ability of: stress reduction   Demonstrate ability to: Increase healthy adjustment to current life circumstances, Increase motivation to adhere to plan of care, and Continue healthy grieving over losses  Progress towards Goals: Ongoing  Interventions: Interventions utilized:  Motivational Interviewing, Psychoeducation and/or Health Education, and Supportive Reflection Standardized Assessments completed: Not Needed  Patient and/or Family Response: Patient agrees with treatment plan.   Assessment: Patient currently experiencing Bipolar 1 disorder, PTSD, Borderline personality disorder, ADHD, History of SUD, all as previously diagnosed; Grief; Tobacco use disorder   Patient may benefit from psychoeducation and brief therapeutic interventions regarding coping with symptoms of depression, anxiety and current life stress .  Plan: Follow up with behavioral health clinician on : Two weeks Behavioral recommendations:  -Continue taking both Prozac and prenatal vitamin daily as prescribed -Begin Worry Time strategy, as discussed. Start by setting up start and end time reminders on phone today; continue daily for two weeks. -Keep track of cigarettes smoked in next 24 hours; consider reducing to one less cigarette the next day, replacing that one cigarette with another activity of choice, as discussed. Kindness to self as a daily priority!  -Consider additional resources on After Visit Summary; use as needed Referral(s): Integrated Art gallery manager (In Clinic) and [employer]:  stop smoking, new mom support  I discussed the assessment and treatment plan with the patient and/or parent/guardian. They were provided an opportunity to ask questions and all were answered. They agreed with the  plan and demonstrated an understanding of the instructions.   They were advised to call back or seek an in-person evaluation if the  symptoms worsen or if the condition fails to improve as anticipated.  Misty Close Macarius Ruark, LCSW

## 2023-04-28 ENCOUNTER — Ambulatory Visit (INDEPENDENT_AMBULATORY_CARE_PROVIDER_SITE_OTHER): Payer: 59 | Admitting: Clinical

## 2023-04-28 DIAGNOSIS — F4321 Adjustment disorder with depressed mood: Secondary | ICD-10-CM

## 2023-04-28 NOTE — Patient Instructions (Signed)
Center for [facility] Healthcare at [facility] for Women [address] [place], [place] [address] [phone] (main office) [phone] [facility] office)  New Parent Support  [email or web address]  [email or web address]

## 2023-04-29 NOTE — BH Specialist Note (Signed)
Integrated Behavioral Health via Telemedicine Visit  04/29/2023 Misty Jimenez [id number]  Number of Integrated Behavioral Health Clinician visits: 1- Initial Visit  Session Start time: 1002   Session End time: 1051  Total time in minutes: 49   Referring Provider: *** Patient/Family location: [facility] Provider location: *** All persons participating in visit: *** Types of Service: {CHL AMB TYPE OF SERVICE:[phone]}  I connected with Misty Jimenez and/or Misty Jimenez's {family members:20773} via  Telephone or Engineer, civil (consulting)  (Video is Surveyor, mining) and verified that I am speaking with the correct person using two identifiers. Discussed confidentiality: {YES/NO:21197}  I discussed the limitations of telemedicine and the availability of in person appointments.  Discussed there is a possibility of technology failure and discussed alternative modes of communication if that failure occurs.  I discussed that engaging in this telemedicine visit, they consent to the provision of behavioral healthcare and the services will be billed under their insurance.  Patient and/or legal guardian expressed understanding and consented to Telemedicine visit: {YES/NO:21197}  Presenting Concerns: Patient and/or family reports the following symptoms/concerns: *** Duration of problem: ***; Severity of problem: {Mild/Moderate/Severe:20260}  Patient and/or Family's Strengths/Protective Factors: {CHL AMB BH PROTECTIVE FACTORS:[phone]}  Goals Addressed: Patient will:  Reduce symptoms of: {IBH Symptoms:21014056}   Increase knowledge and/or ability of: {IBH Patient Tools:21014057}   Demonstrate ability to: {IBH Goals:21014053}  Progress towards Goals: {CHL AMB BH PROGRESS TOWARDS GOALS:[phone]}  Interventions: Interventions utilized:  {IBH Interventions:21014054} Standardized Assessments completed: {IBH Screening Tools:21014051}  Patient and/or  Family Response: ***  Assessment: Patient currently experiencing ***.   Patient may benefit from ***.  Plan: Follow up with behavioral health clinician on : *** Behavioral recommendations: *** Referral(s): {IBH Referrals:21014055}  I discussed the assessment and treatment plan with the patient and/or parent/guardian. They were provided an opportunity to ask questions and all were answered. They agreed with the plan and demonstrated an understanding of the instructions.   They were advised to call back or seek an in-person evaluation if the symptoms worsen or if the condition fails to improve as anticipated.  Valetta Close [name], LCSW

## 2023-05-04 ENCOUNTER — Ambulatory Visit (INDEPENDENT_AMBULATORY_CARE_PROVIDER_SITE_OTHER): Payer: 59 | Admitting: Obstetrics & Gynecology

## 2023-05-04 ENCOUNTER — Encounter: Payer: Self-pay | Admitting: Obstetrics & Gynecology

## 2023-05-04 VITALS — BP 97/70 | HR 79 | Wt 201.0 lb

## 2023-05-04 DIAGNOSIS — Z3A21 21 weeks gestation of pregnancy: Secondary | ICD-10-CM

## 2023-05-04 DIAGNOSIS — O0990 Supervision of high risk pregnancy, unspecified, unspecified trimester: Secondary | ICD-10-CM

## 2023-05-04 NOTE — Progress Notes (Signed)
   PRENATAL VISIT NOTE  Subjective:  Misty Jimenez is a 27 y.o. G1P0000 at [redacted]w[redacted]d being seen today for ongoing prenatal care.  She is currently monitored for the following issues for this high-risk pregnancy and has ADHD (attention deficit hyperactivity disorder); Obesity in pregnancy, antepartum; Endometriosis; Severe bipolar I disorder, most recent episode depressed (HCC); Severe recurrent major depression without psychotic features [facility]); MDD (major depressive disorder), severe (HCC); Posttraumatic stress disorder; Borderline personality disorder (HCC); Bipolar 2 disorder (HCC); Tobacco use; LGSIL on Pap smear of cervix; Supervision of high risk pregnancy, antepartum; and Late prenatal care affecting pregnancy, antepartum on their problem list.  Patient reports no complaints.  Contractions: Not present. Vag. Bleeding: None.  Movement: Present. Denies leaking of fluid.   The following portions of the patient's history were reviewed and updated as appropriate: allergies, current medications, past family history, past medical history, past social history, past surgical history and problem list.   Objective:   Vitals:   05/04/23 1027  BP: 97/70  Pulse: 79  Weight: 201 lb (91.2 kg)    Fetal Status: Fetal Heart Rate (bpm): 155   Movement: Present     General:  Alert, oriented and cooperative. Patient is in no acute distress.  Skin: Skin is warm and dry. No rash noted.   Cardiovascular: Normal heart rate noted  Respiratory: Normal respiratory effort, no problems with respiration noted  Abdomen: Soft, gravid, appropriate for gestational age.  Pain/Pressure: Absent     Pelvic: Cervical exam deferred        Extremities: Normal range of motion.  Edema: None  Mental Status: Normal mood and affect. Normal behavior. Normal judgment and thought content.   Assessment and Plan:  Pregnancy: G1P0000 at [redacted]w[redacted]d 1. [redacted] weeks gestation of pregnancy 2. Supervision of high risk pregnancy,  antepartum Needs hemoglobinopathy (wrong Horizon test was ordered last visit); explained to patient, she agreed to this. Counseled about AFP, she desires this.  Had LR NIPS.  Anatomy scan scheduled on 05/12/23. - AFP, Serum, Open Spina Bifida - Hgb Fractionation Cascade Preterm labor symptoms and general obstetric precautions including but not limited to vaginal bleeding, contractions, leaking of fluid and fetal movement were reviewed in detail with the patient. Please refer to After Visit Summary for other counseling recommendations.   Return in about 4 weeks (around 06/01/2023) for OFFICE OB VISIT (MD only).  Future Appointments  Date Time Provider Department Center  05/12/2023 12:45 PM [facility] NURSE [facility]  05/12/2023  1:00 PM [facility] US1 [facility]  05/13/2023  8:15 AM [facility] CLINICIAN [facility]  06/10/2023  8:15 AM Stinson, Rhona Raider, DO [facility] None    Jaynie Collins, MD

## 2023-05-06 LAB — AFP, SERUM, OPEN SPINA BIFIDA
AFP MoM: 1.58
AFP Value: 96.8 ng/mL
Gest. Age on Collection Date: 21.7 weeks
Maternal Age At EDD: 25.3 yr
OSBR Risk 1 IN: 2212
Test Results:: NEGATIVE
Weight: 201 [lb_av]

## 2023-05-07 LAB — HGB FRACTIONATION CASCADE
Hgb A2: 2.7 % (ref 1.8–3.2)
Hgb A: 97.3 % (ref 96.4–98.8)
Hgb F: 0 % (ref 0.0–2.0)
Hgb S: 0 %

## 2023-05-12 ENCOUNTER — Ambulatory Visit: Payer: 59 | Attending: Family Medicine

## 2023-05-12 ENCOUNTER — Other Ambulatory Visit: Payer: Self-pay

## 2023-05-12 ENCOUNTER — Ambulatory Visit: Payer: 59

## 2023-05-12 VITALS — BP 112/58 | HR 59

## 2023-05-12 DIAGNOSIS — Z720 Tobacco use: Secondary | ICD-10-CM

## 2023-05-12 DIAGNOSIS — O99212 Obesity complicating pregnancy, second trimester: Secondary | ICD-10-CM

## 2023-05-12 DIAGNOSIS — O0932 Supervision of pregnancy with insufficient antenatal care, second trimester: Secondary | ICD-10-CM | POA: Diagnosis not present

## 2023-05-12 DIAGNOSIS — O0990 Supervision of high risk pregnancy, unspecified, unspecified trimester: Secondary | ICD-10-CM | POA: Diagnosis present

## 2023-05-12 DIAGNOSIS — O365990 Maternal care for other known or suspected poor fetal growth, unspecified trimester, not applicable or unspecified: Secondary | ICD-10-CM

## 2023-05-12 DIAGNOSIS — O0930 Supervision of pregnancy with insufficient antenatal care, unspecified trimester: Secondary | ICD-10-CM | POA: Insufficient documentation

## 2023-05-12 DIAGNOSIS — O99210 Obesity complicating pregnancy, unspecified trimester: Secondary | ICD-10-CM | POA: Insufficient documentation

## 2023-05-12 DIAGNOSIS — F3181 Bipolar II disorder: Secondary | ICD-10-CM | POA: Insufficient documentation

## 2023-05-12 DIAGNOSIS — Z3A22 22 weeks gestation of pregnancy: Secondary | ICD-10-CM

## 2023-05-13 ENCOUNTER — Ambulatory Visit: Payer: 59 | Admitting: Clinical

## 2023-05-13 DIAGNOSIS — Z91199 Patient's noncompliance with other medical treatment and regimen due to unspecified reason: Secondary | ICD-10-CM

## 2023-05-26 ENCOUNTER — Inpatient Hospital Stay (HOSPITAL_COMMUNITY)
Admission: AD | Admit: 2023-05-26 | Discharge: 2023-05-26 | Disposition: A | Discharge status: Home / Self Care | Payer: 59 | Attending: Obstetrics and Gynecology | Admitting: Obstetrics and Gynecology

## 2023-05-26 DIAGNOSIS — O99212 Obesity complicating pregnancy, second trimester: Secondary | ICD-10-CM | POA: Diagnosis not present

## 2023-05-26 DIAGNOSIS — S39012A Strain of muscle, fascia and tendon of lower back, initial encounter: Secondary | ICD-10-CM | POA: Diagnosis not present

## 2023-05-26 DIAGNOSIS — M545 Low back pain, unspecified: Secondary | ICD-10-CM | POA: Insufficient documentation

## 2023-05-26 DIAGNOSIS — Z3A24 24 weeks gestation of pregnancy: Secondary | ICD-10-CM | POA: Insufficient documentation

## 2023-05-26 DIAGNOSIS — O26899 Other specified pregnancy related conditions, unspecified trimester: Secondary | ICD-10-CM

## 2023-05-26 DIAGNOSIS — X500XXA Overexertion from strenuous movement or load, initial encounter: Secondary | ICD-10-CM | POA: Insufficient documentation

## 2023-05-26 DIAGNOSIS — O26892 Other specified pregnancy related conditions, second trimester: Secondary | ICD-10-CM | POA: Insufficient documentation

## 2023-05-26 LAB — URINALYSIS, ROUTINE W REFLEX MICROSCOPIC
Bilirubin Urine: NEGATIVE
Glucose, UA: NEGATIVE mg/dL
Hgb urine dipstick: NEGATIVE
Ketones, ur: NEGATIVE mg/dL
Leukocytes,Ua: NEGATIVE
Nitrite: NEGATIVE
Protein, ur: 30 mg/dL — AB
Specific Gravity, Urine: 1.026 (ref 1.005–1.030)
pH: 6 (ref 5.0–8.0)

## 2023-05-26 MED ORDER — CYCLOBENZAPRINE HCL 5 MG PO TABS: 5.0000 mg | ORAL_TABLET | Freq: Three times a day (TID) | ORAL | 0 refills | Status: DC | PRN

## 2023-05-26 MED ORDER — CYCLOBENZAPRINE HCL 5 MG PO TABS
5.0000 mg | ORAL_TABLET | Freq: Once | ORAL | Status: AC
  Administered 2023-05-26: 5 mg via ORAL
  Filled 2023-05-26: qty 1

## 2023-05-26 MED ORDER — ACETAMINOPHEN 500 MG PO TABS
1000.0000 mg | ORAL_TABLET | Freq: Four times a day (QID) | ORAL | Status: DC | PRN
  Administered 2023-05-26: 1000 mg via ORAL
  Filled 2023-05-26: qty 2

## 2023-05-26 NOTE — MAU Provider Note (Signed)
History     CSN: [id number]  Arrival date and time: 05/26/23 1806   Event Date/Time   First Provider Initiated Contact with Patient 05/26/23 1847      Chief Complaint  Patient presents with   Pelvic Pain   Abdominal Pain   HPI Patient presenting today for complaints of low back pain which started yesterday around 2 PM.  She reports that she was lifting a heavy box and the pain was sudden onset.  She also noticed considerable pelvic pain at that time.  She reports the pelvic pressure and pain is happening every 6 minutes.  Reports she has not taken any type of medications for this pain.  Denies any vaginal bleeding or leaking of fluid.  Reports good fetal movement.  OB History     Gravida  1   Para  0   Term  0   Preterm  0   AB  0   Living  0      SAB  0   IAB  0   Ectopic  0   Multiple  0   Live Births  0           Past Medical History:  Diagnosis Date   Abdominal pain, chronic, right lower quadrant 08/03/2013   Abdominal pain, recurrent    With Headache   Acute pyelonephritis 05/07/2016   kidney surgery at 71 months of age   ADHD (attention deficit hyperactivity disorder)    Allergy    Anxiety    Bulimia 04/17/2023   Depression    Endometriosis    Family history of adverse reaction to anesthesia    "grandmother gets PONV"   GE reflux 12/16/2011   Hyperlipidemia    Kidney disease 05/07/2016   Obesity 05/07/2016   PCOS (polycystic ovarian syndrome)    Preventative health care 11/04/2016   Reflux, vesicoureteral    Respiratory illness with fever 12/27/2019   Wrist pain, left 11/04/2016    Past Surgical History:  Procedure Laterality Date   KIDNEY SURGERY     As a small child   TYMPANOSTOMY TUBE PLACEMENT     URETER SURGERY     WRIST SURGERY Left 05/2016   10 pins and 2 plates    Family History  Problem Relation Age of Onset   Kidney disease Mother    Alcohol abuse Father        and drug use, heroin, cocaine, marijuana    Migraines Maternal Aunt    Cancer Paternal Aunt        colon in 20s deceased   Asthma Maternal Grandmother    Diabetes Maternal Grandfather    Hyperlipidemia Other    Hypertension Other    Depression Other    Stroke Neg Hx     Social History   Tobacco Use   Smoking status: Some Days    Current packs/day: 0.00    Average packs/day: 0.3 packs/day for 2.0 years (0.5 ttl pk-yrs)    Types: Cigarettes    Start date: 10/27/2013    Last attempt to quit: 10/28/2015    Years since quitting: 7.5   Smokeless tobacco: Former    Types: Chew   Tobacco comments:    Pt declines information  Vaping Use   Vaping status: Some Days   Substances: THC  Substance Use Topics   Alcohol use: Not Currently    Alcohol/week: 2.0 - 5.0 standard drinks of alcohol    Types: 2 - 5 Cans of beer per  week    Comment: 3-4 x week   Drug use: Not Currently    Types: Marijuana    Allergies:  Allergies  Allergen Reactions   Sulfa Antibiotics Hives and Rash   Sulfasalazine Hives   Amphetamines     Reports "different parts of her body melt and causes hallucinations."   Other     Fiberglass cast caused rash and hives   Monosodium Glutamate Nausea And Vomiting, Rash and Other (See Comments)    Headache and dizziness   Sulfa Drugs Cross Reactors Rash    Medications Prior to Admission  Medication Sig Dispense Refill Last Dose   aspirin EC 81 MG tablet Take 1 tablet (81 mg total) by mouth daily. Take after 12 weeks for prevention of preeclampsia later in pregnancy 300 tablet 2 05/25/2023   prenatal vitamin w/FE, FA (NATACHEW) 29-1 MG CHEW chewable tablet Chew 1 tablet by mouth daily at 12 noon.   05/25/2023   FLUoxetine (PROZAC) 20 MG capsule Take 1 capsule (20 mg total) by mouth daily. 30 capsule 3    metoCLOPramide (REGLAN) 10 MG tablet Take 1 tablet (10 mg total) by mouth 4 (four) times daily as needed for nausea or vomiting. (Patient not taking: Reported on 05/04/2023) 30 tablet 2     Review of Systems   Constitutional:  Negative for chills and fever.  HENT:  Negative for congestion.   Eyes:  Negative for visual disturbance.  Respiratory:  Negative for chest tightness and shortness of breath.   Cardiovascular:  Negative for chest pain.  Gastrointestinal:  Positive for abdominal pain. Negative for diarrhea, nausea and vomiting.  Genitourinary:  Positive for pelvic pain. Negative for vaginal bleeding and vaginal discharge.  Musculoskeletal:  Positive for back pain.  All other systems reviewed and are negative.  Physical Exam   Blood pressure (!) 120/55, pulse 68, temperature 98.6 F (37 C), temperature source Oral, resp. rate 16, height 5' 7.25" (1.708 m), weight 94.5 kg, last menstrual period 12/03/2022, SpO2 96%.  Physical Exam Vitals reviewed.  Constitutional:      Appearance: She is well-developed.  HENT:     Head: Normocephalic and atraumatic.     Nose: No congestion.     Mouth/Throat:     Mouth: Mucous membranes are moist.  Eyes:     Extraocular Movements: Extraocular movements intact.  Cardiovascular:     Rate and Rhythm: Normal rate.  Pulmonary:     Effort: Pulmonary effort is normal.  Abdominal:     General: There is no distension.     Palpations: Abdomen is soft.     Tenderness: There is no abdominal tenderness.  Musculoskeletal:     Cervical back: Normal range of motion.     Lumbar back: Tenderness (Paraspinal tenderness to palpation) present. No bony tenderness.  Neurological:     Mental Status: She is alert.     MAU Course  Procedures  MDM Pelvic exam Tylenol Flexeril   Assessment and Plan  Misty Jimenez is a G1@[redacted]w[redacted]d  presenting for low back pain and pelvic pressure.  Low back pain Started immediately after lifting a heavy box yesterday.  Physical exam was reassuring with paraspinal tenderness to palpation.  Patient given 1 mg of Tylenol and 5 mg Flexeril.  On reevaluation patient reports the pain is greatly improved.  Patient discharged home  with prescription for Flexeril and will get Tylenol over-the-counter.  No further questions or concerns.  Strict return precautions given.  Pelvic pressure Pelvic pressure has been  occurring every 6 months since lifting heavy weights.  No visible contractions on monitoring.  Pelvic exam closed/thick/-2.  Discussed adequate hydration and continue to monitor for worsening symptoms.  Discussed return precautions regarding regular contractions, bleeding, gushing of fluid.  Patient is understanding.  Celedonio Savage 05/26/2023, 6:59 PM

## 2023-05-26 NOTE — MAU Note (Signed)
.  Misty Jimenez is a 27 y.o. at [redacted]w[redacted]d here in MAU reporting: Pelvic pain, lower abdominal pain, and lower back pain. Misty Jimenez reports Misty Jimenez lifted a "very heavy box" yesterday around 1400. Misty Jimenez reports the pain was sudden and has continued. Misty Jimenez reports the pains are sharp and cramp like and happen around every 6 minutes. Denies VB or LOF. Reports Misty Jimenez had one episode of clear/mucous discharge around noon today. +FM.   Onset of complaint:   Pain scores:  6/10 pelvis 7/10 lower abdomen 9/10 lower back  FHT: 147 initial external Lab orders placed from triage: UA

## 2023-05-26 NOTE — Discharge Instructions (Signed)
It was a pleasure taking care of you today.  I believe you strained some muscles in your low back.  I sent a prescription for Flexeril to your pharmacy and you can also take over-the-counter Tylenol as needed.  If your pain worsens please return for further evaluation.  Regarding her pelvic pressure this may also be related to the muscle strain.  Your cervix was closed today and I did not notice any leaking fluid.  Please continue to monitor if you notice any leaking of fluid, bleeding, regular contractions please return for further evaluation.  I hope you have a wonderful night!

## 2023-06-04 ENCOUNTER — Encounter: Payer: Self-pay | Admitting: *Deleted

## 2023-06-09 ENCOUNTER — Ambulatory Visit: Payer: 59 | Attending: Obstetrics and Gynecology

## 2023-06-09 ENCOUNTER — Other Ambulatory Visit: Payer: Self-pay | Admitting: *Deleted

## 2023-06-09 DIAGNOSIS — O99212 Obesity complicating pregnancy, second trimester: Secondary | ICD-10-CM | POA: Diagnosis not present

## 2023-06-09 DIAGNOSIS — E669 Obesity, unspecified: Secondary | ICD-10-CM | POA: Diagnosis not present

## 2023-06-09 DIAGNOSIS — O365990 Maternal care for other known or suspected poor fetal growth, unspecified trimester, not applicable or unspecified: Secondary | ICD-10-CM | POA: Diagnosis present

## 2023-06-09 DIAGNOSIS — O0932 Supervision of pregnancy with insufficient antenatal care, second trimester: Secondary | ICD-10-CM | POA: Diagnosis not present

## 2023-06-09 DIAGNOSIS — Z3A26 26 weeks gestation of pregnancy: Secondary | ICD-10-CM | POA: Diagnosis not present

## 2023-06-09 DIAGNOSIS — O99213 Obesity complicating pregnancy, third trimester: Secondary | ICD-10-CM

## 2023-06-10 ENCOUNTER — Ambulatory Visit (INDEPENDENT_AMBULATORY_CARE_PROVIDER_SITE_OTHER): Payer: 59 | Admitting: Family Medicine

## 2023-06-10 VITALS — BP 104/58 | HR 82 | Wt 209.0 lb

## 2023-06-10 DIAGNOSIS — O0992 Supervision of high risk pregnancy, unspecified, second trimester: Secondary | ICD-10-CM

## 2023-06-10 DIAGNOSIS — O99210 Obesity complicating pregnancy, unspecified trimester: Secondary | ICD-10-CM

## 2023-06-10 DIAGNOSIS — Z3A27 27 weeks gestation of pregnancy: Secondary | ICD-10-CM

## 2023-06-10 DIAGNOSIS — Z23 Encounter for immunization: Secondary | ICD-10-CM | POA: Diagnosis not present

## 2023-06-10 DIAGNOSIS — F322 Major depressive disorder, single episode, severe without psychotic features: Secondary | ICD-10-CM

## 2023-06-10 DIAGNOSIS — F3181 Bipolar II disorder: Secondary | ICD-10-CM

## 2023-06-10 DIAGNOSIS — O0990 Supervision of high risk pregnancy, unspecified, unspecified trimester: Secondary | ICD-10-CM

## 2023-06-10 NOTE — Progress Notes (Signed)
   PRENATAL VISIT NOTE  Subjective:  Misty Jimenez is a 27 y.o. G1P0000 at [redacted]w[redacted]d being seen today for ongoing prenatal care.  She is currently monitored for the following issues for this low-risk pregnancy and has Obesity in pregnancy, antepartum; Severe bipolar I disorder, most recent episode depressed (HCC); Severe recurrent major depression without psychotic features [facility]); MDD (major depressive disorder), severe (HCC); Posttraumatic stress disorder; Borderline personality disorder (HCC); Bipolar 2 disorder (HCC); Tobacco use; Supervision of high risk pregnancy, antepartum; and Late prenatal care affecting pregnancy, antepartum on their problem list.  Patient reports no complaints.  Contractions: Irritability. Vag. Bleeding: None.  Movement: Absent. Denies leaking of fluid.   The following portions of the patient's history were reviewed and updated as appropriate: allergies, current medications, past family history, past medical history, past social history, past surgical history and problem list.   Objective:   Vitals:   06/10/23 0812  BP: (!) 104/58  Pulse: 82  Weight: 209 lb (94.8 kg)    Fetal Status: Fetal Heart Rate (bpm): 143   Movement: Absent     General:  Alert, oriented and cooperative. Patient is in no acute distress.  Skin: Skin is warm and dry. No rash noted.   Cardiovascular: Normal heart rate noted  Respiratory: Normal respiratory effort, no problems with respiration noted  Abdomen: Soft, gravid, appropriate for gestational age.  Pain/Pressure: Present     Pelvic: Cervical exam deferred        Extremities: Normal range of motion.  Edema: None  Mental Status: Normal mood and affect. Normal behavior. Normal judgment and thought content.   Assessment and Plan:  Pregnancy: G1P0000 at [redacted]w[redacted]d 1. Supervision of high risk pregnancy, antepartum FHT normal - CBC - Glucose Tolerance, 2 Hours w/1 Hour - HIV Antibody (routine testing w rflx) - RPR - Tdap vaccine greater  than or equal to 7yo IM  2. Obesity in pregnancy, antepartum  3. Bipolar 2 disorder (HCC)  4. MDD (major depressive disorder), severe (HCC) Stable on fluoxetine Discussed increasing it during the end of pregnancy  5. [redacted] weeks gestation of pregnancy - CBC - Glucose Tolerance, 2 Hours w/1 Hour - HIV Antibody (routine testing w rflx) - RPR - Tdap vaccine greater than or equal to 7yo IM  Preterm labor symptoms and general obstetric precautions including but not limited to vaginal bleeding, contractions, leaking of fluid and fetal movement were reviewed in detail with the patient. Please refer to After Visit Summary for other counseling recommendations.   No follow-ups on file.  Future Appointments  Date Time Provider Department Center  06/30/2023  9:55 AM Marny Lowenstein, PA-C [facility] None  07/15/2023  9:15 AM Levie Heritage, DO [facility] None  07/21/2023  3:30 PM [facility] US2 [facility]  07/30/2023  9:55 AM Adrian Blackwater Rhona Raider, DO [facility] None    Levie Heritage, DO

## 2023-06-11 LAB — RPR: RPR Ser Ql: NONREACTIVE

## 2023-06-11 LAB — CBC
Hematocrit: 38.5 % (ref 34.0–46.6)
Hemoglobin: 12.9 g/dL (ref 11.1–15.9)
MCH: 31.6 pg (ref 26.6–33.0)
MCHC: 33.5 g/dL (ref 31.5–35.7)
MCV: 94 fL (ref 79–97)
Platelets: 361 10*3/uL (ref 150–450)
RBC: 4.08 x10E6/uL (ref 3.77–5.28)
RDW: 12.4 % (ref 11.7–15.4)
WBC: 14.8 10*3/uL — ABNORMAL HIGH (ref 3.4–10.8)

## 2023-06-11 LAB — GLUCOSE TOLERANCE, 2 HOURS W/ 1HR
Glucose, 1 hour: 141 mg/dL (ref 70–179)
Glucose, 2 hour: 125 mg/dL (ref 70–152)
Glucose, Fasting: 79 mg/dL (ref 70–91)

## 2023-06-11 LAB — HIV ANTIBODY (ROUTINE TESTING W REFLEX): HIV Screen 4th Generation wRfx: NONREACTIVE

## 2023-06-17 ENCOUNTER — Telehealth: Payer: Self-pay

## 2023-06-17 ENCOUNTER — Encounter (HOSPITAL_COMMUNITY): Payer: Self-pay | Admitting: Obstetrics and Gynecology

## 2023-06-17 ENCOUNTER — Inpatient Hospital Stay (HOSPITAL_COMMUNITY)
Admission: AD | Admit: 2023-06-17 | Discharge: 2023-06-17 | Disposition: A | Discharge status: Home / Self Care | Payer: 59 | Attending: Obstetrics and Gynecology | Admitting: Obstetrics and Gynecology

## 2023-06-17 DIAGNOSIS — O99353 Diseases of the nervous system complicating pregnancy, third trimester: Secondary | ICD-10-CM | POA: Diagnosis present

## 2023-06-17 DIAGNOSIS — O26893 Other specified pregnancy related conditions, third trimester: Secondary | ICD-10-CM | POA: Insufficient documentation

## 2023-06-17 DIAGNOSIS — Z3A28 28 weeks gestation of pregnancy: Secondary | ICD-10-CM

## 2023-06-17 DIAGNOSIS — O99213 Obesity complicating pregnancy, third trimester: Secondary | ICD-10-CM | POA: Diagnosis not present

## 2023-06-17 DIAGNOSIS — G2581 Restless legs syndrome: Secondary | ICD-10-CM

## 2023-06-17 DIAGNOSIS — O99891 Other specified diseases and conditions complicating pregnancy: Secondary | ICD-10-CM | POA: Insufficient documentation

## 2023-06-17 DIAGNOSIS — Z711 Person with feared health complaint in whom no diagnosis is made: Secondary | ICD-10-CM

## 2023-06-17 LAB — COMPREHENSIVE METABOLIC PANEL
ALT: 10 U/L (ref 0–44)
AST: 17 U/L (ref 15–41)
Albumin: 2.8 g/dL — ABNORMAL LOW (ref 3.5–5.0)
Alkaline Phosphatase: 74 U/L (ref 38–126)
Anion gap: 12 (ref 5–15)
BUN: 5 mg/dL — ABNORMAL LOW (ref 6–20)
CO2: 20 mmol/L — ABNORMAL LOW (ref 22–32)
Calcium: 9.1 mg/dL (ref 8.9–10.3)
Chloride: 104 mmol/L (ref 98–111)
Creatinine, Ser: 0.74 mg/dL (ref 0.44–1.00)
GFR, Estimated: >60 mL/min (ref >60)
Glucose, Bld: 80 mg/dL (ref 70–99)
Potassium: 3.9 mmol/L (ref 3.5–5.1)
Sodium: 136 mmol/L (ref 135–145)
Total Bilirubin: 0.7 mg/dL (ref 0.3–1.2)
Total Protein: 6 g/dL — ABNORMAL LOW (ref 6.5–8.1)

## 2023-06-17 MED ORDER — CYCLOBENZAPRINE HCL 5 MG PO TABS
10.0000 mg | ORAL_TABLET | Freq: Once | ORAL | Status: AC
  Administered 2023-06-17: 10 mg via ORAL
  Filled 2023-06-17: qty 2

## 2023-06-17 MED ORDER — CYCLOBENZAPRINE HCL 5 MG PO TABS: 5.0000 mg | ORAL_TABLET | Freq: Three times a day (TID) | ORAL | 0 refills | Status: DC | PRN

## 2023-06-17 NOTE — Telephone Encounter (Signed)
Patient called stating she is having some pain in both legs. Patient states she noticed purple veins in her legs when she woke up this morning. Patient states baby's movements have decreased. Advised patient to go to [facility] st. Understanding was voiced. Misty Jimenez l Berit Raczkowski, CMA

## 2023-06-17 NOTE — MAU Provider Note (Signed)
History     CSN: [id number]  Arrival date and time: 06/17/23 1136   Event Date/Time   First Provider Initiated Contact with Patient 06/17/23 1310      Chief Complaint  Patient presents with   Leg Pain   Misty Jimenez , a  27 y.o. G1P0000 at [redacted]w[redacted]d presents to MAU with complaints of cramping and aching in both her calf. She states that for the last 2 days she has been having "uncomfortable cramping sensations" in the back side of her calf that run upwards towards her buttocks. She also states that yesterday she noticed some bruising that has since resolved. She states that she has been using a restless leg cream with minimal relief. She states that it "hurts to flex her towards her on both sides." She denies swelling or tenderness to touch. She also denies rash or redness near the site. She denies vaginal bleeding leaking of fluid abnormal vaginal discharge and endorses positive fetal movement since arrival to MAU. She reported that she was not feeling movement on arrival.          OB History     Gravida  1   Para  0   Term  0   Preterm  0   AB  0   Living  0      SAB  0   IAB  0   Ectopic  0   Multiple  0   Live Births  0           Past Medical History:  Diagnosis Date   Abdominal pain, chronic, right lower quadrant 08/03/2013   Abdominal pain, recurrent    With Headache   Acute pyelonephritis 05/07/2016   kidney surgery at 32 months of age   ADHD (attention deficit hyperactivity disorder)    Allergy    Anxiety    Bulimia 04/17/2023   Depression    Endometriosis    Family history of adverse reaction to anesthesia    "grandmother gets PONV"   GE reflux 12/16/2011   Hyperlipidemia    Kidney disease 05/07/2016   LGSIL on Pap smear of cervix 04/13/2023   03/2023 LSIL - PAP in 1 year     Obesity 05/07/2016   PCOS (polycystic ovarian syndrome)    Preventative health care 11/04/2016   Reflux, vesicoureteral    Respiratory illness with fever  12/27/2019   Wrist pain, left 11/04/2016    Past Surgical History:  Procedure Laterality Date   KIDNEY SURGERY     As a small child   TYMPANOSTOMY TUBE PLACEMENT     URETER SURGERY     WRIST SURGERY Left 05/2016   10 pins and 2 plates    Family History  Problem Relation Age of Onset   Kidney disease Mother    Alcohol abuse Father        and drug use, heroin, cocaine, marijuana   Migraines Maternal Aunt    Cancer Paternal Aunt        colon in 2s deceased   Asthma Maternal Grandmother    Diabetes Maternal Grandfather    Hyperlipidemia Other    Hypertension Other    Depression Other    Stroke Neg Hx     Social History   Tobacco Use   Smoking status: Former    Current packs/day: 0.00    Average packs/day: 0.3 packs/day for 2.0 years (0.5 ttl pk-yrs)    Types: Cigarettes    Start date: 10/27/2013  Quit date: 10/28/2015    Years since quitting: 7.6   Smokeless tobacco: Former    Types: Chew   Tobacco comments:    Pt declines information  Vaping Use   Vaping status: Some Days   Substances: THC  Substance Use Topics   Alcohol use: Not Currently    Alcohol/week: 2.0 - 5.0 standard drinks of alcohol    Types: 2 - 5 Cans of beer per week    Comment: 3-4 x week   Drug use: Not Currently    Types: Marijuana    Allergies:  Allergies  Allergen Reactions   Sulfa Antibiotics Hives and Rash   Sulfasalazine Hives   Amphetamines     Reports "different parts of her body melt and causes hallucinations."   Other     Fiberglass cast caused rash and hives   Monosodium Glutamate Nausea And Vomiting, Rash and Other (See Comments)    Headache and dizziness   Sulfa Drugs Cross Reactors Rash    Medications Prior to Admission  Medication Sig Dispense Refill Last Dose   FLUoxetine (PROZAC) 20 MG capsule Take 1 capsule (20 mg total) by mouth daily. 30 capsule 3 06/17/2023   aspirin EC 81 MG tablet Take 1 tablet (81 mg total) by mouth daily. Take after 12 weeks for prevention of  preeclampsia later in pregnancy 300 tablet 2 Unknown   cyclobenzaprine (FLEXERIL) 5 MG tablet Take 1 tablet (5 mg total) by mouth 3 (three) times daily as needed for muscle spasms. 30 tablet 0 06/13/2023   metoCLOPramide (REGLAN) 10 MG tablet Take 1 tablet (10 mg total) by mouth 4 (four) times daily as needed for nausea or vomiting. (Patient not taking: Reported on 05/04/2023) 30 tablet 2    prenatal vitamin w/FE, FA (NATACHEW) 29-1 MG CHEW chewable tablet Chew 1 tablet by mouth daily at 12 noon. (Patient not taking: Reported on 06/10/2023)       Review of Systems  Constitutional:  Negative for chills, fatigue and fever.  Eyes:  Negative for pain and visual disturbance.  Respiratory:  Negative for apnea, shortness of breath and wheezing.   Cardiovascular:  Negative for chest pain and palpitations.  Gastrointestinal:  Negative for abdominal pain, constipation, diarrhea, nausea and vomiting.  Genitourinary:  Negative for difficulty urinating, dysuria, pelvic pain, vaginal bleeding, vaginal discharge and vaginal pain.  Musculoskeletal:  Positive for arthralgias. Negative for back pain.  Skin:  Positive for color change.  Neurological:  Negative for seizures, weakness and headaches.  Psychiatric/Behavioral:  Negative for suicidal ideas.    Physical Exam   Blood pressure 117/68, pulse 66, temperature 98.6 F (37 C), resp. rate 18, height 5' 7.25" (1.708 m), weight 94.8 kg, last menstrual period 12/03/2022, SpO2 97%.  Physical Exam Vitals and nursing note reviewed.  Constitutional:      General: She is not in acute distress.    Appearance: Normal appearance.  HENT:     Head: Normocephalic.  Pulmonary:     Effort: Pulmonary effort is normal.  Musculoskeletal:        General: No swelling or tenderness.     Cervical back: Normal range of motion.     Right lower leg: No edema.     Left lower leg: No edema.  Skin:    General: Skin is warm and dry.     Findings: No bruising or erythema.   Neurological:     Mental Status: She is alert and oriented to person, place, and time.  Psychiatric:  Mood and Affect: Mood normal.    FHT: 145 bpm with moderate variability. (Appropriate for gestational; age)  Toco: Quiet.   MAU Course  Procedures Orders Placed This Encounter  Procedures   Comprehensive metabolic panel   Results for orders placed or performed during the hospital encounter of 06/17/23 (from the past 24 hour(s))  Comprehensive metabolic panel     Status: Abnormal   Collection Time: 06/17/23  1:49 PM  Result Value Ref Range   Sodium 136 135 - 145 mmol/L   Potassium 3.9 3.5 - 5.1 mmol/L   Chloride 104 98 - 111 mmol/L   CO2 20 (L) 22 - 32 mmol/L   Glucose, Bld 80 70 - 99 mg/dL   BUN 5 (L) 6 - 20 mg/dL   Creatinine, Ser 1.61 0.44 - 1.00 mg/dL   Calcium 9.1 8.9 - 09.6 mg/dL   Total Protein 6.0 (L) 6.5 - 8.1 g/dL   Albumin 2.8 (L) 3.5 - 5.0 g/dL   AST 17 15 - 41 U/L   ALT 10 0 - 44 U/L   Alkaline Phosphatase 74 38 - 126 U/L   Total Bilirubin 0.7 0.3 - 1.2 mg/dL   GFR, Estimated >04 >54 mL/min   Anion gap 12 5 - 15    MDM - CMP to check potassium levels.  - CBC drawn last week and low suspicion for Anemia in pregnancy causing leg cramping and discomfort.  - PO Tylenol and Flexeril for pain . - Positassium level normal  - Upon reassemment pain 0/10 and reports feeling much better.  - plan for discharge.  Assessment and Plan   1. Restless leg   2. [redacted] weeks gestation of pregnancy   3. Physically well but worried    - Reviewed restless legs as a normal discomfort of pregnancy.  - Encouraged the use of compression stockings, and the use of a maternity support belt. Also provided information on round ligament pain and stretches that can be done at home, to help with discomfort.  - Patient verbalized understanding , - Worsening signs and return precautions reviewed - FHT appropriate for gestational age at time of discharge.  - Patient discharged home  in stable condition and may return to MAU as needed.    Claudette Head, MSN CNM  06/17/2023, 1:10 PM

## 2023-06-17 NOTE — Discharge Instructions (Signed)
Round Ligament Massage & Stretches  Massage: Starting at the middle of your pubic bone, trace little circles in a wide U from your pubic bone to your hip bones on both sides.  Then starting just above your pubic bone, press in and down, alternating sides to create a gentle rocking of your uterus back and forth.  Move your hands up the sides of your belly and back down. Do this 3-5 times upon waking and before bed.  Stretches: Get on hands and knees and alternate arching your back deeply while inhaling, and then rounding your back while exhaling. Modified runners lunge:  - Sit on a chair with half of your bottom on the chair and half off.  - Sit up tall, plant your front foot, and stretch your other foot out behind you.  - Breathe deeply for 5 breaths and then do the other side.    PREGNANCY SUPPORT BELT: You are not alone, Seventy-five percent of women have some sort of abdominal or back pain at some point in their pregnancy. Your baby is growing at a fast pace, which means that your whole body is rapidly trying to adjust to the changes. As your uterus grows, your back may start feeling a bit under stress and this can result in back or abdominal pain that can go from mild, and therefore bearable, to severe pains that will not allow you to sit or lay down comfortably, When it comes to dealing with pregnancy-related pains and cramps, some pregnant women usually prefer natural remedies, which the market is filled with nowadays. For example, wearing a pregnancy support belt can help ease and lessen your discomfort and pain. WHAT ARE THE BENEFITS OF WEARING A PREGNANCY SUPPORT BELT? A pregnancy support belt provides support to the lower portion of the belly taking some of the weight of the growing uterus and distributing to the other parts of your body. It is designed make you comfortable and gives you extra support. Over the years, the pregnancy apparel market has been studying the needs and wants of  pregnant women and they have come up with the most comfortable pregnancy support belts that woman could ever ask for. In fact, you will no longer have to wear a stretched-out or bulky pregnancy belt that is visible underneath your clothes and makes you feel even more uncomfortable. Nowadays, a pregnancy support belt is made of comfortable and stretchy materials that will not irritate your skin but will actually make you feel at ease and you will not even notice you are wearing it. They are easy to put on and adjust during the day and can be worn at night for additional support.  BENEFITS: Relives Back pain Relieves Abdominal Muscle and Leg Pain Stabilizes the Pelvic Ring Offers a Cushioned Abdominal Lift Pad Relieves pressure on the Sciatic Nerve Within Minutes WHERE TO GET YOUR PREGNANCY BELT: [facility] [phone] @[address] [place], [place] [address]  [facility]  [address], [place], [place] [address]  [phone]  [facility]  [address] [place], [place] [address]  [phone]  [facility]  [address] , [place] [address]  [phone]  [facility]  [address], [place], [place] [address]  [phone]  

## 2023-06-17 NOTE — MAU Note (Signed)
.  Misty Jimenez is a 27 y.o. at [redacted]w[redacted]d here in MAU reporting: reports pain in her leg that starts in her calf that feels like "shedded beef" pain runs up her leg into her hips on both legs.  Has seen little bruising on her legs as well that have just appeared over the last day or so.  Also reported decreased fetal movement this morning. Reports some abd cramping as well.  LMP:  Onset of complaint: 2 days Pain score: 6-9 Vitals:   06/17/23 1151  BP: 110/65  Pulse: 92  Resp: 18  Temp: 98.6 F (37 C)     FHT:148 Lab orders placed from triage:

## 2023-06-30 ENCOUNTER — Encounter: Payer: Self-pay | Admitting: Medical

## 2023-06-30 ENCOUNTER — Ambulatory Visit (INDEPENDENT_AMBULATORY_CARE_PROVIDER_SITE_OTHER): Payer: 59 | Admitting: Medical

## 2023-06-30 VITALS — BP 111/71 | HR 82 | Wt 205.0 lb

## 2023-06-30 DIAGNOSIS — K648 Other hemorrhoids: Secondary | ICD-10-CM

## 2023-06-30 DIAGNOSIS — M25551 Pain in right hip: Secondary | ICD-10-CM

## 2023-06-30 DIAGNOSIS — F322 Major depressive disorder, single episode, severe without psychotic features: Secondary | ICD-10-CM

## 2023-06-30 DIAGNOSIS — O0930 Supervision of pregnancy with insufficient antenatal care, unspecified trimester: Secondary | ICD-10-CM

## 2023-06-30 DIAGNOSIS — R87612 Low grade squamous intraepithelial lesion on cytologic smear of cervix (LGSIL): Secondary | ICD-10-CM

## 2023-06-30 DIAGNOSIS — G479 Sleep disorder, unspecified: Secondary | ICD-10-CM

## 2023-06-30 DIAGNOSIS — O0990 Supervision of high risk pregnancy, unspecified, unspecified trimester: Secondary | ICD-10-CM

## 2023-06-30 DIAGNOSIS — O99210 Obesity complicating pregnancy, unspecified trimester: Secondary | ICD-10-CM

## 2023-06-30 DIAGNOSIS — O219 Vomiting of pregnancy, unspecified: Secondary | ICD-10-CM

## 2023-06-30 DIAGNOSIS — M25552 Pain in left hip: Secondary | ICD-10-CM

## 2023-06-30 DIAGNOSIS — F314 Bipolar disorder, current episode depressed, severe, without psychotic features: Secondary | ICD-10-CM

## 2023-06-30 DIAGNOSIS — Z3A29 29 weeks gestation of pregnancy: Secondary | ICD-10-CM

## 2023-06-30 MED ORDER — PROMETHAZINE HCL 25 MG RE SUPP
25.0000 mg | Freq: Four times a day (QID) | RECTAL | 0 refills | Status: DC | PRN
Start: 2023-06-30 — End: 2023-07-10

## 2023-06-30 NOTE — Patient Instructions (Signed)

## 2023-06-30 NOTE — Progress Notes (Signed)
PRENATAL VISIT NOTE  Subjective:  Misty Jimenez is a 27 y.o. G1P0000 at [redacted]w[redacted]d being seen today for ongoing prenatal care.  She is currently monitored for the following issues for this high-risk pregnancy and has Obesity in pregnancy, antepartum; Severe bipolar I disorder, most recent episode depressed (HCC); Severe recurrent major depression without psychotic features [facility]); MDD (major depressive disorder), severe (HCC); Posttraumatic stress disorder; Borderline personality disorder (HCC); Bipolar 2 disorder (HCC); Tobacco use; LGSIL on Pap smear of cervix; Supervision of high risk pregnancy, antepartum; and Late prenatal care affecting pregnancy, antepartum on their problem list.  Patient reports fatigue, nausea, vomiting, and hemorrhoid, hip pain bilaterally .  Contractions: Not present. Vag. Bleeding: None.  Movement: Present. Denies leaking of fluid.   The following portions of the patient's history were reviewed and updated as appropriate: allergies, current medications, past family history, past medical history, past social history, past surgical history and problem list.   Objective:   Vitals:   06/30/23 1000  BP: 111/71  Pulse: 82  Weight: 205 lb (93 kg)    Fetal Status: Fetal Heart Rate (bpm): 140 Fundal Height: 30 cm Movement: Present     General:  Alert, oriented and cooperative. Patient is in no acute distress.  Skin: Skin is warm and dry. No rash noted.   Cardiovascular: Normal heart rate noted  Respiratory: Normal respiratory effort, no problems with respiration noted  Abdomen: Soft, gravid, appropriate for gestational age.  Pain/Pressure: Present     Pelvic: Cervical exam deferred        Extremities: Normal range of motion.  Edema: Trace  Mental Status: Normal mood and affect. Normal behavior. Normal judgment and thought content.   Assessment and Plan:  Pregnancy: G1P0000 at [redacted]w[redacted]d 1. Supervision of high risk pregnancy, antepartum - Reviewed normal third  trimester labs from last visit   2. Late prenatal care affecting pregnancy, antepartum  3. MDD (major depressive disorder), severe (HCC) - Ambulatory referral to Integrated Behavior[facility] - Worsening of eating disorder symptoms for the last few weeks, vomiting after all intake  4. Severe bipolar I disorder, most recent episode depressed (HCC) - Ambulatory referral to Integrated Behavior[facility]  5. LGSIL on Pap smear of cervix - Repeat 1 year   6. Obesity in pregnancy, antepartum  7. Nausea and vomiting during pregnancy - promethazine (PHENERGAN) 25 MG suppository; Place 1 suppository (25 mg total) rectally every 6 (six) hours as needed for nausea or vomiting.  Dispense: 12 each; Refill: 0 - Discussed protein shakes as a good source of protein without as much intake   8. Other hemorrhoids - List of OTC medications given for symptoms relief  9. Bilateral hip pain - Discussed hydrotherapy, heat, and Flexeril as prescribed   10. Difficulty sleeping - List of OTC meds given, consider Unisom or Benadryl, do not take with Phenergan   11. [redacted] weeks gestation of pregnancy  Preterm labor symptoms and general obstetric precautions including but not limited to vaginal bleeding, contractions, leaking of fluid and fetal movement were reviewed in detail with the patient. Please refer to After Visit Summary for other counseling recommendations.   Return in about 2 weeks (around 07/14/2023) for LOB, In-Person, any provider.  Future Appointments  Date Time Provider Department Center  07/15/2023  9:15 AM Levie Heritage, DO [facility] None  07/21/2023  3:30 PM [facility]  07/30/2023  9:55 AM Levie Heritage, DO [facility] None  08/12/2023  9:55 AM Levie Heritage, DO  [facility] None  08/26/2023  9:55 AM Adrian Blackwater, Rhona Raider, DO [facility] None    Vonzella Nipple, PA-C

## 2023-07-10 ENCOUNTER — Ambulatory Visit: Payer: Self-pay

## 2023-07-10 ENCOUNTER — Inpatient Hospital Stay (HOSPITAL_COMMUNITY)
Admission: AD | Admit: 2023-07-10 | Discharge: 2023-07-10 | Disposition: A | Discharge status: Home / Self Care | Payer: 59 | Attending: Obstetrics and Gynecology | Admitting: Obstetrics and Gynecology

## 2023-07-10 ENCOUNTER — Inpatient Hospital Stay (HOSPITAL_COMMUNITY)
Admission: AD | Admit: 2023-07-10 | Discharge: 2023-07-10 | Disposition: A | Discharge status: Home / Self Care | Payer: 59 | Source: Home / Self Care | Attending: Obstetrics and Gynecology | Admitting: Obstetrics and Gynecology

## 2023-07-10 ENCOUNTER — Other Ambulatory Visit: Payer: Self-pay

## 2023-07-10 ENCOUNTER — Encounter (HOSPITAL_COMMUNITY): Payer: Self-pay | Admitting: Obstetrics and Gynecology

## 2023-07-10 DIAGNOSIS — O99613 Diseases of the digestive system complicating pregnancy, third trimester: Secondary | ICD-10-CM | POA: Diagnosis not present

## 2023-07-10 DIAGNOSIS — Z0371 Encounter for suspected problem with amniotic cavity and membrane ruled out: Secondary | ICD-10-CM | POA: Insufficient documentation

## 2023-07-10 DIAGNOSIS — R102 Pelvic and perineal pain: Secondary | ICD-10-CM | POA: Insufficient documentation

## 2023-07-10 DIAGNOSIS — F17210 Nicotine dependence, cigarettes, uncomplicated: Secondary | ICD-10-CM | POA: Insufficient documentation

## 2023-07-10 DIAGNOSIS — O26893 Other specified pregnancy related conditions, third trimester: Secondary | ICD-10-CM | POA: Insufficient documentation

## 2023-07-10 DIAGNOSIS — Z3689 Encounter for other specified antenatal screening: Secondary | ICD-10-CM

## 2023-07-10 DIAGNOSIS — F419 Anxiety disorder, unspecified: Secondary | ICD-10-CM | POA: Insufficient documentation

## 2023-07-10 DIAGNOSIS — O99333 Smoking (tobacco) complicating pregnancy, third trimester: Secondary | ICD-10-CM | POA: Insufficient documentation

## 2023-07-10 DIAGNOSIS — O99213 Obesity complicating pregnancy, third trimester: Secondary | ICD-10-CM | POA: Insufficient documentation

## 2023-07-10 DIAGNOSIS — Z3A31 31 weeks gestation of pregnancy: Secondary | ICD-10-CM | POA: Insufficient documentation

## 2023-07-10 DIAGNOSIS — R519 Headache, unspecified: Secondary | ICD-10-CM | POA: Insufficient documentation

## 2023-07-10 DIAGNOSIS — F32A Depression, unspecified: Secondary | ICD-10-CM | POA: Diagnosis not present

## 2023-07-10 DIAGNOSIS — O99343 Other mental disorders complicating pregnancy, third trimester: Secondary | ICD-10-CM | POA: Diagnosis not present

## 2023-07-10 DIAGNOSIS — O99353 Diseases of the nervous system complicating pregnancy, third trimester: Secondary | ICD-10-CM | POA: Diagnosis not present

## 2023-07-10 DIAGNOSIS — N898 Other specified noninflammatory disorders of vagina: Secondary | ICD-10-CM

## 2023-07-10 LAB — URINALYSIS, ROUTINE W REFLEX MICROSCOPIC
Bilirubin Urine: NEGATIVE
Glucose, UA: NEGATIVE mg/dL
Hgb urine dipstick: NEGATIVE
Ketones, ur: NEGATIVE mg/dL
Leukocytes,Ua: NEGATIVE
Nitrite: NEGATIVE
Protein, ur: NEGATIVE mg/dL
Specific Gravity, Urine: 1.02 (ref 1.005–1.030)
pH: 7 (ref 5.0–8.0)

## 2023-07-10 LAB — WET PREP, GENITAL
Clue Cells Wet Prep HPF POC: NONE SEEN
Sperm: NONE SEEN
Trich, Wet Prep: NONE SEEN
WBC, Wet Prep HPF POC: <10 (ref <10)
Yeast Wet Prep HPF POC: NONE SEEN

## 2023-07-10 LAB — COMPREHENSIVE METABOLIC PANEL
ALT: 32 U/L (ref 0–44)
AST: 39 U/L (ref 15–41)
Albumin: 2.7 g/dL — ABNORMAL LOW (ref 3.5–5.0)
Alkaline Phosphatase: 96 U/L (ref 38–126)
Anion gap: 9 (ref 5–15)
BUN: 5 mg/dL — ABNORMAL LOW (ref 6–20)
CO2: 18 mmol/L — ABNORMAL LOW (ref 22–32)
Calcium: 8.3 mg/dL — ABNORMAL LOW (ref 8.9–10.3)
Chloride: 106 mmol/L (ref 98–111)
Creatinine, Ser: 0.58 mg/dL (ref 0.44–1.00)
GFR, Estimated: >60 mL/min (ref >60)
Glucose, Bld: 75 mg/dL (ref 70–99)
Potassium: 3.6 mmol/L (ref 3.5–5.1)
Sodium: 133 mmol/L — ABNORMAL LOW (ref 135–145)
Total Bilirubin: 0.6 mg/dL (ref 0.3–1.2)
Total Protein: 5.8 g/dL — ABNORMAL LOW (ref 6.5–8.1)

## 2023-07-10 LAB — CBC WITH DIFFERENTIAL/PLATELET
Abs Immature Granulocytes: 0.07 10*3/uL (ref 0.00–0.07)
Basophils Absolute: 0 10*3/uL (ref 0.0–0.1)
Basophils Relative: 0 %
Eosinophils Absolute: 0.1 10*3/uL (ref 0.0–0.5)
Eosinophils Relative: 0 %
HCT: 36.7 % (ref 36.0–46.0)
Hemoglobin: 12.3 g/dL (ref 12.0–15.0)
Immature Granulocytes: 1 %
Lymphocytes Relative: 17 %
Lymphs Abs: 2.6 10*3/uL (ref 0.7–4.0)
MCH: 30.8 pg (ref 26.0–34.0)
MCHC: 33.5 g/dL (ref 30.0–36.0)
MCV: 92 fL (ref 80.0–100.0)
Monocytes Absolute: 0.9 10*3/uL (ref 0.1–1.0)
Monocytes Relative: 6 %
Neutro Abs: 11.8 10*3/uL — ABNORMAL HIGH (ref 1.7–7.7)
Neutrophils Relative %: 76 %
Platelets: 314 10*3/uL (ref 150–400)
RBC: 3.99 MIL/uL (ref 3.87–5.11)
RDW: 12.5 % (ref 11.5–15.5)
WBC: 15.4 10*3/uL — ABNORMAL HIGH (ref 4.0–10.5)
nRBC: 0 % (ref 0.0–0.2)

## 2023-07-10 LAB — RUPTURE OF MEMBRANE (ROM)PLUS: Rom Plus: NEGATIVE

## 2023-07-10 MED ORDER — ONDANSETRON HCL 4 MG/2ML IJ SOLN
4.0000 mg | Freq: Once | INTRAMUSCULAR | Status: AC
  Administered 2023-07-10: 4 mg via INTRAVENOUS
  Filled 2023-07-10: qty 2

## 2023-07-10 MED ORDER — DIPHENHYDRAMINE HCL 50 MG/ML IJ SOLN
25.0000 mg | Freq: Once | INTRAMUSCULAR | Status: AC
  Administered 2023-07-10: 25 mg via INTRAVENOUS
  Filled 2023-07-10: qty 1

## 2023-07-10 MED ORDER — FAMOTIDINE IN NACL 20-0.9 MG/50ML-% IV SOLN
20.0000 mg | Freq: Once | INTRAVENOUS | Status: AC
  Administered 2023-07-10: 20 mg via INTRAVENOUS
  Filled 2023-07-10: qty 50

## 2023-07-10 MED ORDER — LACTATED RINGERS IV BOLUS
1000.0000 mL | Freq: Once | INTRAVENOUS | Status: AC
  Administered 2023-07-10: 1000 mL via INTRAVENOUS

## 2023-07-10 MED ORDER — DEXAMETHASONE SODIUM PHOSPHATE 10 MG/ML IJ SOLN
10.0000 mg | Freq: Once | INTRAMUSCULAR | Status: AC
  Administered 2023-07-10: 10 mg via INTRAVENOUS
  Filled 2023-07-10: qty 1

## 2023-07-10 MED ORDER — SUMATRIPTAN 20 MG/ACT NA SOLN
20.0000 mg | Freq: Once | NASAL | Status: AC
  Administered 2023-07-10: 20 mg via NASAL
  Filled 2023-07-10: qty 1

## 2023-07-10 MED ORDER — ACETAMINOPHEN-CAFFEINE 500-65 MG PO TABS
2.0000 | ORAL_TABLET | Freq: Once | ORAL | Status: AC
  Administered 2023-07-10: 2 via ORAL
  Filled 2023-07-10: qty 2

## 2023-07-10 MED ORDER — PROMETHAZINE HCL 25 MG/ML IJ SOLN
25.0000 mg | Freq: Once | INTRAMUSCULAR | Status: AC
  Administered 2023-07-10: 25 mg via INTRAMUSCULAR
  Filled 2023-07-10: qty 1

## 2023-07-10 NOTE — MAU Note (Signed)
Misty Jimenez is a 27 y.o. at [redacted]w[redacted]d here in MAU reporting: she's having cramping and pelvic pain.  Reports having two gushes of fluid, one today and the other was 3-4 days ago.  Reports fluid was light pink, clear.  Denies VB.  Endorses +FM. LMP: NA Onset of complaint: 3-4 days ago Pain score: 7 Vitals:   07/10/23 1235  BP: 122/62  Pulse: 83  Resp: 20  Temp: 98 F (36.7 C)  SpO2: 97%     [id number] secondary to maternal apparel Lab orders placed from triage:

## 2023-07-10 NOTE — MAU Note (Signed)
Misty Jimenez is a 27 y.o. at [redacted]w[redacted]d here in MAU reporting: she been leaking fluid for 3-4 days.  Also reports she has N/V/D since Saturday.  Also reports she had a HA since last night.  Denies VB, reports light pink discharge.  Endorses +FM. LMP: NA  Onset of complaint: Saturday Pain score: 10 Vitals:   07/10/23 1749  BP: 116/74  Pulse: (!) 103  Resp: 16  Temp: 98.4 F (36.9 C)  SpO2: 97%     [id number] secondary maternal apparel Lab orders placed from triage:  None

## 2023-07-10 NOTE — Telephone Encounter (Signed)
Chief Complaint: worried PROM and pelvic pain Symptoms: back pain, vomiting, diarrhea Frequency: 4 days ago and today per chart review  Disposition: [x] ED /[] Urgent Care (no appt availability in office) / [] Appointment(In office/virtual)/ []  [facility]/ [] Home Care/ [x] Refused Recommended Disposition /[] [facility]/ []  Follow-up with PCP Additional Notes: pt stated that she is not going back to MAU"they didn't take me seriously." Called OBGYN and spoke to answering service and provided pt information, merged calls and service was going to connect her to nursing advice line. I ended call. Reason for Disposition  Leakage of fluid from vagina  Answer Assessment - Initial Assessment Questions 1. LOCATION: "Where does it hurt?"      Pelvic and lower back  2. RADIATION: "Does the pain shoot anywhere else?" (e.g., lower back, groin, thighs)     Lower back       4. SUDDEN: "Gradual or sudden onset?"     Comes and goes  5. PATTERN "Does the pain come and go, or is it constant?"    - If constant: "Is it getting better, staying the same, or worsening?"      (Note: Constant means the pain never goes away completely; most serious pain is constant and gets worse over time)     - If intermittent: "How long does it last?" "Do you have pain now?"     (Note: Intermittent means the pain goes away completely between bouts)     Comes and goes 6. SEVERITY: "How bad is the pain?"  (e.g., Scale 1-10; mild, moderate, or severe)   - MILD (1-3): doesn't interfere with normal activities, area soft and not tender to touch    - MODERATE (4-7): interferes with normal activities or awakens from sleep, abdomen tender to touch    - SEVERE (8-10): excruciating pain, doubled over, unable to do any normal activities      8 /10  8. CAUSE: "What do you think is causing the pelvic pain?"     Loss of amniotic fluid concern  10. OTHER SYMPTOMS: "Has there been any other symptoms?" (e.g., fever,  constipation, diarrhea, urine problems, vaginal bleeding, vaginal discharge, or vomiting?"       Diarrhea, nausea, wetness, vomiting, headache .    11. PREGNANCY: "Is there any chance you are pregnant?" "When was your last menstrual period?"       31 weeks 2/days  Answer Assessment - Initial Assessment Questions 1. LOCATION: "Where does it hurt?"   Pelvic area  2. RADIATION: "Does the pain shoot anywhere else?" (e.g., chest, back)     Back  3. ONSET: "When did the pain begin?" (Minutes, hours or days ago)      *No Answer* 4. SUDDEN: "Gradual or sudden onset?"     sudden 5. PATTERN: "Does the pain come and go, or has it been constant since it started?"      Comes and goes 6. SEVERITY: "How bad is the pain?" "What does it keep you from doing?"  (e.g., Scale 1-10; mild, moderate, or severe)   - MILD (1-3): Doesn't interfere with normal activities, abdomen soft and not tender to touch.    - MODERATE (4-7): Interferes with normal activities or awakens from sleep, abdomen tender to touch.    - SEVERE (8-10): Excruciating pain, doubled over, unable to do any normal activities.     severe  8. CAUSE: "What do you think is causing the stomach pain?     Worried PROM 11. OTHER SYMPTOMS: "Do  you have any other symptoms?" (e.g., back pain, contractions, diarrhea, fever, headache, urination pain, vaginal bleeding, vaginal discharge, vomiting)       Diarrhea, vomiting,wet underwear  Protocols used: Pelvic Pain - Female-A-AH, Pregnancy - Abdominal Pain Greater Than [redacted] Weeks EGA-A-AH

## 2023-07-10 NOTE — MAU Provider Note (Cosign Needed Addendum)
Chief Complaint:  Rupture of Membranes, Headache, Diarrhea, Nausea, and Emesis  HPI   Event Date/Time   First Provider Initiated Contact with Patient 07/10/23 1837     Misty Jimenez is a 27 y.o. G1P0000 at [redacted]w[redacted]d who presents to maternity admissions reporting nausea/vomiting since last night (vomited once last night and once this morning, hasn't eaten since last night). Says she wakes up every morning feeling like it is sitting in her chest and throat. Has also had a few episodes of diarrhea since yesterday. Endorses two gushes of light pink fluid on Saturday then frequent wetness in her underwear, does say she has some incontinence but feels like this is different.  Has a headache with light sensitivity, has had it all day. Has not taken anything for her complaints. No other physical complaints.   Pregnancy Course: Receives [facility] at [facility], was seen earlier this afternoon for suspected ROM. Provider did speculum exam and found no pooling, wet prep negative and GC pending. Other complaints not reported to previous provider.   Past Medical History:  Diagnosis Date   Abdominal pain, chronic, right lower quadrant 08/03/2013   Abdominal pain, recurrent    With Headache   Acute pyelonephritis 05/07/2016   kidney surgery at 88 months of age   ADHD (attention deficit hyperactivity disorder)    Allergy    Anxiety    Bulimia 04/17/2023   Depression    Endometriosis    Family history of adverse reaction to anesthesia    "grandmother gets PONV"   GE reflux 12/16/2011   Hyperlipidemia    Kidney disease 05/07/2016   LGSIL on Pap smear of cervix 04/13/2023   03/2023 LSIL - PAP in 1 year     Obesity 05/07/2016   PCOS (polycystic ovarian syndrome)    Preventative health care 11/04/2016   Reflux, vesicoureteral    Respiratory illness with fever 12/27/2019   Wrist pain, left 11/04/2016   OB History  Gravida Para Term Preterm AB Living  1 0 0 0 0 0  SAB IAB Ectopic Multiple Live Births  0 0 0  0 0    # Outcome Date GA Lbr Len/2nd Weight Sex Type Anes PTL Lv  1 Current            Past Surgical History:  Procedure Laterality Date   KIDNEY SURGERY     As a small child   TYMPANOSTOMY TUBE PLACEMENT     URETER SURGERY     WRIST SURGERY Left 05/2016   10 pins and 2 plates   Family History  Problem Relation Age of Onset   Kidney disease Mother    Alcohol abuse Father        and drug use, heroin, cocaine, marijuana   Migraines Maternal Aunt    Cancer Paternal Aunt        colon in 58s deceased   Asthma Maternal Grandmother    Diabetes Maternal Grandfather    Hyperlipidemia Other    Hypertension Other    Depression Other    Stroke Neg Hx    Social History   Tobacco Use   Smoking status: Some Days    Current packs/day: 0.00    Average packs/day: 0.3 packs/day for 2.0 years (0.5 ttl pk-yrs)    Types: Cigarettes    Start date: 10/27/2013    Last attempt to quit: 10/28/2015    Years since quitting: 7.7   Smokeless tobacco: Former    Types: Chew   Tobacco comments:  Pt declines information  Vaping Use   Vaping status: Some Days   Substances: THC  Substance Use Topics   Alcohol use: Not Currently    Alcohol/week: 2.0 - 5.0 standard drinks of alcohol    Types: 2 - 5 Cans of beer per week    Comment: 3-4 x week   Drug use: Not Currently    Types: Marijuana   Allergies  Allergen Reactions   Sulfa Antibiotics Hives and Rash   Sulfasalazine Hives   Amphetamines     Reports "different parts of her body melt and causes hallucinations."   Other     Fiberglass cast caused rash and hives   Monosodium Glutamate Nausea And Vomiting, Rash and Other (See Comments)    Headache and dizziness   Sulfa Drugs Cross Reactors Rash   Medications Prior to Admission  Medication Sig Dispense Refill Last Dose   aspirin EC 81 MG tablet Take 1 tablet (81 mg total) by mouth daily. Take after 12 weeks for prevention of preeclampsia later in pregnancy 300 tablet 2 07/09/2023    cyclobenzaprine (FLEXERIL) 5 MG tablet Take 1 tablet (5 mg total) by mouth 3 (three) times daily as needed for muscle spasms. 30 tablet 0 Past Month   FLUoxetine (PROZAC) 20 MG capsule Take 1 capsule (20 mg total) by mouth daily. 30 capsule 3 07/09/2023   cyclobenzaprine (FLEXERIL) 5 MG tablet Take 1 tablet (5 mg total) by mouth 3 (three) times daily as needed for muscle spasms. 20 tablet 0    metoCLOPramide (REGLAN) 10 MG tablet Take 1 tablet (10 mg total) by mouth 4 (four) times daily as needed for nausea or vomiting. (Patient not taking: Reported on 05/04/2023) 30 tablet 2    prenatal vitamin w/FE, FA (NATACHEW) 29-1 MG CHEW chewable tablet Chew 1 tablet by mouth daily at 12 noon. (Patient not taking: Reported on 06/10/2023)      promethazine (PHENERGAN) 25 MG suppository Place 1 suppository (25 mg total) rectally every 6 (six) hours as needed for nausea or vomiting. 12 each 0    I have reviewed patient's Past Medical Hx, Surgical Hx, Family Hx, Social Hx, medications and allergies.   ROS  Pertinent items noted in HPI and remainder of comprehensive ROS otherwise negative.   PHYSICAL EXAM  Patient Vitals for the past 24 hrs:  BP Temp Temp src Pulse Resp SpO2 Height Weight  07/10/23 1749 116/74 98.4 F (36.9 C) Oral (!) 103 16 97 % -- --  07/10/23 1742 -- -- -- -- -- -- 5' 7.25" (1.708 m) 207 lb 3.2 oz (94 kg)   Constitutional: Well-developed, well-nourished female in no mild emotional distress (feeling anxious) Cardiovascular: normal rate & rhythm, warm and well-perfused Respiratory: normal effort, no problems with respiration noted GI: Abd soft, non-tender, non-distended MS: Extremities nontender, no edema, normal ROM Neurologic: Alert and oriented x 4.  GU: no CVA tenderness Pelvic: exam deferred, just had one - ROM+ ordered  Fetal Tracing: reactive Baseline: 145 Variability: moderate Accelerations: 15x15 Decelerations: occasional variable Toco: relaxed   Labs: Results for orders  placed or performed during the hospital encounter of 07/10/23 (from the past 24 hour(s))  Rupture of Membrane (ROM) Plus     Status: None   Collection Time: 07/10/23  6:31 PM  Result Value Ref Range   Rom Plus NEGATIVE    Imaging:  No results found.  MDM & MAU COURSE  MDM: High  MAU Course: Orders Placed This Encounter  Procedures   Rupture  of Membrane (ROM) Plus   Meds ordered this encounter  Medications   lactated ringers bolus 1,000 mL   ondansetron (ZOFRAN) injection 4 mg   famotidine (PEPCID) IVPB 20 mg premix   acetaminophen-caffeine (EXCEDRIN TENSION HEADACHE) 500-65 MG per tablet 2 tablet   LR bolus with zofran and pepcid ordered, as well as Excedrin-Tension to be given after anti-nausea meds.   RN noted that pt vomited in the sink and peed on the floor. Complains of continued pain/nausea/headache. Says phenergan usually works for nausea. Ordered phenergan IV, decadron and benadryl as a headache cocktail. Care turned over to [facility], [place].  Edd Arbour, CNM, MSN, IBCLC 07/10/23 at 8:23 PM   ASSESSMENT  No diagnosis found.  PLAN  Reassessment (9:06 PM) -Patient reports headache has improved as it was a 10/10 and now 8/10.  -She states it is better than what it was, but is agreeable to additional treatment.  -Patient also reports  -She reports that she has been experiencing pelvic floor and back pain.  Reassured that pelvic and back pain is anticipated pregnancy complaint and recommend maternity belt. Of note, evaluation this morning was negative.  -Discussed treatment with imitrex for c/o HA and patient agreeable.    Reassessment (9:54 PM) -Patient reports Ha improved with imitrex and now 4/10. -Requesting discharge.  -Nurse to give precautions. -Encouraged to call primary office or return to MAU if symptoms worsen or with the onset of new symptoms. -Discharged to home in improved condition.  Cherre Robins MSN, CNM Advanced Practice Provider,  [facility] [employer]

## 2023-07-10 NOTE — MAU Provider Note (Signed)
History     CSN: [id number]  Arrival date and time: 07/10/23 1205   None     Chief Complaint  Patient presents with   Rupture of Membranes   Abdominal Pain   Pelvic Pain   HPI Misty Jimenez is a 27 y.o. year old G26P0000 female at [redacted]w[redacted]d weeks gestation who presents to MAU reporting cramping and pelvic pain since Saturday. She describes the pain as starting out as intermittent and became constant today. She rates the pain a 7/10. She also reports 2 gushes of clear pink fluid on Saturday just before the cramping and pelvic pain started. She does have instances of leaking of urine, but she seems to think the gushes are isolated from those occasions. She denies any recent SI; "it was definitely a long while before the gushes happened." She reports good (+) FM today. She receives [facility] with [facility]; next appt is 07/15/2023. Her grandmother is present and contributing to the history taking.    OB History     Gravida  1   Para  0   Term  0   Preterm  0   AB  0   Living  0      SAB  0   IAB  0   Ectopic  0   Multiple  0   Live Births  0           Past Medical History:  Diagnosis Date   Abdominal pain, chronic, right lower quadrant 08/03/2013   Abdominal pain, recurrent    With Headache   Acute pyelonephritis 05/07/2016   kidney surgery at 71 months of age   ADHD (attention deficit hyperactivity disorder)    Allergy    Anxiety    Bulimia 04/17/2023   Depression    Endometriosis    Family history of adverse reaction to anesthesia    "grandmother gets PONV"   GE reflux 12/16/2011   Hyperlipidemia    Kidney disease 05/07/2016   LGSIL on Pap smear of cervix 04/13/2023   03/2023 LSIL - PAP in 1 year     Obesity 05/07/2016   PCOS (polycystic ovarian syndrome)    Preventative health care 11/04/2016   Reflux, vesicoureteral    Respiratory illness with fever 12/27/2019   Wrist pain, left 11/04/2016    Past Surgical History:  Procedure Laterality Date    KIDNEY SURGERY     As a small child   TYMPANOSTOMY TUBE PLACEMENT     URETER SURGERY     WRIST SURGERY Left 05/2016   10 pins and 2 plates    Family History  Problem Relation Age of Onset   Kidney disease Mother    Alcohol abuse Father        and drug use, heroin, cocaine, marijuana   Migraines Maternal Aunt    Cancer Paternal Aunt        colon in 56s deceased   Asthma Maternal Grandmother    Diabetes Maternal Grandfather    Hyperlipidemia Other    Hypertension Other    Depression Other    Stroke Neg Hx     Social History   Tobacco Use   Smoking status: Some Days    Current packs/day: 0.00    Average packs/day: 0.3 packs/day for 2.0 years (0.5 ttl pk-yrs)    Types: Cigarettes    Start date: 10/27/2013    Last attempt to quit: 10/28/2015    Years since quitting: 7.7   Smokeless tobacco: Former  Types: Chew   Tobacco comments:    Pt declines information  Vaping Use   Vaping status: Some Days   Substances: THC  Substance Use Topics   Alcohol use: Not Currently    Alcohol/week: 2.0 - 5.0 standard drinks of alcohol    Types: 2 - 5 Cans of beer per week    Comment: 3-4 x week   Drug use: Not Currently    Types: Marijuana    Allergies:  Allergies  Allergen Reactions   Sulfa Antibiotics Hives and Rash   Sulfasalazine Hives   Amphetamines     Reports "different parts of her body melt and causes hallucinations."   Other     Fiberglass cast caused rash and hives   Monosodium Glutamate Nausea And Vomiting, Rash and Other (See Comments)    Headache and dizziness   Sulfa Drugs Cross Reactors Rash    Medications Prior to Admission  Medication Sig Dispense Refill Last Dose   aspirin EC 81 MG tablet Take 1 tablet (81 mg total) by mouth daily. Take after 12 weeks for prevention of preeclampsia later in pregnancy 300 tablet 2 07/08/2023   cyclobenzaprine (FLEXERIL) 5 MG tablet Take 1 tablet (5 mg total) by mouth 3 (three) times daily as needed for muscle spasms. 30 tablet  0    cyclobenzaprine (FLEXERIL) 5 MG tablet Take 1 tablet (5 mg total) by mouth 3 (three) times daily as needed for muscle spasms. 20 tablet 0    FLUoxetine (PROZAC) 20 MG capsule Take 1 capsule (20 mg total) by mouth daily. 30 capsule 3 07/10/2023   metoCLOPramide (REGLAN) 10 MG tablet Take 1 tablet (10 mg total) by mouth 4 (four) times daily as needed for nausea or vomiting. (Patient not taking: Reported on 05/04/2023) 30 tablet 2    prenatal vitamin w/FE, FA (NATACHEW) 29-1 MG CHEW chewable tablet Chew 1 tablet by mouth daily at 12 noon. (Patient not taking: Reported on 06/10/2023)      promethazine (PHENERGAN) 25 MG suppository Place 1 suppository (25 mg total) rectally every 6 (six) hours as needed for nausea or vomiting. 12 each 0     Review of Systems  Constitutional: Negative.   HENT: Negative.    Eyes: Negative.   Respiratory: Negative.    Cardiovascular: Negative.   Gastrointestinal: Negative.   Endocrine: Negative.   Genitourinary:  Positive for pelvic pain (cramping and pelvic pain) and vaginal discharge.  Musculoskeletal: Negative.   Skin: Negative.   Allergic/Immunologic: Negative.   Neurological: Negative.   Hematological: Negative.   Psychiatric/Behavioral: Negative.     Physical Exam   Blood pressure 136/81, pulse 70, temperature 98.2 F (36.8 C), temperature source Oral, resp. rate 18, height 5' 7.25" (1.708 m), weight 94.2 kg, last menstrual period 12/03/2022, SpO2 100%.  Physical Exam Vitals and nursing note reviewed. Exam conducted with a chaperone present.  Constitutional:      Appearance: Normal appearance. She is obese.  Cardiovascular:     Rate and Rhythm: Normal rate.  Pulmonary:     Effort: Pulmonary effort is normal.  Abdominal:     Palpations: Abdomen is soft.  Genitourinary:    General: Normal vulva.     Comments: Pelvic exam: External genitalia normal, SE: vaginal walls pink and well rugated, cervix is smooth, pink, no lesions, scant amt of thick,  white vaginal d/c -- WP, GC/CT, fFN done, cervix visually closed, Uterus is non-tender, S=D, no CMT or friability, no adnexal tenderness.  Dilation: Closed Effacement (%): Thick  Cervical Position: Posterior Presentation: Undeterminable Exam by: Carloyn Jaeger, CNM  Musculoskeletal:        General: Normal range of motion.  Skin:    General: Skin is warm and dry.  Neurological:     Mental Status: She is alert and oriented to person, place, and time.  Psychiatric:        Mood and Affect: Mood normal.        Behavior: Behavior normal.        Thought Content: Thought content normal.        Judgment: Judgment normal.    REACTIVE NST - FHR: 130 bpm / moderate variability / accels present / decels absent / TOCO: UI noted with 1 UC MAU Course  Procedures  MDM Wet Prep  GC/CT -- Results pending fFN -- not sent d/t cervix being closed  Results for orders placed or performed during the hospital encounter of 07/10/23 (from the past 24 hour(s))  Wet prep, genital     Status: None   Collection Time: 07/10/23  1:29 PM  Result Value Ref Range   Yeast Wet Prep HPF POC NONE SEEN NONE SEEN   Trich, Wet Prep NONE SEEN NONE SEEN   Clue Cells Wet Prep HPF POC NONE SEEN NONE SEEN   WBC, Wet Prep HPF POC <10 <10   Sperm NONE SEEN   Urinalysis, Routine w reflex microscopic -Urine, Clean Catch     Status: None   Collection Time: 07/10/23  3:05 PM  Result Value Ref Range   Color, Urine YELLOW YELLOW   APPearance CLEAR CLEAR   Specific Gravity, Urine 1.020 1.005 - 1.030   pH 7.0 5.0 - 8.0   Glucose, UA NEGATIVE NEGATIVE mg/dL   Hgb urine dipstick NEGATIVE NEGATIVE   Bilirubin Urine NEGATIVE NEGATIVE   Ketones, ur NEGATIVE NEGATIVE mg/dL   Protein, ur NEGATIVE NEGATIVE mg/dL   Nitrite NEGATIVE NEGATIVE   Leukocytes,Ua NEGATIVE NEGATIVE    Assessment and Plan  1. Pelvic pain affecting pregnancy in third trimester, antepartum - Information provided on pelvic pain in pregnancy   2. Vaginal  discharge during pregnancy in third trimester - Reassurance given that vaginal discharge is not amniotic fluid and the leaking could possibly be from urinary incontinence  3. [redacted] weeks gestation of pregnancy   - Discharge patient - Keep scheduled appt with [facility] on 07/15/2023 - Patient verbalized an understanding of the plan of care and agrees.   Raelyn Mora, CNM 07/10/2023, 1:11 PM

## 2023-07-12 ENCOUNTER — Inpatient Hospital Stay (HOSPITAL_COMMUNITY)
Admission: AD | Admit: 2023-07-12 | Discharge: 2023-07-12 | Disposition: A | Discharge status: Home / Self Care | Payer: 59 | Attending: Obstetrics & Gynecology | Admitting: Obstetrics & Gynecology

## 2023-07-12 ENCOUNTER — Encounter (HOSPITAL_COMMUNITY): Payer: Self-pay | Admitting: Obstetrics & Gynecology

## 2023-07-12 ENCOUNTER — Other Ambulatory Visit: Payer: Self-pay | Admitting: Advanced Practice Midwife

## 2023-07-12 DIAGNOSIS — R300 Dysuria: Secondary | ICD-10-CM | POA: Diagnosis not present

## 2023-07-12 DIAGNOSIS — R519 Headache, unspecified: Secondary | ICD-10-CM | POA: Insufficient documentation

## 2023-07-12 DIAGNOSIS — O212 Late vomiting of pregnancy: Secondary | ICD-10-CM | POA: Diagnosis not present

## 2023-07-12 DIAGNOSIS — Z3A31 31 weeks gestation of pregnancy: Secondary | ICD-10-CM | POA: Insufficient documentation

## 2023-07-12 DIAGNOSIS — Z3689 Encounter for other specified antenatal screening: Secondary | ICD-10-CM

## 2023-07-12 DIAGNOSIS — O99891 Other specified diseases and conditions complicating pregnancy: Secondary | ICD-10-CM | POA: Insufficient documentation

## 2023-07-12 DIAGNOSIS — Z1152 Encounter for screening for COVID-19: Secondary | ICD-10-CM | POA: Diagnosis not present

## 2023-07-12 DIAGNOSIS — O26893 Other specified pregnancy related conditions, third trimester: Secondary | ICD-10-CM

## 2023-07-12 DIAGNOSIS — R102 Pelvic and perineal pain: Secondary | ICD-10-CM | POA: Diagnosis not present

## 2023-07-12 DIAGNOSIS — O219 Vomiting of pregnancy, unspecified: Secondary | ICD-10-CM

## 2023-07-12 DIAGNOSIS — R197 Diarrhea, unspecified: Secondary | ICD-10-CM | POA: Diagnosis not present

## 2023-07-12 DIAGNOSIS — O26899 Other specified pregnancy related conditions, unspecified trimester: Secondary | ICD-10-CM

## 2023-07-12 LAB — URINALYSIS, ROUTINE W REFLEX MICROSCOPIC
Bilirubin Urine: NEGATIVE
Glucose, UA: NEGATIVE mg/dL
Hgb urine dipstick: NEGATIVE
Ketones, ur: NEGATIVE mg/dL
Leukocytes,Ua: NEGATIVE
Nitrite: NEGATIVE
Protein, ur: NEGATIVE mg/dL
Specific Gravity, Urine: 1.02 (ref 1.005–1.030)
pH: 6.5 (ref 5.0–8.0)

## 2023-07-12 LAB — RESPIRATORY PANEL BY PCR

## 2023-07-12 LAB — SARS CORONAVIRUS 2 BY RT PCR: SARS Coronavirus 2 by RT PCR: NEGATIVE

## 2023-07-12 LAB — RUPTURE OF MEMBRANE (ROM)PLUS: Rom Plus: NEGATIVE

## 2023-07-12 MED ORDER — ONDANSETRON HCL 4 MG/2ML IJ SOLN
4.0000 mg | Freq: Once | INTRAMUSCULAR | Status: AC
  Administered 2023-07-12: 4 mg via INTRAVENOUS
  Filled 2023-07-12: qty 2

## 2023-07-12 MED ORDER — MORPHINE SULFATE (PF) 4 MG/ML IV SOLN
4.0000 mg | Freq: Once | INTRAVENOUS | Status: AC
  Administered 2023-07-12: 4 mg via INTRAVENOUS
  Filled 2023-07-12: qty 1

## 2023-07-12 MED ORDER — SUMATRIPTAN SUCCINATE 50 MG PO TABS
100.0000 mg | ORAL_TABLET | Freq: Once | ORAL | 1 refills | Status: DC | PRN
Start: 2023-07-12 — End: 2023-07-15

## 2023-07-12 MED ORDER — LACTATED RINGERS IV BOLUS
1000.0000 mL | Freq: Once | INTRAVENOUS | Status: AC
  Administered 2023-07-12: 1000 mL via INTRAVENOUS

## 2023-07-12 MED ORDER — SODIUM CHLORIDE 0.9 % IV SOLN
25.0000 mg | Freq: Once | INTRAVENOUS | Status: AC
  Administered 2023-07-12: 25 mg via INTRAVENOUS
  Filled 2023-07-12: qty 1

## 2023-07-12 MED ORDER — SUMATRIPTAN SUCCINATE 6 MG/0.5ML ~~LOC~~ SOLN
6.0000 mg | Freq: Once | SUBCUTANEOUS | Status: AC
  Administered 2023-07-12: 6 mg via SUBCUTANEOUS
  Filled 2023-07-12: qty 0.5

## 2023-07-12 MED ORDER — ONDANSETRON HCL 8 MG PO TABS
8.0000 mg | ORAL_TABLET | Freq: Three times a day (TID) | ORAL | 1 refills | Status: DC | PRN
Start: 2023-07-12 — End: 2023-11-06

## 2023-07-12 NOTE — MAU Provider Note (Cosign Needed Addendum)
History     CSN: [id number]  Arrival date and time: 07/12/23 0243   Event Date/Time   First Provider Initiated Contact with Patient 07/12/23 [phone]      No chief complaint on file.  Misty Jimenez is a 27 y.o. G1P0 at [redacted]w[redacted]d who receives care at [facility].  She presents today, via EMS, for headache, back and pelvic pain, nausea/vomiting, and diarrhea.  Patient reports that her headache is "severe" and is sharp shooting pain located in the back of her head radiating to the front into her eyes.  She states she has not taken anything for the headache and rates it a 8/10.  Patient was seen for similar symptoms yesterday, but reports it has returned this morning.  Patient states she did not take anything for the headache.  Patient also reports diarrhea and has had 3-4 watery stools today.  She denies new food intakes or contact with sick individuals.  She reports nausea and vomiting and states she is currently nauseous and last vomited 1.5 hours ago.  She states last night she ate chicken sandwich and had a boost for breakfast, but no other foods.  Patient endorses vaginal discharge, but denies odor, irritation, or abnormal color.  No vaginal bleeding.  Patient reports good fetal movement and denies contractions.  However patient reports lower abdominal cramping and pelvic pressure similar to that experienced yesterday.  OB History     Gravida  1   Para  0   Term  0   Preterm  0   AB  0   Living  0      SAB  0   IAB  0   Ectopic  0   Multiple  0   Live Births  0           Past Medical History:  Diagnosis Date   Abdominal pain, chronic, right lower quadrant 08/03/2013   Abdominal pain, recurrent    With Headache   Acute pyelonephritis 05/07/2016   kidney surgery at 21 months of age   ADHD (attention deficit hyperactivity disorder)    Allergy    Anxiety    Bulimia 04/17/2023   Depression    Endometriosis    Family history of adverse reaction to anesthesia     "grandmother gets PONV"   GE reflux 12/16/2011   Hyperlipidemia    Kidney disease 05/07/2016   LGSIL on Pap smear of cervix 04/13/2023   03/2023 LSIL - PAP in 1 year     Obesity 05/07/2016   PCOS (polycystic ovarian syndrome)    Preventative health care 11/04/2016   Reflux, vesicoureteral    Respiratory illness with fever 12/27/2019   Wrist pain, left 11/04/2016    Past Surgical History:  Procedure Laterality Date   KIDNEY SURGERY     As a small child   TYMPANOSTOMY TUBE PLACEMENT     URETER SURGERY     WRIST SURGERY Left 05/2016   10 pins and 2 plates    Family History  Problem Relation Age of Onset   Kidney disease Mother    Alcohol abuse Father        and drug use, heroin, cocaine, marijuana   Migraines Maternal Aunt    Cancer Paternal Aunt        colon in 35s deceased   Asthma Maternal Grandmother    Diabetes Maternal Grandfather    Hyperlipidemia Other    Hypertension Other    Depression Other    Stroke Neg  Hx     Social History   Tobacco Use   Smoking status: Some Days    Current packs/day: 0.00    Average packs/day: 0.3 packs/day for 2.0 years (0.5 ttl pk-yrs)    Types: Cigarettes    Start date: 10/27/2013    Last attempt to quit: 10/28/2015    Years since quitting: 7.7   Smokeless tobacco: Former    Types: Chew   Tobacco comments:    Pt declines information  Vaping Use   Vaping status: Some Days   Substances: THC  Substance Use Topics   Alcohol use: Not Currently    Alcohol/week: 2.0 - 5.0 standard drinks of alcohol    Types: 2 - 5 Cans of beer per week    Comment: 3-4 x week   Drug use: Not Currently    Types: Marijuana    Allergies:  Allergies  Allergen Reactions   Sulfa Antibiotics Hives and Rash   Sulfasalazine Hives   Amphetamines     Reports "different parts of her body melt and causes hallucinations."   Other     Fiberglass cast caused rash and hives   Monosodium Glutamate Nausea And Vomiting, Rash and Other (See Comments)     Headache and dizziness   Sulfa Drugs Cross Reactors Rash    Medications Prior to Admission  Medication Sig Dispense Refill Last Dose   aspirin EC 81 MG tablet Take 1 tablet (81 mg total) by mouth daily. Take after 12 weeks for prevention of preeclampsia later in pregnancy 300 tablet 2 07/11/2023   FLUoxetine (PROZAC) 20 MG capsule Take 1 capsule (20 mg total) by mouth daily. 30 capsule 3 07/11/2023   cyclobenzaprine (FLEXERIL) 5 MG tablet Take 1 tablet (5 mg total) by mouth 3 (three) times daily as needed for muscle spasms. 30 tablet 0    cyclobenzaprine (FLEXERIL) 5 MG tablet Take 1 tablet (5 mg total) by mouth 3 (three) times daily as needed for muscle spasms. 20 tablet 0    prenatal vitamin w/FE, FA (NATACHEW) 29-1 MG CHEW chewable tablet Chew 1 tablet by mouth daily at 12 noon. (Patient not taking: Reported on 06/10/2023)       Review of Systems  Respiratory:  Positive for cough.   Gastrointestinal:  Positive for abdominal pain (Cramping), diarrhea, nausea and vomiting.  Genitourinary:  Positive for dysuria (Burning), frequency and vaginal discharge. Negative for difficulty urinating and vaginal bleeding.  Neurological:  Positive for headaches.   Physical Exam   Blood pressure 124/81, pulse 81, temperature 98.6 F (37 C), temperature source Oral, resp. rate 20, last menstrual period 12/03/2022, SpO2 97%.  Physical Exam Vitals reviewed. Exam conducted with a chaperone present Charisse Klinefelter, RN).  Constitutional:      Appearance: Normal appearance.  HENT:     Head: Normocephalic and atraumatic.  Eyes:     Conjunctiva/sclera: Conjunctivae normal.  Cardiovascular:     Rate and Rhythm: Normal rate.  Pulmonary:     Effort: Pulmonary effort is normal. No respiratory distress.  Abdominal:     Tenderness: There is abdominal tenderness.  Genitourinary:    General: Normal vulva.     Comments: ROM + collected blindly Malodor noted Musculoskeletal:        General: Normal range of motion.      Cervical back: Normal range of motion.  Skin:    General: Skin is warm and dry.  Neurological:     Mental Status: She is alert and oriented to person, place, and time.  Psychiatric:        Mood and Affect: Mood normal.        Behavior: Behavior normal.    Fetal Assessment 148 bpm, Mod Var, -Decels, +Accels Toco: No ctx graphed  MAU Course   Results for orders placed or performed during the hospital encounter of 07/12/23 (from the past 24 hour(s))  Rupture of Membrane (ROM) Plus     Status: None   Collection Time: 07/12/23  3:29 AM  Result Value Ref Range   Rom Plus NEGATIVE   Urinalysis, Routine w reflex microscopic -Urine, Clean Catch     Status: Abnormal   Collection Time: 07/12/23  3:31 AM  Result Value Ref Range   Color, Urine YELLOW YELLOW   APPearance HAZY (A) CLEAR   Specific Gravity, Urine 1.020 1.005 - 1.030   pH 6.5 5.0 - 8.0   Glucose, UA NEGATIVE NEGATIVE mg/dL   Hgb urine dipstick NEGATIVE NEGATIVE   Bilirubin Urine NEGATIVE NEGATIVE   Ketones, ur NEGATIVE NEGATIVE mg/dL   Protein, ur NEGATIVE NEGATIVE mg/dL   Nitrite NEGATIVE NEGATIVE   Leukocytes,Ua NEGATIVE NEGATIVE  Respiratory (~20 pathogens) panel by PCR     Status: None   Collection Time: 07/12/23  7:21 AM   Specimen: Nasopharyngeal Swab; Respiratory  Result Value Ref Range   Adenovirus NOT DETECTED NOT DETECTED   Coronavirus 229E NOT DETECTED NOT DETECTED   Coronavirus HKU1 NOT DETECTED NOT DETECTED   Coronavirus NL63 NOT DETECTED NOT DETECTED   Coronavirus OC43 NOT DETECTED NOT DETECTED   Metapneumovirus NOT DETECTED NOT DETECTED   Rhinovirus / Enterovirus NOT DETECTED NOT DETECTED   Influenza A NOT DETECTED NOT DETECTED   Influenza B NOT DETECTED NOT DETECTED   Parainfluenza Virus 1 NOT DETECTED NOT DETECTED   Parainfluenza Virus 2 NOT DETECTED NOT DETECTED   Parainfluenza Virus 3 NOT DETECTED NOT DETECTED   Parainfluenza Virus 4 NOT DETECTED NOT DETECTED   Respiratory Syncytial Virus NOT  DETECTED NOT DETECTED   Bordetella pertussis NOT DETECTED NOT DETECTED   Bordetella Parapertussis NOT DETECTED NOT DETECTED   Chlamydophila pneumoniae NOT DETECTED NOT DETECTED   Mycoplasma pneumoniae NOT DETECTED NOT DETECTED  SARS Coronavirus 2 by RT PCR (hospital order, performed in [facility] hospital lab) *cepheid single result test* Anterior Nasal Swab     Status: None   Collection Time: 07/12/23  7:21 AM   Specimen: Anterior Nasal Swab  Result Value Ref Range   SARS Coronavirus 2 by RT PCR NEGATIVE NEGATIVE   No results found.  MDM PE Labs: ROM Plus, UA EFM LR Bolus Pain medication Assessment and Plan  26 year old G1P0  SIUP at 31.4 weeks Cat I FT Nausea/Vomiting Diarrhea Headache R/O ROM Dysuria  -POC reviewed. -Patient declines Imitrex. -Discussed starting IV and giving Phenergan.  Patient agreeable. -Patient reports leaking of fluid for 2 days. -Discussed obtaining ROM plus to rule out. -Exam performed and findings discussed. -UA pending -NST reassuring for GA. -Monitor and reassess.  Cherre Robins MSN, CNM 07/12/2023, 3:36 AM   Reassessment (4:50 AM) -Patient reports HA and abdominal cramping pains. -Now rating 7/10. -Discussed pain medication.  Patient agreeable. -Will give morphine 4 mg IV.  Reassessment (5:47 AM) -Patient reports HA remains despite morphine dosing. -Patient offered additional medications and declines. -However patient asleep in bed. -Reports continued nausea, but no vomiting. -Patient also requested additional IV fluids.  Ordered. -Monitor reassess.  Reassessment (6:50 AM) -Provider to bedside to reassess. -Patient states HA is worsening  and is now a 9/10. -Continues to report nausea. 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 -Discussed usage and subcu injection and patient agreeable. -Discussed additional  nausea. -Will give imitrex [place] and Zofran for nausea.  -Patient also reporting that husband has been experiencing similar symptoms. -Discussed and patient agreeable to Respiratory testing.  Reassessment (8:08 AM) -Report given to V. Katrinka Blazing, CNM for assumption of care.  Cherre Robins MSN, CNM Advanced Practice Provider, [facility]  0815: Dorathy Kinsman, CNM assumed care of patient.  Respiratory panel pending.  - 31.4 weeks with numerous complaints. Resp panel, COVID neg, UA, ROM plus negative. No clear etiology for various Sx. HA does respond to Tx and then returns. No HA red flags. Pt reports partner has Sx. Suspect GI virus. Recommend seeing PCP for non-OB concerns as needed. No evidence of preterm labor or PROM.    Assessment 1. Headache in pregnancy, antepartum, third trimester   2. [redacted] weeks gestation of pregnancy   3. NST (non-stress test) reactive   4. Nausea and vomiting during pregnancy   5. Diarrhea during pregnancy    Plan D/C home in stable condition Rx Imitrex and Zofran  Follow-up Information     [facility] Follow up.   Specialty: Obstetrics and Gynecology Why: Routine prenatal visit Contact information: [address] [place] [address] [phone]        [facility] Follow up.   Specialty: Obstetrics and Gynecology Why: Pregnancy Emergencies Contact information: [address] [place] [address] [phone]        Levie Heritage, DO .   Specialty: Family Medicine Contact information: [address] [place] [address] [phone]         [facility] Care Provider Follow up.   Why: For non-pregnancy concerns               Allergies as of 07/12/2023       Reactions   Sulfa Antibiotics Hives, Rash   Sulfasalazine Hives   Amphetamines    Reports "different parts of her body  melt and causes hallucinations."   Other    Fiberglass cast caused rash and hives   Monosodium Glutamate Nausea And Vomiting, Rash, Other (See Comments)   Headache and dizziness   Sulfa Drugs Cross Reactors Rash        Medication List     TAKE these medications    aspirin EC 81 MG tablet Take 1 tablet (81 mg total) by mouth daily. Take after 12 weeks for prevention of preeclampsia later in pregnancy   cyclobenzaprine 5 MG tablet Commonly known as: FLEXERIL Take 1 tablet (5 mg total) by mouth 3 (three) times daily as needed for muscle spasms.   cyclobenzaprine 5 MG tablet Commonly known as: FLEXERIL Take 1 tablet (5 mg total) by mouth 3 (three) times daily as needed for muscle spasms.   FLUoxetine 20 MG capsule Commonly known as: PROzac Take 1 capsule (20 mg total) by mouth daily.   prenatal vitamin w/FE, FA 29-1 MG Chew chewable tablet Chew 1 tablet by mouth daily at 12 noon.          [place], CNM 07/12/2023 12:11 PM

## 2023-07-12 NOTE — MAU Note (Signed)
.  Misty Jimenez is a 27 y.o. at [redacted]w[redacted]d here in MAU reporting: headache, nausea/vomiting, back/pelvic pain, diarrhea LMP: 12/03/22 Onset of complaint: 07/11/23 Pain score: 8/10 Vitals:   07/12/23 0436 07/12/23 0441  BP:    Pulse:    Resp:    Temp:    SpO2: 99% 99%     FHT:148 Lab orders placed from triage:

## 2023-07-13 LAB — GC/CHLAMYDIA PROBE AMP (~~LOC~~) NOT AT ARMC
Chlamydia: NEGATIVE
Comment: NEGATIVE
Comment: NORMAL
Neisseria Gonorrhea: NEGATIVE

## 2023-07-14 ENCOUNTER — Telehealth: Payer: Self-pay

## 2023-07-14 NOTE — Telephone Encounter (Signed)
Patient called and stating that she was upset with the care she received in MAU. Patient states that she was not told what she was given in her IV and thinks it was morphine.  Patient states that she is still feeling sick and doesn't want to go back to MAU because they just advised her to suck it up. Patient made aware that the last time she was there it looks like RX for imitrex and zofran where sent to her pharmacy and patient states"those dont help".  Advised patient she could send my chart message and we could forward concerns to Dr. Adrian Blackwater and she said she will just wait until her appointment tomorrow with him. Armandina Stammer RN

## 2023-07-15 ENCOUNTER — Ambulatory Visit (INDEPENDENT_AMBULATORY_CARE_PROVIDER_SITE_OTHER): Payer: 59 | Admitting: Family Medicine

## 2023-07-15 ENCOUNTER — Telehealth: Payer: Self-pay | Admitting: Clinical

## 2023-07-15 ENCOUNTER — Other Ambulatory Visit (HOSPITAL_COMMUNITY)
Admission: RE | Admit: 2023-07-15 | Discharge: 2023-07-15 | Disposition: A | Discharge status: Home / Self Care | Payer: 59 | Source: Ambulatory Visit | Attending: Family Medicine | Admitting: Family Medicine

## 2023-07-15 VITALS — BP 109/67 | HR 98 | Wt 205.0 lb

## 2023-07-15 DIAGNOSIS — M9903 Segmental and somatic dysfunction of lumbar region: Secondary | ICD-10-CM

## 2023-07-15 DIAGNOSIS — M9901 Segmental and somatic dysfunction of cervical region: Secondary | ICD-10-CM

## 2023-07-15 DIAGNOSIS — F322 Major depressive disorder, single episode, severe without psychotic features: Secondary | ICD-10-CM

## 2023-07-15 DIAGNOSIS — M9900 Segmental and somatic dysfunction of head region: Secondary | ICD-10-CM | POA: Diagnosis not present

## 2023-07-15 DIAGNOSIS — R87612 Low grade squamous intraepithelial lesion on cytologic smear of cervix (LGSIL): Secondary | ICD-10-CM

## 2023-07-15 DIAGNOSIS — M9904 Segmental and somatic dysfunction of sacral region: Secondary | ICD-10-CM | POA: Diagnosis not present

## 2023-07-15 DIAGNOSIS — F603 Borderline personality disorder: Secondary | ICD-10-CM

## 2023-07-15 DIAGNOSIS — O0990 Supervision of high risk pregnancy, unspecified, unspecified trimester: Secondary | ICD-10-CM | POA: Insufficient documentation

## 2023-07-15 DIAGNOSIS — O0993 Supervision of high risk pregnancy, unspecified, third trimester: Secondary | ICD-10-CM

## 2023-07-15 DIAGNOSIS — M9905 Segmental and somatic dysfunction of pelvic region: Secondary | ICD-10-CM

## 2023-07-15 DIAGNOSIS — G43809 Other migraine, not intractable, without status migrainosus: Secondary | ICD-10-CM

## 2023-07-15 DIAGNOSIS — O99210 Obesity complicating pregnancy, unspecified trimester: Secondary | ICD-10-CM

## 2023-07-15 DIAGNOSIS — Z3A32 32 weeks gestation of pregnancy: Secondary | ICD-10-CM

## 2023-07-15 MED ORDER — FAMOTIDINE 20 MG PO TABS: 20.0000 mg | ORAL_TABLET | Freq: Two times a day (BID) | ORAL | 3 refills | Status: DC

## 2023-07-15 MED ORDER — SUMATRIPTAN SUCCINATE 6 MG/0.5ML ~~LOC~~ SOAJ: 6.0000 mg | Freq: Every day | SUBCUTANEOUS | 6 refills | Status: DC | PRN

## 2023-07-15 NOTE — Telephone Encounter (Signed)
Attempt call regarding referral; Unable to leave message as mailbox is full.

## 2023-07-15 NOTE — Progress Notes (Signed)
PRENATAL VISIT NOTE  Subjective:  Misty Jimenez is a 27 y.o. G1P0000 at [redacted]w[redacted]d being seen today for ongoing prenatal care.  She is currently monitored for the following issues for this high-risk pregnancy and has Obesity in pregnancy, antepartum; Severe bipolar I disorder, most recent episode depressed (HCC); Severe recurrent major depression without psychotic features [facility]); MDD (major depressive disorder), severe (HCC); Posttraumatic stress disorder; Borderline personality disorder (HCC); Bipolar 2 disorder (HCC); Tobacco use; LGSIL on Pap smear of cervix; Supervision of high risk pregnancy, antepartum; and Late prenatal care affecting pregnancy, antepartum on their problem list.  Patient reports headache. Was seen in MAU 3 times for migraines, back pain, pelvic pain, nausea and vomiting. She got a dose of imitrex IV and IM, both of which were effective. However the oral form was not. Additionally, she was given a dose of morphine - the medicine was administered through the IV before she was told what it was. When the nurse was asked, it was then disclosed that the medication was morphine.   She has a fair amount of back pain in the lower back. Not improved with flexeril.   Contractions: Irritability. Vag. Bleeding: None.  Movement: Present. Denies leaking of fluid.   The following portions of the patient's history were reviewed and updated as appropriate: allergies, current medications, past family history, past medical history, past social history, past surgical history and problem list.   Objective:   Vitals:   07/15/23 0853  BP: 109/67  Pulse: 98  Weight: 205 lb (93 kg)    Fetal Status: Fetal Heart Rate (bpm): 141   Movement: Present     General:  Alert, oriented and cooperative. Patient is in no acute distress.  Skin: Skin is warm and dry. No rash noted.   Cardiovascular: Normal heart rate noted  Respiratory: Normal respiratory effort, no problems with respiration noted   Abdomen: Soft, gravid, appropriate for gestational age.  Pain/Pressure: Present     Pelvic: Cervical exam deferred        Extremities: Normal range of motion.  Edema: None  Mental Status: Normal mood and affect. Normal behavior. Normal judgment and thought content.   MSK: Restriction, tenderness, tissue texture changes, and paraspinal spasm in the left cervical spine, left lumbar  Neuro: Moves all four extremities with no focal neurological deficit   OSE: Head OA SRRL  Cervical C2 rotated left  Thoracic T4 FSRL  Rib inhaled  Lumbar L1 ESRR, L5 ESRL  Sacrum L/L  Pelvis Right ant     Assessment and Plan:  Pregnancy: G1P0000 at [redacted]w[redacted]d 1. [redacted] weeks gestation of pregnancy  2. Supervision of high risk pregnancy, antepartum FHT normal I apologized to the patient and acknowledged that she should never have medications given to her without being first told what it is. Will address with staff involved. - Cervicovaginal ancillary only( [place])  3. LGSIL on Pap smear of cervix Needs colpo. Patient wanted this deferred to after pregnancy.  4. Borderline personality disorder (HCC) On prozac In recovery for addition.  5. MDD (major depressive disorder), severe (HCC)  6. Obesity in pregnancy, antepartum  7. Other migraine without status migrainosus, not intractable Will try [place] autoinjector as the oral tablets do not seem to work as well and the nasal spray is too triggering for her.  8. Somatic dysfunction of spine, cervical 9. Somatic dysfunction of head region 10. Somatic dysfunction of sacral region 11. Somatic dysfunction of lumbar region 12. Somatic dysfunction of pelvis region  OMT done after patient permission. HVLA technique utilized. 7 areas treated with improvement of tissue texture and joint mobility. Patient tolerated procedure well. Hopefully the OMT will help with patient's migraines and back pain.     Preterm labor symptoms and general obstetric precautions  including but not limited to vaginal bleeding, contractions, leaking of fluid and fetal movement were reviewed in detail with the patient. Please refer to After Visit Summary for other counseling recommendations.   No follow-ups on file.  Future Appointments  Date Time Provider Department Center  07/21/2023  3:30 PM [facility]  07/30/2023  9:55 AM Levie Heritage, DO [facility] None  08/12/2023  9:55 AM Levie Heritage, DO [facility] None  08/26/2023  9:55 AM Levie Heritage, DO [facility] None    Levie Heritage, DO

## 2023-07-16 LAB — CERVICOVAGINAL ANCILLARY ONLY
Bacterial Vaginitis (gardnerella): NEGATIVE
Candida Glabrata: NEGATIVE
Candida Vaginitis: NEGATIVE
Comment: NEGATIVE
Comment: NEGATIVE
Comment: NEGATIVE

## 2023-07-21 ENCOUNTER — Other Ambulatory Visit: Payer: Self-pay | Admitting: Obstetrics

## 2023-07-21 ENCOUNTER — Ambulatory Visit: Payer: 59 | Attending: Obstetrics

## 2023-07-21 DIAGNOSIS — O0933 Supervision of pregnancy with insufficient antenatal care, third trimester: Secondary | ICD-10-CM

## 2023-07-21 DIAGNOSIS — O365930 Maternal care for other known or suspected poor fetal growth, third trimester, not applicable or unspecified: Secondary | ICD-10-CM

## 2023-07-21 DIAGNOSIS — O99213 Obesity complicating pregnancy, third trimester: Secondary | ICD-10-CM | POA: Insufficient documentation

## 2023-07-21 DIAGNOSIS — E669 Obesity, unspecified: Secondary | ICD-10-CM | POA: Diagnosis not present

## 2023-07-21 DIAGNOSIS — O0932 Supervision of pregnancy with insufficient antenatal care, second trimester: Secondary | ICD-10-CM | POA: Insufficient documentation

## 2023-07-21 DIAGNOSIS — Z3A32 32 weeks gestation of pregnancy: Secondary | ICD-10-CM

## 2023-07-22 ENCOUNTER — Other Ambulatory Visit: Payer: Self-pay | Admitting: *Deleted

## 2023-07-22 DIAGNOSIS — O365990 Maternal care for other known or suspected poor fetal growth, unspecified trimester, not applicable or unspecified: Secondary | ICD-10-CM | POA: Insufficient documentation

## 2023-07-28 ENCOUNTER — Ambulatory Visit: Payer: 59 | Admitting: *Deleted

## 2023-07-28 ENCOUNTER — Other Ambulatory Visit: Payer: Self-pay | Admitting: Maternal & Fetal Medicine

## 2023-07-28 ENCOUNTER — Ambulatory Visit: Payer: 59 | Attending: Maternal & Fetal Medicine

## 2023-07-28 DIAGNOSIS — O365930 Maternal care for other known or suspected poor fetal growth, third trimester, not applicable or unspecified: Secondary | ICD-10-CM

## 2023-07-28 DIAGNOSIS — Z3A33 33 weeks gestation of pregnancy: Secondary | ICD-10-CM

## 2023-07-28 DIAGNOSIS — O365990 Maternal care for other known or suspected poor fetal growth, unspecified trimester, not applicable or unspecified: Secondary | ICD-10-CM | POA: Insufficient documentation

## 2023-07-28 DIAGNOSIS — O0933 Supervision of pregnancy with insufficient antenatal care, third trimester: Secondary | ICD-10-CM

## 2023-07-28 DIAGNOSIS — E669 Obesity, unspecified: Secondary | ICD-10-CM

## 2023-07-28 DIAGNOSIS — O99213 Obesity complicating pregnancy, third trimester: Secondary | ICD-10-CM | POA: Diagnosis not present

## 2023-07-28 NOTE — Procedures (Signed)
Misty Jimenez 1996/09/15 [redacted]w[redacted]d  Fetus A Non-Stress Test Interpretation for 07/28/23  Indication: IUGR  Fetal Heart Rate A Mode: External Baseline Rate (A): 130 bpm Variability: Moderate Accelerations: 15 x 15 Decelerations: None Multiple birth?: No  Uterine Activity Mode: Palpation, Toco Contraction Frequency (min): None Resting Tone Palpated: Relaxed Resting Time: Adequate  Interpretation (Fetal Testing) Nonstress Test Interpretation: Reactive Comments: Dr. Grace Bushy reviewed tracing.

## 2023-07-30 ENCOUNTER — Encounter: Payer: 59 | Admitting: Family Medicine

## 2023-08-04 ENCOUNTER — Other Ambulatory Visit: Payer: Self-pay | Admitting: Maternal & Fetal Medicine

## 2023-08-04 ENCOUNTER — Ambulatory Visit: Payer: 59 | Attending: Maternal & Fetal Medicine

## 2023-08-04 DIAGNOSIS — O0933 Supervision of pregnancy with insufficient antenatal care, third trimester: Secondary | ICD-10-CM | POA: Diagnosis not present

## 2023-08-04 DIAGNOSIS — O365990 Maternal care for other known or suspected poor fetal growth, unspecified trimester, not applicable or unspecified: Secondary | ICD-10-CM | POA: Insufficient documentation

## 2023-08-04 DIAGNOSIS — E669 Obesity, unspecified: Secondary | ICD-10-CM

## 2023-08-04 DIAGNOSIS — Z3A34 34 weeks gestation of pregnancy: Secondary | ICD-10-CM

## 2023-08-04 DIAGNOSIS — O99213 Obesity complicating pregnancy, third trimester: Secondary | ICD-10-CM

## 2023-08-04 DIAGNOSIS — O365930 Maternal care for other known or suspected poor fetal growth, third trimester, not applicable or unspecified: Secondary | ICD-10-CM | POA: Diagnosis not present

## 2023-08-10 ENCOUNTER — Telehealth: Payer: Self-pay

## 2023-08-10 NOTE — Telephone Encounter (Signed)
Patient called the after hours nurse line stating she is having trouble pooping and is bleeding from her rectum. Patient stated she took miralax and metamucil but neither provided any relief. She states she is bleeding profusely. Patient was advised to go to MAU by the after hours nurse.   Called patient twice to f/u. Left message for patient to call the office back. Kenya Shiraishi l Betzalel Umbarger, CMA

## 2023-08-11 ENCOUNTER — Ambulatory Visit: Payer: 59

## 2023-08-11 ENCOUNTER — Other Ambulatory Visit: Payer: Self-pay

## 2023-08-11 ENCOUNTER — Ambulatory Visit: Payer: 59 | Admitting: *Deleted

## 2023-08-11 ENCOUNTER — Ambulatory Visit: Payer: 59 | Attending: Maternal & Fetal Medicine

## 2023-08-11 ENCOUNTER — Other Ambulatory Visit: Payer: Self-pay | Admitting: *Deleted

## 2023-08-11 ENCOUNTER — Other Ambulatory Visit: Payer: Self-pay | Admitting: Maternal & Fetal Medicine

## 2023-08-11 DIAGNOSIS — O365990 Maternal care for other known or suspected poor fetal growth, unspecified trimester, not applicable or unspecified: Secondary | ICD-10-CM

## 2023-08-11 DIAGNOSIS — Z362 Encounter for other antenatal screening follow-up: Secondary | ICD-10-CM

## 2023-08-11 DIAGNOSIS — E669 Obesity, unspecified: Secondary | ICD-10-CM

## 2023-08-11 DIAGNOSIS — O0933 Supervision of pregnancy with insufficient antenatal care, third trimester: Secondary | ICD-10-CM | POA: Diagnosis present

## 2023-08-11 DIAGNOSIS — F1911 Other psychoactive substance abuse, in remission: Secondary | ICD-10-CM | POA: Insufficient documentation

## 2023-08-11 DIAGNOSIS — O99213 Obesity complicating pregnancy, third trimester: Secondary | ICD-10-CM

## 2023-08-11 DIAGNOSIS — O365930 Maternal care for other known or suspected poor fetal growth, third trimester, not applicable or unspecified: Secondary | ICD-10-CM

## 2023-08-11 DIAGNOSIS — Z3A35 35 weeks gestation of pregnancy: Secondary | ICD-10-CM

## 2023-08-11 NOTE — Procedures (Signed)
Lacey L Schreur Feb 14, 1996 [redacted]w[redacted]d  Fetus A Non-Stress Test Interpretation for 10/15/24NST with BPP  Indication: Unsatisfactory BPP, Late PNC  Fetal Heart Rate A Mode: External Baseline Rate (A): 150 bpm Variability: Moderate Accelerations: 15 x 15 Decelerations: None Multiple birth?: No  Uterine Activity Mode: Toco Contraction Frequency (min): none Resting Tone Palpated: Relaxed  Interpretation (Fetal Testing) Nonstress Test Interpretation: Reactive Overall Impression: Reassuring for gestational age Comments: Tracing reviewed byDr. Grace Bushy

## 2023-08-12 ENCOUNTER — Encounter: Payer: 59 | Admitting: Family Medicine

## 2023-08-18 ENCOUNTER — Other Ambulatory Visit: Payer: Self-pay

## 2023-08-18 ENCOUNTER — Ambulatory Visit: Payer: 59 | Attending: Maternal & Fetal Medicine

## 2023-08-18 DIAGNOSIS — O365930 Maternal care for other known or suspected poor fetal growth, third trimester, not applicable or unspecified: Secondary | ICD-10-CM | POA: Diagnosis not present

## 2023-08-18 DIAGNOSIS — E669 Obesity, unspecified: Secondary | ICD-10-CM | POA: Diagnosis not present

## 2023-08-18 DIAGNOSIS — O99213 Obesity complicating pregnancy, third trimester: Secondary | ICD-10-CM | POA: Diagnosis not present

## 2023-08-18 DIAGNOSIS — O0933 Supervision of pregnancy with insufficient antenatal care, third trimester: Secondary | ICD-10-CM | POA: Diagnosis not present

## 2023-08-18 DIAGNOSIS — Z3A36 36 weeks gestation of pregnancy: Secondary | ICD-10-CM

## 2023-08-18 DIAGNOSIS — Z362 Encounter for other antenatal screening follow-up: Secondary | ICD-10-CM | POA: Insufficient documentation

## 2023-08-20 ENCOUNTER — Encounter (HOSPITAL_COMMUNITY): Payer: Self-pay | Admitting: Obstetrics & Gynecology

## 2023-08-20 ENCOUNTER — Inpatient Hospital Stay (HOSPITAL_COMMUNITY)
Admission: AD | Admit: 2023-08-20 | Discharge: 2023-08-20 | Disposition: A | Discharge status: Home / Self Care | Payer: 59 | Attending: Obstetrics & Gynecology | Admitting: Obstetrics & Gynecology

## 2023-08-20 ENCOUNTER — Other Ambulatory Visit: Payer: Self-pay

## 2023-08-20 DIAGNOSIS — R519 Headache, unspecified: Secondary | ICD-10-CM | POA: Diagnosis not present

## 2023-08-20 DIAGNOSIS — Z3A37 37 weeks gestation of pregnancy: Secondary | ICD-10-CM | POA: Insufficient documentation

## 2023-08-20 DIAGNOSIS — O471 False labor at or after 37 completed weeks of gestation: Secondary | ICD-10-CM | POA: Diagnosis present

## 2023-08-20 DIAGNOSIS — O99613 Diseases of the digestive system complicating pregnancy, third trimester: Secondary | ICD-10-CM | POA: Diagnosis not present

## 2023-08-20 DIAGNOSIS — O26893 Other specified pregnancy related conditions, third trimester: Secondary | ICD-10-CM | POA: Diagnosis not present

## 2023-08-20 DIAGNOSIS — K219 Gastro-esophageal reflux disease without esophagitis: Secondary | ICD-10-CM | POA: Diagnosis present

## 2023-08-20 DIAGNOSIS — K2100 Gastro-esophageal reflux disease with esophagitis, without bleeding: Secondary | ICD-10-CM | POA: Diagnosis not present

## 2023-08-20 DIAGNOSIS — R102 Pelvic and perineal pain: Secondary | ICD-10-CM | POA: Insufficient documentation

## 2023-08-20 DIAGNOSIS — R112 Nausea with vomiting, unspecified: Secondary | ICD-10-CM | POA: Diagnosis present

## 2023-08-20 LAB — URINALYSIS, ROUTINE W REFLEX MICROSCOPIC
Bilirubin Urine: NEGATIVE
Glucose, UA: NEGATIVE mg/dL
Hgb urine dipstick: NEGATIVE
Ketones, ur: NEGATIVE mg/dL
Leukocytes,Ua: NEGATIVE
Nitrite: NEGATIVE
Protein, ur: NEGATIVE mg/dL
Specific Gravity, Urine: 1.024 (ref 1.005–1.030)
pH: 6 (ref 5.0–8.0)

## 2023-08-20 MED ORDER — PANTOPRAZOLE SODIUM 20 MG PO TBEC: 20.0000 mg | DELAYED_RELEASE_TABLET | Freq: Every day | ORAL | 0 refills | Status: DC

## 2023-08-20 MED ORDER — FAMOTIDINE 20 MG PO TABS
20.0000 mg | ORAL_TABLET | Freq: Once | ORAL | Status: AC
  Administered 2023-08-20: 20 mg via ORAL
  Filled 2023-08-20: qty 1

## 2023-08-20 MED ORDER — ACETAMINOPHEN-CAFFEINE 500-65 MG PO TABS
2.0000 | ORAL_TABLET | Freq: Once | ORAL | Status: AC
  Administered 2023-08-20: 2 via ORAL
  Filled 2023-08-20: qty 2

## 2023-08-20 NOTE — MAU Provider Note (Signed)
Chief Complaint:  Contractions and Emesis During Pregnancy   Event Date/Time   First Provider Initiated Contact with Patient 08/20/23 2125     HPI: Monessen L Pigue is a 27 y.o. G1P0000 at 32w1dwho presents to maternity admissions reporting pelvic discomfort, headache and acid reflux burning.  Not sure what contractions are supposed to feel like. She reports good fetal movement, denies LOF, vaginal bleeding, urinary symptoms,  n/v, diarrhea, constipation or fever/chills.    Pelvic Pain The patient's primary symptoms include pelvic pain. The patient's pertinent negatives include no genital itching, genital odor or vaginal bleeding. The problem has been waxing and waning. She is pregnant. Associated symptoms include back pain. Pertinent negatives include no chills, dysuria, fever, headaches or nausea. Nothing aggravates the symptoms. She has tried nothing for the symptoms.   RN Note: LIVELY BEHAR is a 27 y.o. at [redacted]w[redacted]d here in MAU reporting ctxs and n/v since last night. Thinks the ctx pain is making her throw up. Burning in chest from n/v and thinks is GERD. Reports good FM and denies LOF or VB.   Onset of complaint: last night Pain score: 8  Past Medical History: Past Medical History:  Diagnosis Date   Abdominal pain, chronic, right lower quadrant 08/03/2013   Abdominal pain, recurrent    With Headache   Acute pyelonephritis 05/07/2016   kidney surgery at 11 months of age   ADHD (attention deficit hyperactivity disorder)    Allergy    Anxiety    Bulimia 04/17/2023   Depression    Endometriosis    Family history of adverse reaction to anesthesia    "grandmother gets PONV"   GE reflux 12/16/2011   Hyperlipidemia    Kidney disease 05/07/2016   LGSIL on Pap smear of cervix 04/13/2023   03/2023 LSIL - PAP in 1 year     Obesity 05/07/2016   PCOS (polycystic ovarian syndrome)    Preventative health care 11/04/2016   Reflux, vesicoureteral    Respiratory illness with fever  12/27/2019   Wrist pain, left 11/04/2016    Past obstetric history: OB History  Gravida Para Term Preterm AB Living  1 0 0 0 0 0  SAB IAB Ectopic Multiple Live Births  0 0 0 0 0    # Outcome Date GA Lbr Len/2nd Weight Sex Type Anes PTL Lv  1 Current             Past Surgical History: Past Surgical History:  Procedure Laterality Date   KIDNEY SURGERY     As a small child   TYMPANOSTOMY TUBE PLACEMENT     URETER SURGERY     WRIST SURGERY Left 05/2016   10 pins and 2 plates    Family History: Family History  Problem Relation Age of Onset   Kidney disease Mother    Alcohol abuse Father        and drug use, heroin, cocaine, marijuana   Migraines Maternal Aunt    Cancer Paternal Aunt        colon in 66s deceased   Asthma Maternal Grandmother    Diabetes Maternal Grandfather    Hyperlipidemia Other    Hypertension Other    Depression Other    Stroke Neg Hx     Social History: Social History   Tobacco Use   Smoking status: Some Days    Current packs/day: 0.00    Average packs/day: 0.3 packs/day for 2.0 years (0.5 ttl pk-yrs)    Types: Cigarettes  Start date: 10/27/2013    Last attempt to quit: 10/28/2015    Years since quitting: 7.8   Smokeless tobacco: Former    Types: Chew   Tobacco comments:    Pt declines information  Vaping Use   Vaping status: Some Days   Substances: THC  Substance Use Topics   Alcohol use: Not Currently    Alcohol/week: 2.0 - 5.0 standard drinks of alcohol    Types: 2 - 5 Cans of beer per week    Comment: 3-4 x week   Drug use: Not Currently    Types: Marijuana    Allergies:  Allergies  Allergen Reactions   Sulfa Antibiotics Hives and Rash   Sulfasalazine Hives   Amphetamines     Reports "different parts of her body melt and causes hallucinations."   Other     Fiberglass cast caused rash and hives   Monosodium Glutamate Nausea And Vomiting, Rash and Other (See Comments)    Headache and dizziness   Sulfa Drugs Cross  Reactors Rash    Meds:  Medications Prior to Admission  Medication Sig Dispense Refill Last Dose   aspirin EC 81 MG tablet Take 1 tablet (81 mg total) by mouth daily. Take after 12 weeks for prevention of preeclampsia later in pregnancy 300 tablet 2    cyclobenzaprine (FLEXERIL) 5 MG tablet Take 1 tablet (5 mg total) by mouth 3 (three) times daily as needed for muscle spasms. (Patient not taking: Reported on 07/15/2023) 30 tablet 0    cyclobenzaprine (FLEXERIL) 5 MG tablet Take 1 tablet (5 mg total) by mouth 3 (three) times daily as needed for muscle spasms. (Patient not taking: Reported on 07/15/2023) 20 tablet 0    famotidine (PEPCID) 20 MG tablet Take 1 tablet (20 mg total) by mouth 2 (two) times daily. 60 tablet 3    FLUoxetine (PROZAC) 20 MG capsule Take 1 capsule (20 mg total) by mouth daily. 30 capsule 3    ondansetron (ZOFRAN) 8 MG tablet Take 1 tablet (8 mg total) by mouth every 8 (eight) hours as needed for nausea or vomiting. 20 tablet 1    prenatal vitamin w/FE, FA (NATACHEW) 29-1 MG CHEW chewable tablet Chew 1 tablet by mouth daily at 12 noon. (Patient not taking: Reported on 06/10/2023)      SUMAtriptan 6 MG/0.5ML SOAJ Inject 6 mg into the skin daily as needed. Inject under the skin at onset of migraine headache. May repeat once in 1 hour if needed. Maximum of 2 injections in 24 hours. 15 mL 6     I have reviewed patient's Past Medical Hx, Surgical Hx, Family Hx, Social Hx, medications and allergies.   ROS:  Review of Systems  Constitutional:  Negative for chills and fever.  Gastrointestinal:  Negative for nausea.  Genitourinary:  Positive for pelvic pain. Negative for dysuria.  Musculoskeletal:  Positive for back pain.  Neurological:  Negative for headaches.   Other systems negative  Physical Exam  Patient Vitals for the past 24 hrs:  BP Temp Pulse Resp SpO2 Height Weight  08/20/23 2041 124/76 -- -- -- -- -- --  08/20/23 2039 -- 98 F (36.7 C) 78 18 98 % 5' 7.25" (1.708  m) 93.9 kg   Constitutional: Well-developed, well-nourished female in no acute distress.  Cardiovascular: normal rate  Respiratory: normal effort GI: Abd soft, non-tender, gravid appropriate for gestational age.   No rebound or guarding. MS: Extremities nontender, no edema, normal ROM Neurologic: Alert and oriented x 4.  GU: Neg CVAT.  PELVIC EXAM:  Dilation: Fingertip Effacement (%): 80 Station: -1 Presentation: Vertex Exam by:: Wynelle Bourgeois, cnm  FHT:  Baseline 135 , moderate variability, accelerations present, no decelerations Contractions Occasional and Irregular     Labs: Results for orders placed or performed during the hospital encounter of 08/20/23 (from the past 24 hour(s))  Urinalysis, Routine w reflex microscopic -Urine, Clean Catch     Status: Abnormal   Collection Time: 08/20/23  9:18 PM  Result Value Ref Range   Color, Urine YELLOW YELLOW   APPearance CLEAR CLEAR   Specific Gravity, Urine 1.024 1.005 - 1.030   pH 6.0 5.0 - 8.0   Glucose, UA NEGATIVE NEGATIVE mg/dL   Hgb urine dipstick NEGATIVE NEGATIVE   Bilirubin Urine NEGATIVE NEGATIVE   Ketones, ur NEGATIVE NEGATIVE mg/dL   Protein, ur NEGATIVE NEGATIVE mg/dL   Nitrite NEGATIVE NEGATIVE   Leukocytes,Ua NEGATIVE NEGATIVE   RBC / HPF 0-5 0 - 5 RBC/hpf   WBC, UA 0-5 0 - 5 WBC/hpf   Bacteria, UA RARE (A) NONE SEEN   Squamous Epithelial / HPF 11-20 0 - 5 /HPF   Mucus PRESENT     O/Positive/-- (06/12 0940)  Imaging:    MAU Course/MDM: I have reviewed the triage vital signs and the nursing notes.   Pertinent labs & imaging results that were available during my care of the patient were reviewed by me and considered in my medical decision making (see chart for details).      I have reviewed her medical records including past results, notes and treatments.   I have ordered labs and reviewed results.  NST reviewed .  Treatments in MAU included Excedrin Tension and Pepcid  She did get some relief from  this. .    Assessment: Single IUP at [redacted]w[redacted]d Pelvic pain related to late pregnancy and fetal pelvic descent Headache GERD  Plan: Discharge home Rx protonix for GERD  Labor precautions and fetal kick counts Follow up in Office for prenatal visits and recheck Encouraged to return if she develops worsening of symptoms, increase in pain, fever, or other concerning symptoms.   Pt stable at time of discharge.  Wynelle Bourgeois CNM, MSN Certified Nurse-Midwife 08/20/2023 9:25 PM

## 2023-08-20 NOTE — MAU Note (Signed)
.  Misty Jimenez is a 27 y.o. at [redacted]w[redacted]d here in MAU reporting ctxs and n/v since last night. Thinks the ctx pain is making her throw up. Burning in chest from n/v and thinks is GERD. Reports good FM and denies LOF or VB.   Onset of complaint: last night Pain score: 8 Vitals:   08/20/23 2039 08/20/23 2041  BP:  124/76  Pulse: 78   Resp: 18   Temp: 98 F (36.7 C)   SpO2: 98%      FHT:149 Lab orders placed from triage:  u/a

## 2023-08-22 ENCOUNTER — Encounter (HOSPITAL_COMMUNITY): Payer: Self-pay | Admitting: Obstetrics & Gynecology

## 2023-08-22 ENCOUNTER — Inpatient Hospital Stay (HOSPITAL_COMMUNITY)
Admission: AD | Admit: 2023-08-22 | Discharge: 2023-08-22 | Disposition: A | Discharge status: Home / Self Care | Payer: 59 | Attending: Obstetrics & Gynecology | Admitting: Obstetrics & Gynecology

## 2023-08-22 DIAGNOSIS — O26893 Other specified pregnancy related conditions, third trimester: Secondary | ICD-10-CM | POA: Diagnosis not present

## 2023-08-22 DIAGNOSIS — N898 Other specified noninflammatory disorders of vagina: Secondary | ICD-10-CM | POA: Diagnosis present

## 2023-08-22 DIAGNOSIS — Z3A37 37 weeks gestation of pregnancy: Secondary | ICD-10-CM | POA: Insufficient documentation

## 2023-08-22 DIAGNOSIS — R519 Headache, unspecified: Secondary | ICD-10-CM | POA: Insufficient documentation

## 2023-08-22 DIAGNOSIS — O99353 Diseases of the nervous system complicating pregnancy, third trimester: Secondary | ICD-10-CM | POA: Insufficient documentation

## 2023-08-22 DIAGNOSIS — Z3493 Encounter for supervision of normal pregnancy, unspecified, third trimester: Secondary | ICD-10-CM

## 2023-08-22 DIAGNOSIS — O219 Vomiting of pregnancy, unspecified: Secondary | ICD-10-CM

## 2023-08-22 DIAGNOSIS — O212 Late vomiting of pregnancy: Secondary | ICD-10-CM | POA: Insufficient documentation

## 2023-08-22 DIAGNOSIS — Z3689 Encounter for other specified antenatal screening: Secondary | ICD-10-CM

## 2023-08-22 LAB — OB RESULTS CONSOLE GBS: GBS: POSITIVE

## 2023-08-22 LAB — WET PREP, GENITAL
Clue Cells Wet Prep HPF POC: NONE SEEN
Sperm: NONE SEEN
Trich, Wet Prep: NONE SEEN
WBC, Wet Prep HPF POC: <10 (ref <10)
Yeast Wet Prep HPF POC: NONE SEEN

## 2023-08-22 LAB — RUPTURE OF MEMBRANE (ROM)PLUS: Rom Plus: NEGATIVE

## 2023-08-22 LAB — POCT FERN TEST: POCT Fern Test: NEGATIVE

## 2023-08-22 MED ORDER — LACTATED RINGERS IV BOLUS
1000.0000 mL | Freq: Once | INTRAVENOUS | Status: AC
  Administered 2023-08-22: 1000 mL via INTRAVENOUS

## 2023-08-22 MED ORDER — DIPHENHYDRAMINE HCL 50 MG/ML IJ SOLN
12.5000 mg | Freq: Once | INTRAMUSCULAR | Status: AC
  Administered 2023-08-22: 12.5 mg via INTRAVENOUS
  Filled 2023-08-22: qty 1

## 2023-08-22 MED ORDER — DEXAMETHASONE SODIUM PHOSPHATE 10 MG/ML IJ SOLN
10.0000 mg | Freq: Once | INTRAMUSCULAR | Status: AC
  Administered 2023-08-22: 10 mg via INTRAVENOUS
  Filled 2023-08-22: qty 1

## 2023-08-22 MED ORDER — METOCLOPRAMIDE HCL 5 MG/ML IJ SOLN
10.0000 mg | Freq: Once | INTRAMUSCULAR | Status: AC
  Administered 2023-08-22: 10 mg via INTRAVENOUS
  Filled 2023-08-22: qty 2

## 2023-08-22 NOTE — MAU Note (Addendum)
.  Misty Jimenez is a 27 y.o. at [redacted]w[redacted]d here in MAU reporting: CTX's and LOF. She reports around 1230 she went to use the restroom and fluid continued to leak. Clear fluids, no odors. She reports her CTX's began around an hour and a half ago. Reports "stringy" light pink vaginal spotting. +FM.  Seen in MAU on 10/24 for CTX, N/V, and GERD. Cervical exam was Fingertip80-1.   Late PNC. Missed her last prenatal visit on 10/16. Unknown GBS. 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  Onset of complaint: 1230 Pain score: 7/10 lower abdomen  FHT: 150 initial external Lab orders placed from triage: MAU Labor Eval

## 2023-08-22 NOTE — MAU Provider Note (Signed)
Chief Complaint:  Contractions, Rupture of Membranes, Nausea, and Emesis  HPI  Misty Jimenez is a 27 y.o. G1P0000 at [redacted]w[redacted]d who presents to maternity admissions reporting leaking of fluid - she went to the bathroom at 1230 and felt fluid continuing to come out once she finished voiding, clear fluid without odor. Has continued to leak and started having contractions about an hour ago. Notes light pink vaginal spotting with wiping. Also reports a headache unresponsive to tylenol and nausea (which has been an ongoing problem for her this pregnancy). No other physical complaints.   Pregnancy Course: Receives care at [facility], prenatal records reviewed. Of note, has lost almost 32lbs this pregnancy, she reports this is due to her HEG.  Past Medical History:  Diagnosis Date   Abdominal pain, chronic, right lower quadrant 08/03/2013   Abdominal pain, recurrent    With Headache   Acute pyelonephritis 05/07/2016   kidney surgery at 62 months of age   ADHD (attention deficit hyperactivity disorder)    Allergy    Anxiety    Bulimia 04/17/2023   Depression    Endometriosis    Family history of adverse reaction to anesthesia    "grandmother gets PONV"   GE reflux 12/16/2011   Hyperlipidemia    Kidney disease 05/07/2016   LGSIL on Pap smear of cervix 04/13/2023   03/2023 LSIL - PAP in 1 year     Obesity 05/07/2016   PCOS (polycystic ovarian syndrome)    Preventative health care 11/04/2016   Reflux, vesicoureteral    Respiratory illness with fever 12/27/2019   Wrist pain, left 11/04/2016   OB History  Gravida Para Term Preterm AB Living  1 0 0 0 0 0  SAB IAB Ectopic Multiple Live Births  0 0 0 0 0    # Outcome Date GA Lbr Len/2nd Weight Sex Type Anes PTL Lv  1 Current            Past Surgical History:  Procedure Laterality Date   KIDNEY SURGERY     As a small child   TYMPANOSTOMY TUBE PLACEMENT     URETER SURGERY     WRIST SURGERY Left 05/2016   10 pins and 2 plates   Family  History  Problem Relation Age of Onset   Kidney disease Mother    Alcohol abuse Father        and drug use, heroin, cocaine, marijuana   Migraines Maternal Aunt    Cancer Paternal Aunt        colon in 48s deceased   Asthma Maternal Grandmother    Diabetes Maternal Grandfather    Hyperlipidemia Other    Hypertension Other    Depression Other    Stroke Neg Hx    Social History   Tobacco Use   Smoking status: Some Days    Current packs/day: 0.00    Average packs/day: 0.3 packs/day for 2.0 years (0.5 ttl pk-yrs)    Types: Cigarettes    Start date: 10/27/2013    Last attempt to quit: 10/28/2015    Years since quitting: 7.8   Smokeless tobacco: Former    Types: Chew   Tobacco comments:    Pt declines information  Vaping Use   Vaping status: Former   Substances: THC  Substance Use Topics   Alcohol use: Not Currently    Alcohol/week: 2.0 - 5.0 standard drinks of alcohol    Types: 2 - 5 Cans of beer per week    Comment: 3-4  x week   Drug use: Not Currently    Types: Marijuana   Allergies  Allergen Reactions   Sulfa Antibiotics Hives and Rash   Sulfasalazine Hives   Amphetamines     Reports "different parts of her body melt and causes hallucinations."   Other     Fiberglass cast caused rash and hives   Monosodium Glutamate Nausea And Vomiting, Rash and Other (See Comments)    Headache and dizziness   Sulfa Drugs Cross Reactors Rash   Medications Prior to Admission  Medication Sig Dispense Refill Last Dose   aspirin EC 81 MG tablet Take 1 tablet (81 mg total) by mouth daily. Take after 12 weeks for prevention of preeclampsia later in pregnancy 300 tablet 2 08/21/2023   famotidine (PEPCID) 20 MG tablet Take 1 tablet (20 mg total) by mouth 2 (two) times daily. 60 tablet 3 08/21/2023   FLUoxetine (PROZAC) 20 MG capsule Take 1 capsule (20 mg total) by mouth daily. 30 capsule 3 08/21/2023   pantoprazole (PROTONIX) 20 MG tablet Take 1 tablet (20 mg total) by mouth daily. 30 tablet  0 08/21/2023   SUMAtriptan 6 MG/0.5ML SOAJ Inject 6 mg into the skin daily as needed. Inject under the skin at onset of migraine headache. May repeat once in 1 hour if needed. Maximum of 2 injections in 24 hours. 15 mL 6 08/21/2023   cyclobenzaprine (FLEXERIL) 5 MG tablet Take 1 tablet (5 mg total) by mouth 3 (three) times daily as needed for muscle spasms. (Patient not taking: Reported on 07/15/2023) 30 tablet 0    cyclobenzaprine (FLEXERIL) 5 MG tablet Take 1 tablet (5 mg total) by mouth 3 (three) times daily as needed for muscle spasms. (Patient not taking: Reported on 07/15/2023) 20 tablet 0    ondansetron (ZOFRAN) 8 MG tablet Take 1 tablet (8 mg total) by mouth every 8 (eight) hours as needed for nausea or vomiting. 20 tablet 1    prenatal vitamin w/FE, FA (NATACHEW) 29-1 MG CHEW chewable tablet Chew 1 tablet by mouth daily at 12 noon. (Patient not taking: Reported on 06/10/2023)      I have reviewed patient's Past Medical Hx, Surgical Hx, Family Hx, Social Hx, medications and allergies.   ROS  Pertinent items noted in HPI and remainder of comprehensive ROS otherwise negative.   PHYSICAL EXAM  Patient Vitals for the past 24 hrs:  BP Temp Temp src Pulse Resp SpO2 Height Weight  08/22/23 1900 -- -- -- -- -- 99 % -- --  08/22/23 1825 107/61 -- -- 86 18 98 % -- --  08/22/23 1756 123/77 98.8 F (37.1 C) Oral (!) 101 16 99 % 5' 7.25" (1.708 m) 204 lb 8 oz (92.8 kg)   Constitutional: Well-developed, well-nourished female in no acute distress.  Cardiovascular: normal rate & rhythm, warm and well-perfused Respiratory: normal effort, no problems with respiration noted GI: Abd soft, non-tender, non-distended MS: Extremities nontender, no edema, normal ROM Neurologic: Alert and oriented x 4.  GU: no CVA tenderness Pelvic: NEFG, RN collected swabs  Dilation: Fingertip Effacement (%): 40 Cervical Position: Middle Station: -2 Presentation: Undeterminable Exam by:: Felipa Furnace RN  Fetal  Tracing: reactive with palpable and frequent movement Baseline: 150 Variability: moderate Accelerations: 15x15 Decelerations: none Toco: relaxed, pt reports contractions but none witnessed while on MAU, pt asleep after headache meds.   Labs: Results for orders placed or performed during the hospital encounter of 08/22/23 (from the past 24 hour(s))  [facility]  Status: None   Collection Time: 08/22/23  6:30 PM  Result Value Ref Range   POCT Fern Test Negative = intact amniotic membranes   Wet prep, genital     Status: None   Collection Time: 08/22/23  7:16 PM  Result Value Ref Range   Yeast Wet Prep HPF POC NONE SEEN NONE SEEN   Trich, Wet Prep NONE SEEN NONE SEEN   Clue Cells Wet Prep HPF POC NONE SEEN NONE SEEN   WBC, Wet Prep HPF POC <10 <10   Sperm NONE SEEN   Rupture of Membrane (ROM) Plus     Status: None   Collection Time: 08/22/23  7:16 PM  Result Value Ref Range   Rom Plus NEGATIVE    Imaging:  No results found.  MDM & MAU COURSE  MDM:  MAU Course: Orders Placed This Encounter  Procedures   Wet prep, genital   Culture, beta strep (group b only)   Rupture of Membrane (ROM) Plus   Contraction - monitoring   External fetal heart monitoring   Vaginal exam   Fern Test   Discharge patient   Meds ordered this encounter  Medications   lactated ringers bolus 1,000 mL   metoCLOPramide (REGLAN) injection 10 mg   diphenhydrAMINE (BENADRYL) injection 12.5 mg   dexamethasone (DECADRON) injection 10 mg   Swabs collected, all resulted normal except GBS and GC which are still pending. Cervix unchanged. Headache cocktail given which allowed pt to go to sleep. Stable for discharge after IV fluids complete.  ASSESSMENT   1. Pregnancy headache in third trimester   2. Nausea and vomiting in pregnancy   3. Intact amniotic membranes during pregnancy in third trimester   4. NST (non-stress test) reactive   5. [redacted] weeks gestation of pregnancy    PLAN  Discharge home in  stable condition with term labor precautions.     Follow-up Information     [facility] Follow up.   Specialty: Obstetrics and Gynecology Why: as scheduled for ongoing prenatal care Contact information: [address] [place] [address] [phone]                Allergies as of 08/22/2023       Reactions   Sulfa Antibiotics Hives, Rash   Sulfasalazine Hives   Amphetamines    Reports "different parts of her body melt and causes hallucinations."   Other    Fiberglass cast caused rash and hives   Monosodium Glutamate Nausea And Vomiting, Rash, Other (See Comments)   Headache and dizziness   Sulfa Drugs Cross Reactors Rash        Medication List     TAKE these medications    aspirin EC 81 MG tablet Take 1 tablet (81 mg total) by mouth daily. Take after 12 weeks for prevention of preeclampsia later in pregnancy   cyclobenzaprine 5 MG tablet Commonly known as: FLEXERIL Take 1 tablet (5 mg total) by mouth 3 (three) times daily as needed for muscle spasms.   cyclobenzaprine 5 MG tablet Commonly known as: FLEXERIL Take 1 tablet (5 mg total) by mouth 3 (three) times daily as needed for muscle spasms.   famotidine 20 MG tablet Commonly known as: PEPCID Take 1 tablet (20 mg total) by mouth 2 (two) times daily.   FLUoxetine 20 MG capsule Commonly known as: PROzac Take 1 capsule (20 mg total) by mouth daily.   ondansetron 8 MG tablet  Commonly known as: Zofran Take 1 tablet (8 mg total) by mouth every 8 (eight) hours as needed for nausea or vomiting.   pantoprazole 20 MG tablet Commonly known as: Protonix Take 1 tablet (20 mg total) by mouth daily.   prenatal vitamin w/FE, FA 29-1 MG Chew chewable tablet Chew 1 tablet by mouth daily at 12 noon.   SUMAtriptan 6 MG/0.5ML Soaj Inject 6 mg into the skin daily as needed. Inject under the skin at onset of migraine headache. May repeat once  in 1 hour if needed. Maximum of 2 injections in 24 hours.       Edd Arbour, CNM, MSN, IBCLC Certified Nurse Midwife, [facility]

## 2023-08-24 ENCOUNTER — Encounter: Payer: Self-pay | Admitting: Certified Nurse Midwife

## 2023-08-24 DIAGNOSIS — J9601 Acute respiratory failure with hypoxia: Secondary | ICD-10-CM

## 2023-08-24 DIAGNOSIS — O99820 Streptococcus B carrier state complicating pregnancy: Secondary | ICD-10-CM | POA: Insufficient documentation

## 2023-08-24 HISTORY — DX: Acute respiratory failure with hypoxia: J96.01

## 2023-08-24 LAB — GC/CHLAMYDIA PROBE AMP (~~LOC~~) NOT AT ARMC
Chlamydia: NEGATIVE
Comment: NEGATIVE
Comment: NORMAL
Neisseria Gonorrhea: NEGATIVE

## 2023-08-24 LAB — CULTURE, BETA STREP (GROUP B ONLY)

## 2023-08-26 ENCOUNTER — Other Ambulatory Visit: Payer: Self-pay

## 2023-08-26 ENCOUNTER — Ambulatory Visit (INDEPENDENT_AMBULATORY_CARE_PROVIDER_SITE_OTHER): Payer: 59 | Admitting: Family Medicine

## 2023-08-26 ENCOUNTER — Encounter (HOSPITAL_COMMUNITY): Payer: Self-pay | Admitting: *Deleted

## 2023-08-26 ENCOUNTER — Telehealth (HOSPITAL_COMMUNITY): Payer: Self-pay | Admitting: *Deleted

## 2023-08-26 ENCOUNTER — Ambulatory Visit: Payer: 59 | Attending: Maternal & Fetal Medicine

## 2023-08-26 VITALS — BP 116/89 | HR 81 | Wt 204.0 lb

## 2023-08-26 DIAGNOSIS — Z362 Encounter for other antenatal screening follow-up: Secondary | ICD-10-CM | POA: Diagnosis present

## 2023-08-26 DIAGNOSIS — O99213 Obesity complicating pregnancy, third trimester: Secondary | ICD-10-CM

## 2023-08-26 DIAGNOSIS — R87612 Low grade squamous intraepithelial lesion on cytologic smear of cervix (LGSIL): Secondary | ICD-10-CM

## 2023-08-26 DIAGNOSIS — Z3A38 38 weeks gestation of pregnancy: Secondary | ICD-10-CM

## 2023-08-26 DIAGNOSIS — E669 Obesity, unspecified: Secondary | ICD-10-CM

## 2023-08-26 DIAGNOSIS — O0990 Supervision of high risk pregnancy, unspecified, unspecified trimester: Secondary | ICD-10-CM

## 2023-08-26 DIAGNOSIS — O0933 Supervision of pregnancy with insufficient antenatal care, third trimester: Secondary | ICD-10-CM

## 2023-08-26 DIAGNOSIS — O365990 Maternal care for other known or suspected poor fetal growth, unspecified trimester, not applicable or unspecified: Secondary | ICD-10-CM

## 2023-08-26 DIAGNOSIS — O99820 Streptococcus B carrier state complicating pregnancy: Secondary | ICD-10-CM

## 2023-08-26 DIAGNOSIS — O365930 Maternal care for other known or suspected poor fetal growth, third trimester, not applicable or unspecified: Secondary | ICD-10-CM | POA: Diagnosis not present

## 2023-08-26 NOTE — Telephone Encounter (Signed)
Preadmission screen  

## 2023-08-26 NOTE — Progress Notes (Signed)
   PRENATAL VISIT NOTE  Subjective:  Misty Jimenez is a 27 y.o. G1P0000 at [redacted]w[redacted]d being seen today for ongoing prenatal care.  She is currently monitored for the following issues for this high-risk pregnancy and has Obesity in pregnancy, antepartum; Posttraumatic stress disorder; Tobacco use; LGSIL on Pap smear of cervix; Supervision of high risk pregnancy, antepartum; Late prenatal care affecting pregnancy, antepartum; Fetal growth restriction antepartum; ADHD (attention deficit hyperactivity disorder); Bipolar II disorder with rapid cycling (HCC); and GBS (group B Streptococcus carrier), +RV culture, currently pregnant on their problem list.  Patient reports occasional contractions.  Contractions: Irritability. Vag. Bleeding: None.  Movement: Present. Denies leaking of fluid.   The following portions of the patient's history were reviewed and updated as appropriate: allergies, current medications, past family history, past medical history, past social history, past surgical history and problem list.   Objective:   Vitals:   08/26/23 1010  BP: 116/89  Pulse: 81  Weight: 204 lb (92.5 kg)    Fetal Status: Fetal Heart Rate (bpm): 138 Fundal Height: 39 cm Movement: Present  Presentation: Vertex  General:  Alert, oriented and cooperative. Patient is in no acute distress.  Skin: Skin is warm and dry. No rash noted.   Cardiovascular: Normal heart rate noted  Respiratory: Normal respiratory effort, no problems with respiration noted  Abdomen: Soft, gravid, appropriate for gestational age.  Pain/Pressure: Present     Pelvic: Cervical exam deferred Dilation: 1 Effacement (%): 50 Station: -3  Extremities: Normal range of motion.  Edema: None  Mental Status: Normal mood and affect. Normal behavior. Normal judgment and thought content.   Assessment and Plan:  Pregnancy: G1P0000 at [redacted]w[redacted]d 1. Supervision of high risk pregnancy, antepartum FHT normal - CBC; Standing - Type and screen;  Standing - RPR; Standing  2. LGSIL on Pap smear of cervix  3. Fetal growth restriction antepartum MFM recommends induction by 39 weeks even though FGR resolved. - CBC; Standing - Type and screen; Standing - RPR; Standing  4. GBS (group B Streptococcus carrier), +RV culture, currently pregnant Intrapartum PPx  Term labor symptoms and general obstetric precautions including but not limited to vaginal bleeding, contractions, leaking of fluid and fetal movement were reviewed in detail with the patient. Please refer to After Visit Summary for other counseling recommendations.   No follow-ups on file.  Future Appointments  Date Time Provider Department Center  09/01/2023  7:30 AM [facility] US3 [facility]  09/02/2023  7:00 AM [facility] SCHED ROOM [facility] None    Levie Heritage, DO

## 2023-08-28 ENCOUNTER — Inpatient Hospital Stay (HOSPITAL_COMMUNITY)
Admission: AD | Admit: 2023-08-28 | Discharge: 2023-08-28 | Disposition: A | Discharge status: Home / Self Care | Payer: 59 | Attending: Family Medicine | Admitting: Family Medicine

## 2023-08-28 ENCOUNTER — Encounter (HOSPITAL_COMMUNITY): Payer: Self-pay | Admitting: Family Medicine

## 2023-08-28 DIAGNOSIS — O99353 Diseases of the nervous system complicating pregnancy, third trimester: Secondary | ICD-10-CM | POA: Diagnosis not present

## 2023-08-28 DIAGNOSIS — G4486 Cervicogenic headache: Secondary | ICD-10-CM | POA: Insufficient documentation

## 2023-08-28 DIAGNOSIS — R531 Weakness: Secondary | ICD-10-CM | POA: Insufficient documentation

## 2023-08-28 DIAGNOSIS — Z3A38 38 weeks gestation of pregnancy: Secondary | ICD-10-CM | POA: Insufficient documentation

## 2023-08-28 DIAGNOSIS — O212 Late vomiting of pregnancy: Secondary | ICD-10-CM | POA: Insufficient documentation

## 2023-08-28 DIAGNOSIS — Z0371 Encounter for suspected problem with amniotic cavity and membrane ruled out: Secondary | ICD-10-CM | POA: Insufficient documentation

## 2023-08-28 DIAGNOSIS — R197 Diarrhea, unspecified: Secondary | ICD-10-CM | POA: Diagnosis not present

## 2023-08-28 LAB — URINALYSIS, ROUTINE W REFLEX MICROSCOPIC
Bilirubin Urine: NEGATIVE
Glucose, UA: NEGATIVE mg/dL
Hgb urine dipstick: NEGATIVE
Ketones, ur: NEGATIVE mg/dL
Leukocytes,Ua: NEGATIVE
Nitrite: NEGATIVE
Protein, ur: 30 mg/dL — AB
Specific Gravity, Urine: 1.021 (ref 1.005–1.030)
pH: 7 (ref 5.0–8.0)

## 2023-08-28 LAB — PROTEIN / CREATININE RATIO, URINE
Creatinine, Urine: 225 mg/dL
Protein Creatinine Ratio: 0.08 mg/mg{creat} (ref 0.00–0.15)
Total Protein, Urine: 17 mg/dL

## 2023-08-28 LAB — COMPREHENSIVE METABOLIC PANEL
ALT: 37 U/L (ref 0–44)
AST: 49 U/L — ABNORMAL HIGH (ref 15–41)
Albumin: 2.6 g/dL — ABNORMAL LOW (ref 3.5–5.0)
Alkaline Phosphatase: 194 U/L — ABNORMAL HIGH (ref 38–126)
Anion gap: 7 (ref 5–15)
BUN: 6 mg/dL (ref 6–20)
CO2: 22 mmol/L (ref 22–32)
Calcium: 8.7 mg/dL — ABNORMAL LOW (ref 8.9–10.3)
Chloride: 105 mmol/L (ref 98–111)
Creatinine, Ser: 0.72 mg/dL (ref 0.44–1.00)
GFR, Estimated: >60 mL/min (ref >60)
Glucose, Bld: 83 mg/dL (ref 70–99)
Potassium: 3.7 mmol/L (ref 3.5–5.1)
Sodium: 134 mmol/L — ABNORMAL LOW (ref 135–145)
Total Bilirubin: 0.5 mg/dL (ref 0.3–1.2)
Total Protein: 5.6 g/dL — ABNORMAL LOW (ref 6.5–8.1)

## 2023-08-28 LAB — CBC
HCT: 36.7 % (ref 36.0–46.0)
Hemoglobin: 12.6 g/dL (ref 12.0–15.0)
MCH: 30.9 pg (ref 26.0–34.0)
MCHC: 34.3 g/dL (ref 30.0–36.0)
MCV: 90 fL (ref 80.0–100.0)
Platelets: 363 10*3/uL (ref 150–400)
RBC: 4.08 MIL/uL (ref 3.87–5.11)
RDW: 12.8 % (ref 11.5–15.5)
WBC: 11.8 10*3/uL — ABNORMAL HIGH (ref 4.0–10.5)
nRBC: 0 % (ref 0.0–0.2)

## 2023-08-28 LAB — RUPTURE OF MEMBRANE (ROM)PLUS: Rom Plus: NEGATIVE

## 2023-08-28 LAB — POCT FERN TEST: POCT Fern Test: NEGATIVE

## 2023-08-28 MED ORDER — ACETAMINOPHEN-CAFFEINE 500-65 MG PO TABS
2.0000 | ORAL_TABLET | Freq: Once | ORAL | Status: AC
  Administered 2023-08-28: 2 via ORAL
  Filled 2023-08-28: qty 2

## 2023-08-28 MED ORDER — CYCLOBENZAPRINE HCL 5 MG PO TABS
10.0000 mg | ORAL_TABLET | Freq: Once | ORAL | Status: AC
  Administered 2023-08-28: 10 mg via ORAL
  Filled 2023-08-28: qty 2

## 2023-08-28 MED ORDER — CYCLOBENZAPRINE HCL 5 MG PO TABS: 5.0000 mg | ORAL_TABLET | Freq: Three times a day (TID) | ORAL | 0 refills | Status: DC | PRN

## 2023-08-28 MED ORDER — PROMETHAZINE HCL 25 MG/ML IJ SOLN
12.5000 mg | Freq: Once | INTRAMUSCULAR | Status: AC
  Administered 2023-08-28: 12.5 mg via INTRAMUSCULAR
  Filled 2023-08-28: qty 1

## 2023-08-28 NOTE — MAU Note (Signed)
.  Misty Jimenez is a 27 y.o. at [redacted]w[redacted]d here in MAU reporting: reporting possible ROM when she felt a gush of fluid around 0300 this AM. She also noticed blood mixed with discharge at that time, but is not having any now. She is also having intermittent nausea and has not been able to keep anything down for a few days and has had 4 episodes of vomiting today. Reports Reports watery diarrhea that started this morning also, 3 episodes today. intermittent numbness in her legs leading to them "giving out" since last night. She believes that they come with contractions. Also having a HA that is not responding to tylenol - last dose of 500 mg around 0300 today.  Contractions every: 4-5 minutes Onset of ctx: Today Pain score: Pelvic 7/10, HA 6/10 ROM: Possible at 0300 Vaginal Bleeding: Bloody show Last SVE: Last SVE on 10/30: 1/50/-3 Labor Pain Management Plan: Undecided  Fetal Movement: Reports positive FM FHT: Fetal Heart Rate Mode: External Baseline Rate (A): 155 bpm Multiple birth?: No  Vitals:   08/28/23 0815 08/28/23 0816  BP:  (!) 141/77  Pulse:  67  Temp: 97.8 F (36.6 C)      OB Office: Faculty GBS: Positive Lab orders placed from triage: MAU Labor Eval

## 2023-08-28 NOTE — MAU Provider Note (Signed)
MAU Provider Note  Chief Complaint: Rupture of Membranes, Diarrhea, and Extremity Weakness   Provider at bedside 0850     SUBJECTIVE HPI: Misty Jimenez is a 27 y.o. G1P0000 at [redacted]w[redacted]d by LMP who presents to maternity admissions reporting several symptoms: new nausea and vomiting early AM, leaking of clear/blood-tinged fluid since 0300, HA, cramping. Pregnancy c/b obesity, resolved FHR, bipolar II, PTSD, hx substance use. Receives [facility] with [facility].  Patient notes being unable to sleep much last night 2/2 symptoms. Had N/V earlier in pregnancy, however returned this AM. No diarrhea. Notes Zofran is unhelpful for her. Had small amount of clear/blood-tinged fluid leaking since 0300. No large gush. Developed frontal and occipital headache as well that has not responded to tylenol. No hx of headaches or migraines.  +FM.   HPI  Past Medical History:  Diagnosis Date   Abdominal pain, chronic, right lower quadrant 08/03/2013   Abdominal pain, recurrent    With Headache   Acute pyelonephritis 05/07/2016   kidney surgery at 39 months of age   Acute respiratory failure with hypoxia (HCC) 08/24/2023   ADHD (attention deficit hyperactivity disorder)    Allergy    Anxiety    Bipolar affective disorder, current episode manic with psychotic symptoms (HCC) 02/28/2021   "I was so depressed after losing my job, I was thinking of cutting myself."     Bulimia 04/17/2023   Cannabis hyperemesis syndrome concurrent with and due to cannabis abuse (HCC) 10/12/2020   Closed fracture of distal end of left radius 06/19/2016   Current smoker 10/28/2018   Depression    Endometriosis    Endometritis 07/23/2017   Family history of adverse reaction to anesthesia    "grandmother gets PONV"   GE reflux 12/16/2011   Hyperlipidemia    Kidney disease 05/07/2016   Left breast abscess 12/18/2021   LGSIL on Pap smear of cervix 04/13/2023   03/2023 LSIL - PAP in 1 year     Moderate cannabis use disorder (HCC) 10/28/2018    Obesity 05/07/2016   PCOS (polycystic ovarian syndrome)    Preventative health care 11/04/2016   Reflux, vesicoureteral    Respiratory illness with fever 12/27/2019   Wrist pain, left 11/04/2016   Past Surgical History:  Procedure Laterality Date   KIDNEY SURGERY     As a small child   TYMPANOSTOMY TUBE PLACEMENT     URETER SURGERY     WRIST SURGERY Left 05/2016   10 pins and 2 plates   Social History   Socioeconomic History   Marital status: Married    Spouse name: Not on file   Number of children: Not on file   Years of education: Not on file   Highest education level: Not on file  Occupational History   Not on file  Tobacco Use   Smoking status: Some Days    Current packs/day: 0.00    Average packs/day: 0.3 packs/day for 2.0 years (0.5 ttl pk-yrs)    Types: Cigarettes    Start date: 10/27/2013    Last attempt to quit: 10/28/2015    Years since quitting: 7.8   Smokeless tobacco: Former    Types: Chew   Tobacco comments:    Pt declines information  Vaping Use   Vaping status: Former   Substances: THC  Substance and Sexual Activity   Alcohol use: Not Currently    Alcohol/week: 2.0 - 5.0 standard drinks of alcohol    Types: 2 - 5 Cans of beer per week  Comment: 3-4 x week   Drug use: Not Currently    Types: Marijuana   Sexual activity: Yes  Other Topics Concern   Not on file  Social History Narrative   Lives in boyfriend, completing High school   No dietary restrictions has a history of binge eating.. Former smoker, no alcohol or drug use.   Social Determinants of Health   Financial Resource Strain: Medium Risk (11/26/2020)   [facility]   Overall Financial Resource Strain (CARDIA)    Difficulty of Paying Living Expenses: Somewhat hard  Food Insecurity: No Food Insecurity (11/26/2020)   [facility]   Hunger Vital  Sign    Worried About Running Out of Food in the Last Year: Never true    Ran Out of Food in the Last Year: Never true  Transportation Needs: Unmet Transportation Needs (11/26/2020)   [facility]   PRAPARE - Administrator, Civil Service (Medical): Yes    Lack of Transportation (Non-Medical): No  Physical Activity: Insufficiently Active (11/26/2020)   [facility]   Exercise Vital Sign    Days of Exercise per Week: 3 days    Minutes of Exercise per Session: 40 min  Stress: Stress Concern Present (11/26/2020)   [facility]   [employer] of Occupational Health - Occupational Stress Questionnaire    Feeling of Stress : Very much  Social Connections: Unknown (03/07/2022)   [facility]   Social Network    Social Network: Not on file  Intimate Partner Violence: Unknown (01/27/2022)   Received from [facility]   HITS    Physically Hurt: Not on file    Insult or Talk Down To: Not on file    Threaten Physical Harm: Not on file    Scream or Curse: Not on file   No current facility-administered medications on file prior to encounter.   Current Outpatient Medications on File Prior to Encounter  Medication Sig Dispense Refill   FLUoxetine (PROZAC) 20 MG capsule Take 1 capsule (20 mg total) by mouth daily. 30 capsule 3   aspirin EC 81 MG tablet Take 1 tablet (81 mg total) by mouth daily. Take after 12 weeks for prevention of preeclampsia later in pregnancy 300 tablet 2   cyclobenzaprine (FLEXERIL) 5 MG tablet Take 1 tablet (5 mg total) by mouth 3 (three) times daily as needed for muscle spasms. (Patient not taking: Reported on 07/15/2023) 20 tablet 0   famotidine (PEPCID) 20 MG tablet Take 1 tablet (20 mg total) by mouth 2 (two)  times daily. 60 tablet 3   ondansetron (ZOFRAN) 8 MG tablet Take 1 tablet (8 mg total) by mouth every 8 (eight) hours as needed for nausea or vomiting. 20 tablet 1   pantoprazole (PROTONIX) 20 MG tablet Take 1 tablet (20 mg total) by mouth daily. 30 tablet 0   prenatal vitamin w/FE, FA (NATACHEW) 29-1 MG CHEW chewable tablet Chew 1 tablet by mouth daily at 12 noon. (Patient not taking: Reported on 06/10/2023)     SUMAtriptan 6 MG/0.5ML SOAJ Inject 6 mg into the skin daily as needed. Inject under the skin at onset of migraine headache. May repeat once in  1 hour if needed. Maximum of 2 injections in 24 hours. 15 mL 6   Allergies  Allergen Reactions   Sulfa Antibiotics Hives and Rash   Sulfasalazine Hives   Amphetamines     Reports "different parts of her body melt and causes hallucinations."   Other     Fiberglass cast caused rash and hives   Monosodium Glutamate Nausea And Vomiting, Rash and Other (See Comments)    Headache and dizziness   Sulfa Drugs Cross Reactors Rash    ROS:  Pertinent positives/negatives listed above.  I have reviewed patient's Past Medical Hx, Surgical Hx, Family Hx, Social Hx, medications and allergies.   Physical Exam  Patient Vitals for the past 24 hrs:  BP Temp Temp src Pulse Resp SpO2 Height Weight  08/28/23 1045 (!) 99/55 -- -- (!) 56 -- -- -- --  08/28/23 1030 (!) 95/51 -- -- (!) 58 -- -- -- --  08/28/23 0915 96/64 -- -- (!) 57 -- 97 % -- --  08/28/23 0900 (!) 123/48 -- -- (!) 55 -- -- -- --  08/28/23 0845 129/75 -- -- 80 -- -- -- --  08/28/23 0831 134/75 -- -- 64 -- -- -- --  08/28/23 0827 136/82 -- -- 63 -- -- -- --  08/28/23 0816 (!) 141/77 -- -- 67 18 97 % 5\' 7"  (1.702 m) 92.7 kg  08/28/23 0815 -- 97.8 F (36.6 C) Oral -- -- -- -- --   Constitutional: Well-developed, well-nourished female in no acute distress  Cardiovascular: normal rate Respiratory: normal effort GI: Abd soft, non-tender MS: Extremities nontender, no edema, normal  ROM Neurologic: Alert and oriented x 4  GU: Neg CVAT. PELVIC EXAM: Cervix pink, visually 1-2 cm, without lesion, scant white creamy discharge, vaginal walls and external genitalia normal. No pooling, VB  FHT:  Baseline 130, moderate variability, accelerations present, none Contractions: UI  LAB RESULTS Results for orders placed or performed during the hospital encounter of 08/28/23 (from the past 24 hour(s))  Fern Test     Status: None   Collection Time: 08/28/23  8:40 AM  Result Value Ref Range   POCT Fern Test Negative = intact amniotic membranes   Rupture of Membrane (ROM) Plus     Status: None   Collection Time: 08/28/23  8:55 AM  Result Value Ref Range   Rom Plus NEGATIVE   CBC     Status: Abnormal   Collection Time: 08/28/23  9:00 AM  Result Value Ref Range   WBC 11.8 (H) 4.0 - 10.5 K/uL   RBC 4.08 3.87 - 5.11 MIL/uL   Hemoglobin 12.6 12.0 - 15.0 g/dL   HCT 69.6 29.5 - 28.4 %   MCV 90.0 80.0 - 100.0 fL   MCH 30.9 26.0 - 34.0 pg   MCHC 34.3 30.0 - 36.0 g/dL   RDW 13.2 44.0 - 10.2 %   Platelets 363 150 - 400 K/uL   nRBC 0.0 0.0 - 0.2 %  Comprehensive metabolic panel     Status: Abnormal   Collection Time: 08/28/23  9:00 AM  Result Value Ref Range   Sodium 134 (L) 135 - 145 mmol/L   Potassium 3.7 3.5 - 5.1 mmol/L   Chloride 105 98 - 111 mmol/L   CO2 22 22 - 32 mmol/L   Glucose, Bld 83 70 - 99 mg/dL   BUN 6 6 - 20 mg/dL   Creatinine, Ser 7.25 0.44 - 1.00 mg/dL   Calcium 8.7 (L) 8.9 - 10.3 mg/dL  Total Protein 5.6 (L) 6.5 - 8.1 g/dL   Albumin 2.6 (L) 3.5 - 5.0 g/dL   AST 49 (H) 15 - 41 U/L   ALT 37 0 - 44 U/L   Alkaline Phosphatase 194 (H) 38 - 126 U/L   Total Bilirubin 0.5 0.3 - 1.2 mg/dL   GFR, Estimated >22 >02 mL/min   Anion gap 7 5 - 15  Protein / creatinine ratio, urine     Status: None   Collection Time: 08/28/23 10:12 AM  Result Value Ref Range   Creatinine, Urine 225 mg/dL   Total Protein, Urine 17 mg/dL   Protein Creatinine Ratio 0.08 0.00 - 0.15  mg/mg[Cre]  Urinalysis, Routine w reflex microscopic -Urine, Clean Catch     Status: Abnormal   Collection Time: 08/28/23 10:12 AM  Result Value Ref Range   Color, Urine AMBER (A) YELLOW   APPearance HAZY (A) CLEAR   Specific Gravity, Urine 1.021 1.005 - 1.030   pH 7.0 5.0 - 8.0   Glucose, UA NEGATIVE NEGATIVE mg/dL   Hgb urine dipstick NEGATIVE NEGATIVE   Bilirubin Urine NEGATIVE NEGATIVE   Ketones, ur NEGATIVE NEGATIVE mg/dL   Protein, ur 30 (A) NEGATIVE mg/dL   Nitrite NEGATIVE NEGATIVE   Leukocytes,Ua NEGATIVE NEGATIVE   RBC / HPF 0-5 0 - 5 RBC/hpf   WBC, UA 0-5 0 - 5 WBC/hpf   Bacteria, UA RARE (A) NONE SEEN   Squamous Epithelial / HPF 21-50 0 - 5 /HPF   Mucus PRESENT    Non Squamous Epithelial 0-5 (A) NONE SEEN    O/Positive/-- (06/12 0940)  IMAGING [place] MFM FETAL BPP WO NON STRESS  Result Date: 08/26/2023 ----------------------------------------------------------------------  OBSTETRICS REPORT                       (Signed Final 08/26/2023 11:10 am) ---------------------------------------------------------------------- Patient Info  ID #:       [id number]                          D.O.B.:  Apr 26, 1996 (27 yrs)  Name:       Misty Jimenez               Visit Date: 08/26/2023 07:39 am ---------------------------------------------------------------------- Performed By  Attending:        Ma Rings MD         Ref. Address:     [address]  Performed By:     Marcellina Millin       [facility]  Referred By:      [facility] [place] ---------------------------------------------------------------------- Orders  #  Description                           Code        Ordered By  1  [place] MFM FETAL  BPP WO NON               76819.01    CORENTHIAN     STRESS                                            BOOKER ----------------------------------------------------------------------  #  Order #                     Accession #                Episode #  1  [id number]                   [id number]                 [id number] ---------------------------------------------------------------------- Indications  Maternal care for known or suspected poor      O36.5930  fetal growth, third trimester, not applicable or  unspecified IUGR  Obesity complicating pregnancy, third          O99.213  trimester (PG BMI 37)  Late to prenatal care, third trimester         O09.33  [redacted] weeks gestation of pregnancy                Z3A.38  LR female/neg AFP/neg horizon, 2hr GTT  WNL ---------------------------------------------------------------------- Fetal Evaluation  Num Of Fetuses:         1  Fetal Heart Rate(bpm):  144  Cardiac Activity:       Observed  Presentation:           Cephalic  Placenta:               Anterior  P. Cord Insertion:      Previously seen  Amniotic Fluid  AFI FV:      Within normal limits  AFI Sum(cm)     %Tile       Largest Pocket(cm)  8.8             16          2.92  RUQ(cm)       RLQ(cm)       LUQ(cm)        LLQ(cm)  2.92          1.28          1.84           2.76 ---------------------------------------------------------------------- Biophysical Evaluation  Amniotic F.V:   Pocket => 2 cm             F. Tone:        Observed  F. Movement:    Observed  Score:          8/8  F. Breathing:   Observed ---------------------------------------------------------------------- OB History  Blood Type:   O+  Gravidity:    1 ---------------------------------------------------------------------- Gestational Age  LMP:           38w 0d        Date:  12/03/22                   EDD:   09/09/23  Best:          38w 0d     Det. By:  LMP  (12/03/22)          EDD:   09/09/23  ---------------------------------------------------------------------- Comments  This patient was seen for a BPP due to IUGR that was noted  earlier in her pregnancy.  She denies any problems since her  last exam.  A biophysical profile performed today was 8/8.  The AFI was 8.8 cm (within normal limits).  She will return in 1 week for another BPP and growth scan. ----------------------------------------------------------------------                   Ma Rings, MD Electronically Signed Final Report   08/26/2023 11:10 am ----------------------------------------------------------------------   [place] MFM FETAL BPP WO NON STRESS  Result Date: 08/18/2023 ----------------------------------------------------------------------  OBSTETRICS REPORT                       (Signed Final 08/18/2023 11:46 am) ---------------------------------------------------------------------- Patient Info  ID #:       [id number]                          D.O.B.:  [birth year] (27 yrs)  Name:       Misty Jimenez               Visit Date: 08/18/2023 07:26 am ---------------------------------------------------------------------- Performed By  Attending:        Lin Landsman      Ref. Address:     [address]                    [place]                                                             [address]  Performed By:     Dennis Bast RDMS      [facility]  Referred By:      [facility] [place] ---------------------------------------------------------------------- Orders  #  Description                           Code        Ordered By  1  [place] MFM FETAL BPP WO NON               76819.01    CORENTHIAN     STRESS                                            BOOKER  ----------------------------------------------------------------------  #  Order #                     Accession #                Episode #  1  [id number]                   [id number]                 [id number] ---------------------------------------------------------------------- Indications  Maternal care for known or suspected poor      O36.5930  fetal growth, third trimester, not applicable or  unspecified IUGR  Obesity complicating pregnancy, third          O99.213  trimester (PG BMI 37)  Late to prenatal care, third trimester         O09.33  Encounter for other antenatal screening        Z36.2  follow-up  LR female/neg AFP/neg horizon  2hr GTT wnl 06/10/23  [redacted] weeks gestation of pregnancy                Z3A.36 ---------------------------------------------------------------------- Fetal Evaluation  Num Of Fetuses:         1  Fetal Heart Rate(bpm):  159  Cardiac Activity:       Observed  Presentation:           Cephalic  Placenta:               Anterior  P. Cord Insertion:      Previously seen  Amniotic Fluid  AFI FV:      Within normal limits  AFI Sum(cm)     %Tile       Largest Pocket(cm)  8               9           3.03  RUQ(cm)       RLQ(cm)       LUQ(cm)        LLQ(cm)  0             2.82          2.15           3.03 ---------------------------------------------------------------------- Biophysical Evaluation  Amniotic F.V:   Within normal limits       F. Tone:        Observed  F. Movement:    Observed                   Score:          8/8  F. Breathing:   Observed ---------------------------------------------------------------------- OB History  Blood Type:   O+  Gravidity:  1 ---------------------------------------------------------------------- Gestational Age  LMP:           36w 6d        Date:  12/03/22                   EDD:   09/09/23  Best:          Stevie Kern 6d     Det. By:  LMP  (12/03/22)          EDD:   09/09/23 ---------------------------------------------------------------------- Targeted Anatomy   Thorax  4 Chamber View:        Appears normal         Cardiac Activity:       Observed  Abdomen  Stomach:               Appears normal         Rt Kidney:              Appears normal  Lt Kidney:             Appears normal         Bladder:                Appears normal  Comment:     All anatomy previously cleared ---------------------------------------------------------------------- Cervix Uterus Adnexa  Cervix  Not visualized (advanced GA >24wks)  Uterus  No abnormality visualized.  Right Ovary  Not visualized.  Left Ovary  Not visualized.  Cul De Sac  No free fluid seen.  Adnexa  No abnormality visualized ---------------------------------------------------------------------- Impression  Antenatal testing performed given FGR earlier in the current  pregnancy.  The biophysical profile was 8/8 with good fetal movement and  amniotic fluid volume. ---------------------------------------------------------------------- Recommendations  Continue antenatal testing as previoulsy scheduled. ----------------------------------------------------------------------               Lin Landsman, MD Electronically Signed Final Report   08/18/2023 11:46 am ----------------------------------------------------------------------   [place] MFM OB FOLLOW UP  Result Date: 08/11/2023 ----------------------------------------------------------------------  OBSTETRICS REPORT                       (Signed Final 08/11/2023 01:25 pm) ---------------------------------------------------------------------- Patient Info  ID #:       [id number]                          D.O.B.:  January 01, 1996 (27 yrs)  Name:       Misty Jimenez               Visit Date: 08/11/2023 11:04 am ---------------------------------------------------------------------- Performed By  Attending:        Lin Landsman      Ref. Address:     [address]                    [place]                                                             [address]  Performed By:     Anabel Halon           [facility]  Referred By:      [facility] [place] ---------------------------------------------------------------------- Orders  #  Description                           Code        Ordered By  1  [place] MFM OB FOLLOW UP                   76816.01    BURK SCHAIBLE  2  [place] MFM UA CORD DOPPLER                76820.02    BURK SCHAIBLE  3  [place] MFM FETAL BPP WO NON               76819.01    [facility]     STRESS ----------------------------------------------------------------------  #  Order #                     Accession #                Episode #  1  [id number]                   [id number]                 [id number]  2  [id number]                   [id number]                 [id number]  3  [id number]                   [id number]                 [id number] ---------------------------------------------------------------------- Indications  Maternal care for known or suspected poor      O36.5930  fetal growth, third trimester, not applicable or  unspecified IUGR  Obesity complicating pregnancy, third          O99.213  trimester (PG BMI 37)  Late to prenatal care, third trimester         O09.33  [redacted] weeks gestation of pregnancy                Z3A.35  Encounter for other antenatal screening        Z36.2  follow-up  LR female/neg AFP/neg horizon  2hr GTT wnl 06/10/23 ---------------------------------------------------------------------- Fetal Evaluation  Num Of Fetuses:  1  Fetal Heart Rate(bpm):  144  Cardiac Activity:       Observed  Presentation:           Cephalic  Placenta:               Anterior  P. Cord Insertion:      Previously seen  Amniotic Fluid  AFI FV:      Within normal limits  AFI Sum(cm)     %Tile       Largest Pocket(cm)  12.53           40          5.21  RUQ(cm)        RLQ(cm)       LUQ(cm)        LLQ(cm)  5.21          2.36          2.51           2.45 ---------------------------------------------------------------------- Biophysical Evaluation  Amniotic F.V:   Pocket => 2 cm             F. Tone:        Observed  F. Movement:    Observed                   N.S.T:          Reactive  F. Breathing:   Not Observed               Score:          8/10 ---------------------------------------------------------------------- Biometry  BPD:      85.8  mm     G. Age:  34w 4d         22  %    CI:        79.43   %    70 - 86                                                          FL/HC:      21.7   %    20.1 - 22.1  HC:      304.3  mm     G. Age:  33w 6d        1.1  %    HC/AC:      0.99        0.93 - 1.11  AC:      308.3  mm     G. Age:  34w 5d         28  %    FL/BPD:     77.0   %    71 - 87  FL:       66.1  mm     G. Age:  34w 0d          8  %    FL/AC:      21.4   %    20 - 24  Est. FW:    2435  gm      5 lb 6 oz     16  % ---------------------------------------------------------------------- OB History  Blood Type:   O+  Gravidity:    1 ---------------------------------------------------------------------- Gestational Age  LMP:  35w 6d        Date:  12/03/22                   EDD:   09/09/23  U/S Today:     34w 2d                                        EDD:   09/20/23  Best:          35w 6d     Det. By:  LMP  (12/03/22)          EDD:   09/09/23 ---------------------------------------------------------------------- Anatomy  Cranium:               Appears normal         LVOT:                   Previously seen  Cavum:                 Previously seen        Aortic Arch:            Previously seen  Ventricles:            Previously seen        Ductal Arch:            Previously seen  Choroid Plexus:        Previously seen        Diaphragm:              Appears normal  Cerebellum:            Previously seen        Stomach:                Appears normal, left                                                                         sided  Posterior Fossa:       Previously seen        Abdomen:                Previously seen  Nuchal Fold:           Not applicable (>20    Abdominal Wall:         Previously seen                         wks GA)  Face:                  Orbits and profile     Cord Vessels:           Previously seen                         previously seen  Lips:                  Previously seen        Kidneys:  Appear normal  Palate:                Previously seen        Bladder:                Appears normal  Thoracic:              Previously seen        Spine:                  Previously seen  Heart:                 Previously seen        Upper Extremities:      Previously seen  RVOT:                  Previously seen        Lower Extremities:      Previously seen  Other:  Fetus appears to be female prev. Feet, hands, lenses, maxilla,          mandible, falx, VC, 3VV and 3VTV previously visualized. Nasal bone          and heels prev visualized. ---------------------------------------------------------------------- Doppler - Fetal Vessels  Umbilical Artery   S/D     %tile      RI    %tile      PI    %tile     PSV    ADFV    RDFV                                                     (cm/s)   2.52       57     0.6       62    0.96       76    44.82      No      No ---------------------------------------------------------------------- Cervix Uterus Adnexa  Cervix  Not visualized (advanced GA >24wks) ---------------------------------------------------------------------- Impression  Follow up growth due to prior IUGR  Normal interval growth with measurements consistent with  dates, which is now normally growth.  Good fetal movement and amniotic fluid volume  The UA dopplers were normal without evidence of AEDF or  REDF.  BPP was performed that was 8/10  Ms. Noviello left after her BPP of 6/8 to take her husband  home as he wasn't feeling well. She returned in the afternoon  1 pm to have  the NST performed.  Given that the pregnancy had severe FGR in the prior exam  we will continue weekly testing without UA dopplers and  repeat growth in 3 weeks.  Consider delivery between 37-39 weeks with good testing. ---------------------------------------------------------------------- Recommendations  Continue weekly testing  Repeat growth in 3 weeks. ----------------------------------------------------------------------               Lin Landsman, MD Electronically Signed Final Report   08/11/2023 01:25 pm ----------------------------------------------------------------------   [place] MFM UA CORD DOPPLER  Result Date: 08/11/2023 ----------------------------------------------------------------------  OBSTETRICS REPORT                       (Signed Final 08/11/2023 01:25 pm) ---------------------------------------------------------------------- Patient Info  ID #:       [id number]  D.O.B.:  [birth year] (27 yrs)  Name:       Misty Jimenez               Visit Date: 08/11/2023 11:04 am ---------------------------------------------------------------------- Performed By  Attending:        Lin Landsman      Ref. Address:     [address]                    [place]                                                             [address]  Performed By:     Anabel Halon          [facility]                    RD[facility]  Referred By:      [facility] ---------------------------------------------------------------------- Orders  #  Description                           Code        Ordered By  1  [place] MFM OB FOLLOW UP                   38756.43    Braxton Feathers  2  [place] MFM UA CORD DOPPLER                76820.02    BURK SCHAIBLE  3  [place] MFM FETAL BPP WO NON               76819.01    [facility]     STRESS ----------------------------------------------------------------------  #  Order #                     Accession #                Episode #  1  [id number]                   [id number]                 [id number]  2  [id number]  [id number]                 [id number]  3  [id number]                   [id number]                 [id number] ---------------------------------------------------------------------- Indications  Maternal care for known or suspected poor      O36.5930  fetal growth, third trimester, not applicable or  unspecified IUGR  Obesity complicating pregnancy, third          O99.213  trimester (PG BMI 37)  Late to prenatal care, third trimester         O09.33  [redacted] weeks gestation of pregnancy                Z3A.35  Encounter for other antenatal screening        Z36.2  follow-up  LR female/neg AFP/neg horizon  2hr GTT wnl 06/10/23 ---------------------------------------------------------------------- Fetal Evaluation  Num Of Fetuses:         1  Fetal Heart Rate(bpm):  144  Cardiac Activity:       Observed  Presentation:           Cephalic  Placenta:               Anterior  P. Cord Insertion:      Previously seen  Amniotic Fluid  AFI FV:      Within normal limits  AFI Sum(cm)     %Tile       Largest Pocket(cm)  12.53           40          5.21  RUQ(cm)       RLQ(cm)       LUQ(cm)        LLQ(cm)  5.21          2.36          2.51           2.45 ---------------------------------------------------------------------- Biophysical Evaluation  Amniotic F.V:   Pocket => 2 cm             F. Tone:        Observed  F. Movement:    Observed                   N.S.T:          Reactive  F. Breathing:   Not Observed               Score:          8/10 ---------------------------------------------------------------------- Biometry  BPD:      85.8  mm     G. Age:  34w 4d         22  %    CI:        79.43   %    70 - 86                                                          FL/HC:      21.7   %     20.1 - 22.1  HC:      304.3  mm     G. Age:  33w 6d  1.1  %    HC/AC:      0.99        0.93 - 1.11  AC:      308.3  mm     G. Age:  34w 5d         28  %    FL/BPD:     77.0   %    71 - 87  FL:       66.1  mm     G. Age:  34w 0d          8  %    FL/AC:      21.4   %    20 - 24  Est. FW:    2435  gm      5 lb 6 oz     16  % ---------------------------------------------------------------------- OB History  Blood Type:   O+  Gravidity:    1 ---------------------------------------------------------------------- Gestational Age  LMP:           35w 6d        Date:  12/03/22                   EDD:   09/09/23  U/S Today:     34w 2d                                        EDD:   09/20/23  Best:          35w 6d     Det. By:  LMP  (12/03/22)          EDD:   09/09/23 ---------------------------------------------------------------------- Anatomy  Cranium:               Appears normal         LVOT:                   Previously seen  Cavum:                 Previously seen        Aortic Arch:            Previously seen  Ventricles:            Previously seen        Ductal Arch:            Previously seen  Choroid Plexus:        Previously seen        Diaphragm:              Appears normal  Cerebellum:            Previously seen        Stomach:                Appears normal, left                                                                        sided  Posterior Fossa:       Previously seen        Abdomen:  Previously seen  Nuchal Fold:           Not applicable (>20    Abdominal Wall:         Previously seen                         wks GA)  Face:                  Orbits and profile     Cord Vessels:           Previously seen                         previously seen  Lips:                  Previously seen        Kidneys:                Appear normal  Palate:                Previously seen        Bladder:                Appears normal  Thoracic:              Previously seen        Spine:                  Previously  seen  Heart:                 Previously seen        Upper Extremities:      Previously seen  RVOT:                  Previously seen        Lower Extremities:      Previously seen  Other:  Fetus appears to be female prev. Feet, hands, lenses, maxilla,          mandible, falx, VC, 3VV and 3VTV previously visualized. Nasal bone          and heels prev visualized. ---------------------------------------------------------------------- Doppler - Fetal Vessels  Umbilical Artery   S/D     %tile      RI    %tile      PI    %tile     PSV    ADFV    RDFV                                                     (cm/s)   2.52       57     0.6       62    0.96       76    44.82      No      No ---------------------------------------------------------------------- Cervix Uterus Adnexa  Cervix  Not visualized (advanced GA >24wks) ---------------------------------------------------------------------- Impression  Follow up growth due to prior IUGR  Normal interval growth with measurements consistent with  dates, which is now normally growth.  Good fetal movement and amniotic fluid volume  The UA dopplers were normal without evidence of AEDF or  REDF.  BPP was performed that was 8/10  Ms. Brinker left after her  BPP of 6/8 to take her husband  home as he wasn't feeling well. She returned in the afternoon  1 pm to have the NST performed.  Given that the pregnancy had severe FGR in the prior exam  we will continue weekly testing without UA dopplers and  repeat growth in 3 weeks.  Consider delivery between 37-39 weeks with good testing. ---------------------------------------------------------------------- Recommendations  Continue weekly testing  Repeat growth in 3 weeks. ----------------------------------------------------------------------               Lin Landsman, MD Electronically Signed Final Report   08/11/2023 01:25 pm ----------------------------------------------------------------------   [place] MFM FETAL BPP WO NON  STRESS  Result Date: 08/11/2023 ----------------------------------------------------------------------  OBSTETRICS REPORT                       (Signed Final 08/11/2023 01:25 pm) ---------------------------------------------------------------------- Patient Info  ID #:       [id number]                          D.O.B.:  [birth year] (27 yrs)  Name:       Misty Jimenez               Visit Date: 08/11/2023 11:04 am ---------------------------------------------------------------------- Performed By  Attending:        Lin Landsman      Ref. Address:     [address]                    [place]                                                             [address]  Performed By:     Anabel Halon          [facility]                    RDMS                                     [facility]  Referred By:      [facility] [place] ---------------------------------------------------------------------- Orders  #  Description                           Code        Ordered By  1  [place]  MFM OB FOLLOW UP                   76816.01    BURK SCHAIBLE  2  [place] MFM UA CORD DOPPLER                76820.02    BURK SCHAIBLE  3  [place] MFM FETAL BPP WO NON               76819.01    [facility]     STRESS ----------------------------------------------------------------------  #  Order #                     Accession #                Episode #  1  [id number]                   [id number]                 [id number]  2  [id number]                   [id number]                 [id number]  3  [id number]                   [id number]                 [id number] ---------------------------------------------------------------------- Indications  Maternal care for known or suspected poor      O36.5930  fetal growth, third trimester, not applicable or  unspecified IUGR  Obesity complicating pregnancy, third           O99.213  trimester (PG BMI 37)  Late to prenatal care, third trimester         O09.33  [redacted] weeks gestation of pregnancy                Z3A.35  Encounter for other antenatal screening        Z36.2  follow-up  LR female/neg AFP/neg horizon  2hr GTT wnl 06/10/23 ---------------------------------------------------------------------- Fetal Evaluation  Num Of Fetuses:         1  Fetal Heart Rate(bpm):  144  Cardiac Activity:       Observed  Presentation:           Cephalic  Placenta:               Anterior  P. Cord Insertion:      Previously seen  Amniotic Fluid  AFI FV:      Within normal limits  AFI Sum(cm)     %Tile       Largest Pocket(cm)  12.53           40          5.21  RUQ(cm)       RLQ(cm)       LUQ(cm)        LLQ(cm)  5.21          2.36          2.51           2.45 ---------------------------------------------------------------------- Biophysical Evaluation  Amniotic F.V:   Pocket => 2 cm             F. Tone:        Observed  F. Movement:    Observed  N.S.T:          Reactive  F. Breathing:   Not Observed               Score:          8/10 ---------------------------------------------------------------------- Biometry  BPD:      85.8  mm     G. Age:  34w 4d         22  %    CI:        79.43   %    70 - 86                                                          FL/HC:      21.7   %    20.1 - 22.1  HC:      304.3  mm     G. Age:  33w 6d        1.1  %    HC/AC:      0.99        0.93 - 1.11  AC:      308.3  mm     G. Age:  34w 5d         28  %    FL/BPD:     77.0   %    71 - 87  FL:       66.1  mm     G. Age:  34w 0d          8  %    FL/AC:      21.4   %    20 - 24  Est. FW:    2435  gm      5 lb 6 oz     16  % ---------------------------------------------------------------------- OB History  Blood Type:   O+  Gravidity:    1 ---------------------------------------------------------------------- Gestational Age  LMP:           35w 6d        Date:  12/03/22                   EDD:   09/09/23  U/S  Today:     34w 2d                                        EDD:   09/20/23  Best:          35w 6d     Det. By:  LMP  (12/03/22)          EDD:   09/09/23 ---------------------------------------------------------------------- Anatomy  Cranium:               Appears normal         LVOT:                   Previously seen  Cavum:                 Previously seen        Aortic Arch:            Previously seen  Ventricles:            Previously  seen        Ductal Arch:            Previously seen  Choroid Plexus:        Previously seen        Diaphragm:              Appears normal  Cerebellum:            Previously seen        Stomach:                Appears normal, left                                                                        sided  Posterior Fossa:       Previously seen        Abdomen:                Previously seen  Nuchal Fold:           Not applicable (>20    Abdominal Wall:         Previously seen                         wks GA)  Face:                  Orbits and profile     Cord Vessels:           Previously seen                         previously seen  Lips:                  Previously seen        Kidneys:                Appear normal  Palate:                Previously seen        Bladder:                Appears normal  Thoracic:              Previously seen        Spine:                  Previously seen  Heart:                 Previously seen        Upper Extremities:      Previously seen  RVOT:                  Previously seen        Lower Extremities:      Previously seen  Other:  Fetus appears to be female prev. Feet, hands, lenses, maxilla,          mandible, falx, VC, 3VV and 3VTV previously visualized. Nasal bone          and heels prev visualized. ---------------------------------------------------------------------- Doppler - Fetal Vessels  Umbilical Artery   S/D     %tile  RI    %tile      PI    %tile     PSV    ADFV    RDFV                                                     (cm/s)   2.52        57     0.6       62    0.96       76    44.82      No      No ---------------------------------------------------------------------- Cervix Uterus Adnexa  Cervix  Not visualized (advanced GA >24wks) ---------------------------------------------------------------------- Impression  Follow up growth due to prior IUGR  Normal interval growth with measurements consistent with  dates, which is now normally growth.  Good fetal movement and amniotic fluid volume  The UA dopplers were normal without evidence of AEDF or  REDF.  BPP was performed that was 8/10  Ms. Wieting left after her BPP of 6/8 to take her husband  home as he wasn't feeling well. She returned in the afternoon  1 pm to have the NST performed.  Given that the pregnancy had severe FGR in the prior exam  we will continue weekly testing without UA dopplers and  repeat growth in 3 weeks.  Consider delivery between 37-39 weeks with good testing. ---------------------------------------------------------------------- Recommendations  Continue weekly testing  Repeat growth in 3 weeks. ----------------------------------------------------------------------               Lin Landsman, MD Electronically Signed Final Report   08/11/2023 01:25 pm ----------------------------------------------------------------------   [place] MFM UA CORD DOPPLER  Result Date: 08/04/2023 ----------------------------------------------------------------------  OBSTETRICS REPORT                       (Signed Final 08/04/2023 11:39 am) ---------------------------------------------------------------------- Patient Info  ID #:       [id number]                          D.O.B.:  1996-09-13 (27 yrs)  Name:       Misty Jimenez               Visit Date: 08/04/2023 09:33 am ---------------------------------------------------------------------- Performed By  Attending:        Ma Rings MD         Ref. Address:     [address]                                                              [address]  Performed By:     Alain Marion     [facility]                    RDMS                                     [facility]  Referred By:      [facility] [place] ---------------------------------------------------------------------- Orders  #  Description                           Code        Ordered By  1  [place] MFM UA CORD DOPPLER                76820.02    Braxton Feathers  2  [place] MFM FETAL BPP WO NON               76819.01    [facility]     STRESS ----------------------------------------------------------------------  #  Order #                     Accession #                Episode #  1  [id number]                   [id number]                 [id number]  2  [id number]                   [id number]                 [id number] ---------------------------------------------------------------------- Indications  Maternal care for known or suspected poor      O36.5930  fetal growth, third trimester, not applicable or  unspecified IUGR  Obesity complicating pregnancy, third          O99.213  trimester (PG BMI 37)  Late to prenatal care, third trimester         O09.33  Encounter for other antenatal screening        Z36.2  follow-up  LR female/neg AFP/neg horizon  2hr GTT wnl 06/10/23  [redacted] weeks gestation of pregnancy                Z3A.34 ---------------------------------------------------------------------- Fetal Evaluation  Num Of Fetuses:         1  Fetal Heart Rate(bpm):  124  Cardiac Activity:       Observed  Presentation:           Cephalic  Placenta:               Anterior  Amniotic Fluid  AFI FV:      Within normal limits  AFI Sum(cm)     %Tile       Largest Pocket(cm)  11.59           32          3.45  RUQ(cm)       RLQ(cm)       LUQ(cm)        LLQ(cm)  2.33          3.45          2.65           3.16  ---------------------------------------------------------------------- Biophysical Evaluation  Amniotic F.V:   Within normal limits  F. Tone:        Observed  F. Movement:    Observed                   Score:          8/8  F. Breathing:   Observed ---------------------------------------------------------------------- OB History  Blood Type:   O+  Gravidity:    1 ---------------------------------------------------------------------- Gestational Age  LMP:           34w 6d        Date:  12/03/22                   EDD:   09/09/23  Best:          34w 6d     Det. By:  LMP  (12/03/22)          EDD:   09/09/23 ---------------------------------------------------------------------- Anatomy  Kidneys:               Appear normal          Bladder:                Appears normal ---------------------------------------------------------------------- Doppler - Fetal Vessels  Umbilical Artery   S/D     %tile      RI    %tile      PI    %tile     PSV    ADFV    RDFV                                                     (cm/s)   2.97       78    0.66       81    1.04       83    55.56      No      No ---------------------------------------------------------------------- Comments  This patient was seen due to an IUGR fetus.  She continues  to complain of persistent nausea and vomiting associated  with pregnancy.  However, she has been able to tolerate  more food over the past week.  A BPP performed today was 8/8.  The total AFI was 11.59 cm (within normal limits).  Doppler studies of the umbilical arteries performed due to  fetal growth restriction showed a normal S/D ratio of 2.97.  There were no signs of absent or reversed end-diastolic flow  noted today.  The patient will return in 1 week for another growth scan,  BPP, and umbilical artery Doppler study.  The patient understands that should IUGR continue to be  noted next week, delivery may be recommended at between  37 to 38 weeks.  ----------------------------------------------------------------------                   Ma Rings, MD Electronically Signed Final Report   08/04/2023 11:39 am ----------------------------------------------------------------------   [place] MFM FETAL BPP WO NON STRESS  Result Date: 08/04/2023 ----------------------------------------------------------------------  OBSTETRICS REPORT                       (Signed Final 08/04/2023 11:39 am) ---------------------------------------------------------------------- Patient Info  ID #:       [id number]  D.O.B.:  August 14, 1996 (27 yrs)  Name:       Misty Jimenez               Visit Date: 08/04/2023 09:33 am ---------------------------------------------------------------------- Performed By  Attending:        Ma Rings MD         Ref. Address:     [address]  Performed By:     Alain Marion     [facility]                    RDMS                                     [facility]  Referred By:      [facility] [place] ---------------------------------------------------------------------- Orders  #  Description                           Code        Ordered By  1  [place] MFM UA CORD DOPPLER                76820.02    BURK SCHAIBLE  2  [place] MFM FETAL BPP WO NON               76819.01    [facility]     STRESS ----------------------------------------------------------------------  #  Order #                     Accession #                Episode #  1  [id number]                   [id number]                 [id number]  2  [id number]                   [id number]                 [id number] ---------------------------------------------------------------------- Indications  Maternal care for known or suspected poor      O36.5930  fetal  growth, third trimester, not applicable  or  unspecified IUGR  Obesity complicating pregnancy, third          O99.213  trimester (PG BMI 37)  Late to prenatal care, third trimester         O09.33  Encounter for other antenatal screening        Z36.2  follow-up  LR female/neg AFP/neg horizon  2hr GTT wnl 06/10/23  [redacted] weeks gestation of pregnancy                Z3A.34 ---------------------------------------------------------------------- Fetal Evaluation  Num Of Fetuses:         1  Fetal Heart Rate(bpm):  124  Cardiac Activity:       Observed  Presentation:           Cephalic  Placenta:               Anterior  Amniotic Fluid  AFI FV:      Within normal limits  AFI Sum(cm)     %Tile       Largest Pocket(cm)  11.59           32          3.45  RUQ(cm)       RLQ(cm)       LUQ(cm)        LLQ(cm)  2.33          3.45          2.65           3.16 ---------------------------------------------------------------------- Biophysical Evaluation  Amniotic F.V:   Within normal limits       F. Tone:        Observed  F. Movement:    Observed                   Score:          8/8  F. Breathing:   Observed ---------------------------------------------------------------------- OB History  Blood Type:   O+  Gravidity:    1 ---------------------------------------------------------------------- Gestational Age  LMP:           34w 6d        Date:  12/03/22                   EDD:   09/09/23  Best:          34w 6d     Det. By:  LMP  (12/03/22)          EDD:   09/09/23 ---------------------------------------------------------------------- Anatomy  Kidneys:               Appear normal          Bladder:                Appears normal ---------------------------------------------------------------------- Doppler - Fetal Vessels  Umbilical Artery   S/D     %tile      RI    %tile      PI    %tile     PSV    ADFV    RDFV                                                     (cm/s)   2.97       78    0.66       81  1.04       83    55.56      No      No  ---------------------------------------------------------------------- Comments  This patient was seen due to an IUGR fetus.  She continues  to complain of persistent nausea and vomiting associated  with pregnancy.  However, she has been able to tolerate  more food over the past week.  A BPP performed today was 8/8.  The total AFI was 11.59 cm (within normal limits).  Doppler studies of the umbilical arteries performed due to  fetal growth restriction showed a normal S/D ratio of 2.97.  There were no signs of absent or reversed end-diastolic flow  noted today.  The patient will return in 1 week for another growth scan,  BPP, and umbilical artery Doppler study.  The patient understands that should IUGR continue to be  noted next week, delivery may be recommended at between  37 to 38 weeks. ----------------------------------------------------------------------                   Ma Rings, MD Electronically Signed Final Report   08/04/2023 11:39 am ----------------------------------------------------------------------    MAU Management/MDM: Orders Placed This Encounter  Procedures   CBC   Comprehensive metabolic panel   Protein / creatinine ratio, urine   Urinalysis, Routine w reflex microscopic -Urine, Clean Catch   Rupture of Membrane (ROM) Plus   Fern Test   Discharge patient    Meds ordered this encounter  Medications   acetaminophen-caffeine (EXCEDRIN TENSION HEADACHE) 500-65 MG per tablet 2 tablet   promethazine (PHENERGAN) injection 12.5 mg   cyclobenzaprine (FLEXERIL) tablet 10 mg   cyclobenzaprine (FLEXERIL) 5 MG tablet    Sig: Take 1 tablet (5 mg total) by mouth 3 (three) times daily as needed for muscle spasms.    Dispense:  30 tablet    Refill:  0     Available prenatal records reviewed.  Pt with multiple concerns: - LOF: need to rule-out PROM. SSE performed without pooling. Fern, ROM plus obtained - N/V: Phenergan IM as this has been most helpful for her in the past - HA:  initial BP in mild range. Given unresponsive to tylenol, will perform preE rule-out. Excedrin tension, flexeril for symptoms  1100: Patient reassessed at the bedside. Feels much improved with headache nearly resolved. Bps all following wnl, and preE ruled-out. PROM ruled-out with negative fern and ROM plus. No further nausea/vomiting. Sent with flexeril for tension headache. Has induction scheduled 11/6. Return precautions/labor s/sx discussed.  Reactive FHT throughout.  ASSESSMENT 1. Cervicogenic headache   2. Encounter for suspected premature rupture of amniotic membranes, with rupture of membranes not found   3. [redacted] weeks gestation of pregnancy     PLAN Discharge home with strict return precautions. Allergies as of 08/28/2023       Reactions   Sulfa Antibiotics Hives, Rash   Sulfasalazine Hives   Amphetamines    Reports "different parts of her body melt and causes hallucinations."   Other    Fiberglass cast caused rash and hives   Monosodium Glutamate Nausea And Vomiting, Rash, Other (See Comments)   Headache and dizziness   Sulfa Drugs Cross Reactors Rash        Medication List     TAKE these medications    aspirin EC 81 MG tablet Take 1 tablet (81 mg total) by mouth daily. Take after 12 weeks for prevention of preeclampsia later in pregnancy   cyclobenzaprine 5 MG tablet Commonly known  as: FLEXERIL Take 1 tablet (5 mg total) by mouth 3 (three) times daily as needed for muscle spasms.   cyclobenzaprine 5 MG tablet Commonly known as: FLEXERIL Take 1 tablet (5 mg total) by mouth 3 (three) times daily as needed for muscle spasms.   famotidine 20 MG tablet Commonly known as: PEPCID Take 1 tablet (20 mg total) by mouth 2 (two) times daily.   FLUoxetine 20 MG capsule Commonly known as: PROzac Take 1 capsule (20 mg total) by mouth daily.   ondansetron 8 MG tablet Commonly known as: Zofran Take 1 tablet (8 mg total) by mouth every 8 (eight) hours as needed for nausea  or vomiting.   pantoprazole 20 MG tablet Commonly known as: Protonix Take 1 tablet (20 mg total) by mouth daily.   prenatal vitamin w/FE, FA 29-1 MG Chew chewable tablet Chew 1 tablet by mouth daily at 12 noon.   SUMAtriptan 6 MG/0.5ML Soaj Inject 6 mg into the skin daily as needed. Inject under the skin at onset of migraine headache. May repeat once in 1 hour if needed. Maximum of 2 injections in 24 hours.         Wylene Simmer, MD OB Fellow 08/28/2023  11:07 AM

## 2023-08-31 ENCOUNTER — Other Ambulatory Visit: Payer: Self-pay

## 2023-08-31 ENCOUNTER — Encounter (HOSPITAL_COMMUNITY): Payer: Self-pay | Admitting: Obstetrics and Gynecology

## 2023-08-31 ENCOUNTER — Inpatient Hospital Stay (HOSPITAL_COMMUNITY)
Admission: AD | Admit: 2023-08-31 | Discharge: 2023-08-31 | Disposition: A | Discharge status: Home / Self Care | Payer: 59 | Source: Home / Self Care | Attending: Obstetrics and Gynecology | Admitting: Obstetrics and Gynecology

## 2023-08-31 DIAGNOSIS — O471 False labor at or after 37 completed weeks of gestation: Secondary | ICD-10-CM | POA: Insufficient documentation

## 2023-08-31 DIAGNOSIS — O479 False labor, unspecified: Secondary | ICD-10-CM | POA: Diagnosis not present

## 2023-08-31 DIAGNOSIS — Z3A38 38 weeks gestation of pregnancy: Secondary | ICD-10-CM

## 2023-08-31 DIAGNOSIS — Z0371 Encounter for suspected problem with amniotic cavity and membrane ruled out: Secondary | ICD-10-CM | POA: Insufficient documentation

## 2023-08-31 DIAGNOSIS — O365930 Maternal care for other known or suspected poor fetal growth, third trimester, not applicable or unspecified: Secondary | ICD-10-CM | POA: Diagnosis not present

## 2023-08-31 LAB — POCT FERN TEST: POCT Fern Test: NEGATIVE

## 2023-08-31 LAB — RUPTURE OF MEMBRANE (ROM)PLUS: Rom Plus: NEGATIVE

## 2023-08-31 NOTE — MAU Provider Note (Incomplete)
  S: Ms. KASY IANNACONE is a 26 y.o. G1P0000 at [redacted]w[redacted]d  who presents to MAU today complaining contractions q 5-6 minutes since 1530. She denies vaginal bleeding. She endorses LOF at 1900 with continued leakage - "smelled sweet". She reports normal fetal movement.    O: BP (!) 110/55   Pulse 74   Temp 97.9 F (36.6 C) (Oral)   Resp 19   Ht 5\' 7"  (1.702 m)   Wt 93.2 kg   LMP 12/03/2022 (Approximate)   SpO2 96%   BMI 32.19 kg/m  GENERAL: Well-developed, well-nourished female in no acute distress.  HEAD: Normocephalic, atraumatic.  CHEST: Normal effort of breathing, regular heart rate ABDOMEN: Soft, nontender, gravid  Cervical exam:  Dilation: 1 Effacement (%): Thick Cervical Position: Posterior Station: Ballotable Exam by:: N. Romeo Apple, RN  Results for orders placed or performed during the hospital encounter of 08/31/23 (from the past 24 hour(s))  Rupture of Membrane (ROM) Plus     Status: None   Collection Time: 08/31/23  9:41 PM  Result Value Ref Range   Rom Plus NEGATIVE   POCT fern test     Status: None   Collection Time: 08/31/23  9:48 PM  Result Value Ref Range   POCT Fern Test Negative = intact amniotic membranes    Fetal Monitoring: Baseline: 130 Variability: minimal Accelerations:  Decelerations: absent Contractions: Rare  A: SIUP at [redacted]w[redacted]d  False labor Intact amniotic membranes during 3rd trimester  P: Discharge home Return precautions provided Continue routine prenatal care as scheduled  Wyn Forster, MD 08/31/2023 9:40 PM

## 2023-08-31 NOTE — MAU Note (Signed)
.  Misty Jimenez is a 27 y.o. at [redacted]w[redacted]d here in MAU reporting: around 45 had a gush of fluid - clear and has continued to leak fluid "smelled sweet". Denies VB. Reports DFM - last movement felt at 1530. Ctx 5-6 minutes since ROM. Also states she feels faint and weak.   Pain score: 7 Vitals:   08/31/23 2038  BP: 116/69  Pulse: 85  Resp: 19  Temp: 97.9 F (36.6 C)  SpO2: 96%     FHT:140 Lab orders placed from triage:  fern

## 2023-09-01 ENCOUNTER — Encounter (HOSPITAL_COMMUNITY): Payer: Self-pay | Admitting: Family Medicine

## 2023-09-01 ENCOUNTER — Ambulatory Visit: Payer: 59

## 2023-09-01 ENCOUNTER — Inpatient Hospital Stay (HOSPITAL_COMMUNITY)
Admission: AD | Admit: 2023-09-01 | Discharge: 2023-09-04 | DRG: 806 | Disposition: A | Discharge status: Home / Self Care | Payer: 59 | Attending: Family Medicine | Admitting: Family Medicine

## 2023-09-01 DIAGNOSIS — O365930 Maternal care for other known or suspected poor fetal growth, third trimester, not applicable or unspecified: Secondary | ICD-10-CM

## 2023-09-01 DIAGNOSIS — Z888 Allergy status to other drugs, medicaments and biological substances status: Secondary | ICD-10-CM | POA: Diagnosis not present

## 2023-09-01 DIAGNOSIS — Z23 Encounter for immunization: Secondary | ICD-10-CM

## 2023-09-01 DIAGNOSIS — Z91018 Allergy to other foods: Secondary | ICD-10-CM | POA: Diagnosis not present

## 2023-09-01 DIAGNOSIS — O99344 Other mental disorders complicating childbirth: Secondary | ICD-10-CM | POA: Diagnosis present

## 2023-09-01 DIAGNOSIS — Z811 Family history of alcohol abuse and dependence: Secondary | ICD-10-CM | POA: Diagnosis not present

## 2023-09-01 DIAGNOSIS — Z8249 Family history of ischemic heart disease and other diseases of the circulatory system: Secondary | ICD-10-CM

## 2023-09-01 DIAGNOSIS — Z79899 Other long term (current) drug therapy: Secondary | ICD-10-CM

## 2023-09-01 DIAGNOSIS — O99213 Obesity complicating pregnancy, third trimester: Secondary | ICD-10-CM | POA: Diagnosis not present

## 2023-09-01 DIAGNOSIS — Z825 Family history of asthma and other chronic lower respiratory diseases: Secondary | ICD-10-CM | POA: Diagnosis not present

## 2023-09-01 DIAGNOSIS — O99210 Obesity complicating pregnancy, unspecified trimester: Secondary | ICD-10-CM | POA: Diagnosis present

## 2023-09-01 DIAGNOSIS — Z841 Family history of disorders of kidney and ureter: Secondary | ICD-10-CM | POA: Diagnosis not present

## 2023-09-01 DIAGNOSIS — O99214 Obesity complicating childbirth: Secondary | ICD-10-CM | POA: Diagnosis present

## 2023-09-01 DIAGNOSIS — Z818 Family history of other mental and behavioral disorders: Secondary | ICD-10-CM | POA: Diagnosis not present

## 2023-09-01 DIAGNOSIS — F3181 Bipolar II disorder: Secondary | ICD-10-CM | POA: Diagnosis present

## 2023-09-01 DIAGNOSIS — O0933 Supervision of pregnancy with insufficient antenatal care, third trimester: Secondary | ICD-10-CM

## 2023-09-01 DIAGNOSIS — Z882 Allergy status to sulfonamides status: Secondary | ICD-10-CM

## 2023-09-01 DIAGNOSIS — O0990 Supervision of high risk pregnancy, unspecified, unspecified trimester: Secondary | ICD-10-CM

## 2023-09-01 DIAGNOSIS — O99820 Streptococcus B carrier state complicating pregnancy: Secondary | ICD-10-CM | POA: Diagnosis not present

## 2023-09-01 DIAGNOSIS — K219 Gastro-esophageal reflux disease without esophagitis: Secondary | ICD-10-CM | POA: Diagnosis present

## 2023-09-01 DIAGNOSIS — O99334 Smoking (tobacco) complicating childbirth: Secondary | ICD-10-CM | POA: Diagnosis present

## 2023-09-01 DIAGNOSIS — E669 Obesity, unspecified: Secondary | ICD-10-CM | POA: Diagnosis not present

## 2023-09-01 DIAGNOSIS — Z3A38 38 weeks gestation of pregnancy: Secondary | ICD-10-CM

## 2023-09-01 DIAGNOSIS — Z833 Family history of diabetes mellitus: Secondary | ICD-10-CM

## 2023-09-01 DIAGNOSIS — F17200 Nicotine dependence, unspecified, uncomplicated: Secondary | ICD-10-CM | POA: Diagnosis present

## 2023-09-01 DIAGNOSIS — O365990 Maternal care for other known or suspected poor fetal growth, unspecified trimester, not applicable or unspecified: Secondary | ICD-10-CM | POA: Diagnosis present

## 2023-09-01 DIAGNOSIS — O99824 Streptococcus B carrier state complicating childbirth: Secondary | ICD-10-CM | POA: Diagnosis present

## 2023-09-01 DIAGNOSIS — Z30017 Encounter for initial prescription of implantable subdermal contraceptive: Secondary | ICD-10-CM | POA: Diagnosis not present

## 2023-09-01 DIAGNOSIS — Z5986 Financial insecurity: Secondary | ICD-10-CM

## 2023-09-01 DIAGNOSIS — Z3490 Encounter for supervision of normal pregnancy, unspecified, unspecified trimester: Principal | ICD-10-CM | POA: Diagnosis present

## 2023-09-01 DIAGNOSIS — F17210 Nicotine dependence, cigarettes, uncomplicated: Secondary | ICD-10-CM | POA: Diagnosis present

## 2023-09-01 DIAGNOSIS — O9962 Diseases of the digestive system complicating childbirth: Secondary | ICD-10-CM | POA: Diagnosis present

## 2023-09-01 DIAGNOSIS — Z362 Encounter for other antenatal screening follow-up: Secondary | ICD-10-CM | POA: Insufficient documentation

## 2023-09-01 DIAGNOSIS — F909 Attention-deficit hyperactivity disorder, unspecified type: Secondary | ICD-10-CM | POA: Diagnosis present

## 2023-09-01 DIAGNOSIS — Z720 Tobacco use: Secondary | ICD-10-CM | POA: Diagnosis present

## 2023-09-01 LAB — RAPID URINE DRUG SCREEN, HOSP PERFORMED
Amphetamines: NOT DETECTED
Barbiturates: NOT DETECTED
Benzodiazepines: NOT DETECTED
Cocaine: NOT DETECTED
Opiates: NOT DETECTED
Tetrahydrocannabinol: POSITIVE — AB

## 2023-09-01 LAB — TYPE AND SCREEN
ABO/RH(D): O POS
Antibody Screen: NEGATIVE

## 2023-09-01 LAB — CBC
HCT: 38.6 % (ref 36.0–46.0)
Hemoglobin: 13.3 g/dL (ref 12.0–15.0)
MCH: 31.3 pg (ref 26.0–34.0)
MCHC: 34.5 g/dL (ref 30.0–36.0)
MCV: 90.8 fL (ref 80.0–100.0)
Platelets: 412 10*3/uL — ABNORMAL HIGH (ref 150–400)
RBC: 4.25 MIL/uL (ref 3.87–5.11)
RDW: 12.9 % (ref 11.5–15.5)
WBC: 11.7 10*3/uL — ABNORMAL HIGH (ref 4.0–10.5)
nRBC: 0 % (ref 0.0–0.2)

## 2023-09-01 LAB — RPR: RPR Ser Ql: NONREACTIVE

## 2023-09-01 MED ORDER — OXYTOCIN-SODIUM CHLORIDE 30-0.9 UT/500ML-% IV SOLN
2.5000 [IU]/h | INTRAVENOUS | Status: DC
Start: 2023-09-01 — End: 2023-09-02

## 2023-09-01 MED ORDER — DIPHENHYDRAMINE HCL 50 MG/ML IJ SOLN
12.5000 mg | Freq: Once | INTRAMUSCULAR | Status: AC
  Administered 2023-09-01: 12.5 mg via INTRAVENOUS
  Filled 2023-09-01: qty 1

## 2023-09-01 MED ORDER — LACTATED RINGERS IV SOLN: 500.0000 mL | INTRAVENOUS | Status: DC | PRN

## 2023-09-01 MED ORDER — MISOPROSTOL 25 MCG QUARTER TABLET: 25.0000 ug | ORAL_TABLET | ORAL | Status: DC | PRN

## 2023-09-01 MED ORDER — OXYCODONE-ACETAMINOPHEN 5-325 MG PO TABS: 2.0000 | ORAL_TABLET | ORAL | Status: DC | PRN

## 2023-09-01 MED ORDER — PENICILLIN G POT IN DEXTROSE 60000 UNIT/ML IV SOLN
3.0000 10*6.[IU] | INTRAVENOUS | Status: DC
  Administered 2023-09-01 – 2023-09-02 (×5): 3 10*6.[IU] via INTRAVENOUS
  Filled 2023-09-01 (×5): qty 50

## 2023-09-01 MED ORDER — MISOPROSTOL 25 MCG QUARTER TABLET
25.0000 ug | ORAL_TABLET | ORAL | Status: DC | PRN
  Administered 2023-09-01: 25 ug via ORAL
  Filled 2023-09-01: qty 1

## 2023-09-01 MED ORDER — FENTANYL CITRATE (PF) 100 MCG/2ML IJ SOLN
100.0000 ug | INTRAMUSCULAR | Status: DC | PRN
  Administered 2023-09-01: 100 ug via INTRAVENOUS
  Filled 2023-09-01: qty 2

## 2023-09-01 MED ORDER — SOD CITRATE-CITRIC ACID 500-334 MG/5ML PO SOLN: 30.0000 mL | ORAL | Status: DC | PRN

## 2023-09-01 MED ORDER — OXYTOCIN BOLUS FROM INFUSION
333.0000 mL | Freq: Once | INTRAVENOUS | Status: AC
  Administered 2023-09-02: 333 mL via INTRAVENOUS

## 2023-09-01 MED ORDER — LIDOCAINE HCL (PF) 1 % IJ SOLN: 30.0000 mL | INTRAMUSCULAR | Status: DC | PRN

## 2023-09-01 MED ORDER — ACETAMINOPHEN-CAFFEINE 500-65 MG PO TABS
2.0000 | ORAL_TABLET | Freq: Once | ORAL | Status: AC
  Administered 2023-09-01: 2 via ORAL
  Filled 2023-09-01: qty 2

## 2023-09-01 MED ORDER — ONDANSETRON HCL 4 MG/2ML IJ SOLN
4.0000 mg | Freq: Four times a day (QID) | INTRAMUSCULAR | Status: DC | PRN
  Administered 2023-09-02: 4 mg via INTRAVENOUS
  Filled 2023-09-01: qty 2

## 2023-09-01 MED ORDER — TERBUTALINE SULFATE 1 MG/ML IJ SOLN
0.2500 mg | Freq: Once | INTRAMUSCULAR | Status: AC | PRN
  Administered 2023-09-02: 0.25 mg via SUBCUTANEOUS
  Filled 2023-09-01: qty 1

## 2023-09-01 MED ORDER — PROMETHAZINE HCL 12.5 MG RE SUPP: 12.5000 mg | Freq: Once | RECTAL | Status: DC

## 2023-09-01 MED ORDER — ACETAMINOPHEN 325 MG PO TABS
650.0000 mg | ORAL_TABLET | ORAL | Status: DC | PRN
  Administered 2023-09-01: 650 mg via ORAL
  Filled 2023-09-01: qty 2

## 2023-09-01 MED ORDER — OXYTOCIN-SODIUM CHLORIDE 30-0.9 UT/500ML-% IV SOLN
1.0000 m[IU]/min | INTRAVENOUS | Status: DC
  Administered 2023-09-01: 2 m[IU]/min via INTRAVENOUS
  Filled 2023-09-01: qty 500

## 2023-09-01 MED ORDER — NALOXONE HCL 0.4 MG/ML IJ SOLN: 0.4000 mg | INTRAMUSCULAR | Status: DC | PRN

## 2023-09-01 MED ORDER — TERBUTALINE SULFATE 1 MG/ML IJ SOLN: 0.2500 mg | Freq: Once | INTRAMUSCULAR | Status: DC | PRN

## 2023-09-01 MED ORDER — SODIUM CHLORIDE 0.9 % IV SOLN
12.5000 mg | Freq: Once | INTRAVENOUS | Status: AC
  Administered 2023-09-01: 12.5 mg via INTRAVENOUS
  Filled 2023-09-01: qty 0.5

## 2023-09-01 MED ORDER — PENICILLIN G POTASSIUM 5000000 UNITS IJ SOLR
5.0000 10*6.[IU] | Freq: Once | INTRAMUSCULAR | Status: AC
  Administered 2023-09-01: 5 10*6.[IU] via INTRAVENOUS
  Filled 2023-09-01: qty 5

## 2023-09-01 MED ORDER — OXYCODONE-ACETAMINOPHEN 5-325 MG PO TABS: 1.0000 | ORAL_TABLET | ORAL | Status: DC | PRN

## 2023-09-01 MED ORDER — FLUOXETINE HCL 20 MG PO CAPS
20.0000 mg | ORAL_CAPSULE | Freq: Every day | ORAL | Status: DC
Start: 2023-09-01 — End: 2023-09-02
  Administered 2023-09-01: 20 mg via ORAL
  Filled 2023-09-01 (×2): qty 1

## 2023-09-01 MED ORDER — NICOTINE 21 MG/24HR TD PT24
21.0000 mg | MEDICATED_PATCH | Freq: Every day | TRANSDERMAL | Status: DC
  Administered 2023-09-01: 21 mg via TRANSDERMAL
  Filled 2023-09-01 (×2): qty 1

## 2023-09-01 MED ORDER — LACTATED RINGERS IV SOLN
INTRAVENOUS | Status: DC
Start: 2023-09-01 — End: 2023-09-02

## 2023-09-01 NOTE — Progress Notes (Signed)
Labor Progress Note Misty Jimenez is a 27 y.o. G1P0000 at [redacted]w[redacted]d presented for IOL S: coping well, requesting nicotine patch and nitrous  O:  BP 128/84 (BP Location: Right Arm)   Pulse 77   Temp 98.4 F (36.9 C) (Oral)   Resp 16   Ht 5\' 7"  (1.702 m)   Wt 93.4 kg   LMP 12/03/2022 (Approximate)   BMI 32.26 kg/m  EFM: 125/moderate variability/accels present/no decels  CVE: Dilation: 3 Effacement (%): 60 Cervical Position: Posterior Station: -2 Presentation: Vertex Exam by:: Franky Macho RN   A&P: 27 y.o. G1P0000 [redacted]w[redacted]d admitted for IOL #Labor: Progressing well.  #Pain: Nitrous gas #FWB: Cat I #GBS positive; PCN  #Tobacco use: 21mg  nicotine patch  Wyn Forster, MD 9:49 PM

## 2023-09-01 NOTE — H&P (Cosign Needed Addendum)
OBSTETRIC ADMISSION HISTORY AND PHYSICAL  Misty Jimenez is a 27 y.o. female G1P0000 with IUP at [redacted]w[redacted]d by LMP presenting for IOL 2/2 FGR and poor BPP. She reports +FMs, No LOF, no VB, no blurry vision, headaches or peripheral edema, and RUQ pain.  She plans on breast feeding. She requests Nexplanon for birth control. She received her prenatal care at  [facility].  Dating: By LMP c/w 18w [place] --->  Estimated Date of Delivery: 09/09/23 Sono:  @[redacted]w[redacted]d , normal anatomy, cephalic presentation, anterior placenta, 2842g, 10% EFW  Was seen in MAU yesterday for fluid leakage, SROM ruled out.  Saw MFM earlier today for BPP and growth [place].  EFW 10%, amniotic fluid decreased.  BPP was 4/8, patient sent to L&D for IOL.  Patient states she is not feeling contractions at this time, was previously having them every 7 minutes this morning.  Has had some intermittent discharge since last night.  Shares she is nervous, but ready for delivery.  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, in remission  Past Medical History: Past Medical History:  Diagnosis Date   Abdominal pain, chronic, right lower quadrant 08/03/2013   Abdominal pain, recurrent    With Headache   Acute pyelonephritis 05/07/2016   kidney surgery at 57 months of age   Acute respiratory failure with hypoxia (HCC) 08/24/2023   ADHD (attention deficit hyperactivity disorder)    Allergy    Anxiety    Bipolar affective disorder, current episode manic with psychotic symptoms (HCC) 02/28/2021   "I was so depressed after losing my job, I was thinking of cutting myself."     Bulimia 04/17/2023   Cannabis hyperemesis syndrome concurrent with and due to cannabis abuse (HCC) 10/12/2020   Closed fracture of distal end of left radius 06/19/2016   Current smoker 10/28/2018   Depression    Endometriosis    Endometritis 07/23/2017   Family history  of adverse reaction to anesthesia    "grandmother gets PONV"   GE reflux 12/16/2011   Hyperlipidemia    Kidney disease 05/07/2016   Left breast abscess 12/18/2021   LGSIL on Pap smear of cervix 04/13/2023   03/2023 LSIL - PAP in 1 year     Moderate cannabis use disorder (HCC) 10/28/2018   Obesity 05/07/2016   PCOS (polycystic ovarian syndrome)    Preventative health care 11/04/2016   Reflux, vesicoureteral    Respiratory illness with fever 12/27/2019   Wrist pain, left 11/04/2016    Past Surgical History: Past Surgical History:  Procedure Laterality Date   KIDNEY SURGERY     As a small child   TYMPANOSTOMY TUBE PLACEMENT     URETER SURGERY     WRIST SURGERY Left 05/2016   10 pins and 2 plates    Obstetrical History: OB History     Gravida  1   Para  0   Term  0   Preterm  0   AB  0   Living  0      SAB  0   IAB  0   Ectopic  0   Multiple  0   Live Births  0           Social History Social History   Socioeconomic History   Marital status: Married    Spouse name: Not on file   Number of children: Not on file   Years of  education: Not on file   Highest education level: Not on file  Occupational History   Not on file  Tobacco Use   Smoking status: Some Days    Current packs/day: 0.00    Average packs/day: 0.3 packs/day for 2.0 years (0.5 ttl pk-yrs)    Types: Cigars, Cigarettes    Start date: 10/27/2013    Last attempt to quit: 10/28/2015    Years since quitting: 7.8   Smokeless tobacco: Former    Types: Chew   Tobacco comments:    Pt declines information  Vaping Use   Vaping status: Former  Substance and Sexual Activity   Alcohol use: Not Currently    Alcohol/week: 2.0 - 5.0 standard drinks of alcohol    Types: 2 - 5 Cans of beer per week    Comment: 3-4 x week   Drug use: Not Currently    Types: Marijuana   Sexual activity: Yes  Other Topics Concern   Not on file  Social History Narrative   Lives in boyfriend, completing High  school   No dietary restrictions has a history of binge eating.. Former smoker, no alcohol or drug use.   Social Determinants of Health   Financial Resource Strain: Medium Risk (11/26/2020)   [facility]   Overall Financial Resource Strain (CARDIA)    Difficulty of Paying Living Expenses: Somewhat hard  Food Insecurity: No Food Insecurity (09/01/2023)   Hunger Vital Sign    Worried About Running Out of Food in the Last Year: Never true    Ran Out of Food in the Last Year: Never true  Transportation Needs: No Transportation Needs (09/01/2023)   PRAPARE - Administrator, Civil Service (Medical): No    Lack of Transportation (Non-Medical): No  Physical Activity: Insufficiently Active (11/26/2020)   [facility]   Exercise Vital Sign    Days of Exercise per Week: 3 days    Minutes of Exercise per Session: 40 min  Stress: Stress Concern Present (11/26/2020)   [facility]   [employer] of Occupational Health - Occupational Stress Questionnaire    Feeling of Stress : Very much  Social Connections: Unknown (03/07/2022)   [facility]   Social Network    Social Network: Not on file    Family History: Family History  Problem Relation Age of Onset   Kidney disease Mother    Alcohol abuse Father        and drug use, heroin, cocaine, marijuana   Migraines Maternal Aunt    Cancer Paternal Aunt        colon in 28s deceased   Asthma Maternal Grandmother    Diabetes Maternal Grandfather    Hyperlipidemia Other    Hypertension Other    Depression Other    Stroke Neg Hx     Allergies: Allergies  Allergen Reactions   Sulfa Antibiotics Hives and Rash   Sulfasalazine Hives   Amphetamines     Reports "different parts of her body melt  and causes hallucinations."   Other     Fiberglass cast caused rash and hives   Monosodium Glutamate Nausea And Vomiting, Rash and Other (See Comments)    Headache and dizziness   Sulfa Drugs Cross Reactors Rash    Medications Prior to Admission  Medication Sig  Dispense Refill Last Dose   aspirin EC 81 MG tablet Take 1 tablet (81 mg total) by mouth daily. Take after 12 weeks for prevention of preeclampsia later in pregnancy 300 tablet 2    cyclobenzaprine (FLEXERIL) 5 MG tablet Take 1 tablet (5 mg total) by mouth 3 (three) times daily as needed for muscle spasms. 20 tablet 0    cyclobenzaprine (FLEXERIL) 5 MG tablet Take 1 tablet (5 mg total) by mouth 3 (three) times daily as needed for muscle spasms. 30 tablet 0    famotidine (PEPCID) 20 MG tablet Take 1 tablet (20 mg total) by mouth 2 (two) times daily. 60 tablet 3    FLUoxetine (PROZAC) 20 MG capsule Take 1 capsule (20 mg total) by mouth daily. 30 capsule 3    ondansetron (ZOFRAN) 8 MG tablet Take 1 tablet (8 mg total) by mouth every 8 (eight) hours as needed for nausea or vomiting. 20 tablet 1    pantoprazole (PROTONIX) 20 MG tablet Take 1 tablet (20 mg total) by mouth daily. 30 tablet 0    prenatal vitamin w/FE, FA (NATACHEW) 29-1 MG CHEW chewable tablet Chew 1 tablet by mouth daily at 12 noon. (Patient not taking: Reported on 06/10/2023)      SUMAtriptan 6 MG/0.5ML SOAJ Inject 6 mg into the skin daily as needed. Inject under the skin at onset of migraine headache. May repeat once in 1 hour if needed. Maximum of 2 injections in 24 hours. 15 mL 6     Review of Systems  All systems reviewed and negative except as stated in HPI.  Blood pressure 126/72, pulse (!) 107, temperature 98.1 F (36.7 C), temperature source Oral, resp. rate 16, height 5\' 7"  (1.702 m), weight 93.4 kg, last menstrual period 12/03/2022. General appearance: alert, cooperative, appears stated age, and no distress Lungs: clear to auscultation bilaterally Heart: regular  rate and rhythm Abdomen: soft, non-tender; bowel sounds normal Pelvic: No LOF, no lesions Extremities: Homans sign is negative, no sign of DVT DTR's 2+  Presentation: cephalic, confirmed this AM by MFM Fetal monitoring: Baseline 125/Moderate variability/15x15 accels/No decels Uterine activity: Irregular  Dilation: 2 Effacement (%): 30 Cervical Position: Posterior Station: -3 Presentation: Vertex Exam by:: Dr. Sharion Dove  Prenatal labs: ABO, Rh: --/--/O POS (11/05 1000) Antibody: NEG (11/05 1000) Rubella: 1.31 (06/12 0940) RPR: Non Reactive (08/14 0842)  HBsAg: Negative (06/12 0940)  HIV: Non Reactive (08/14 0842)  GBS: Positive/-- (10/26 0000)  GTT: Normal Genetic screening: NIPS LR, AFP neg, Horizon neg (Hgb AA) Anatomy US: Normal, FGR on MFM follow up  NURSING  PROVIDER  Office Location [place] Dating by LMP c/w U/S at 18 wks  [facility] Model Traditional Anatomy U/S normal  Initiated care at  [facility]  English               LAB RESULTS   Support Person   Genetics NIPS: LR female AFP: neg      NT/IT (FT only)        Carrier Screen Horizon: neg, Hgb AA  Rhogam   A1C/GTT Early:  Third trimester: normal 2hr  Flu Vaccine        TDaP Vaccine   Blood Type   Covid Vaccine   Antibody       Rubella   Feeding Plan uncertain RPR   Contraception no method HBsAg   Circumcision NA HIV   Pediatrician  HCVAb   Prenatal Classes            Pap      Diagnosis  Date Value Ref Range Status  04/08/2023 - Low grade squamous intraepithelial lesion (LSIL) (A)   Final    BTLConsent  NA GC/CT Initial:  -/- 36wks:    VBAC  Consent  NA GBS For PCN allergy, check sensitivities            DME Rx [ ]  BP cuff [ ]  Weight Scale Waterbirth  [ ]  Class [ ]  Consent [ ]  CNM visit  PHQ9 & GAD7 [  ] new OB [  ] 28 weeks  [  ] 36 weeks Induction  [ ]  Orders Entered [ ] Foley Y/N    Prenatal Transfer Tool  Maternal Diabetes: No Genetic Screening: Normal Maternal  Ultrasounds/Referrals: IUGR Fetal Ultrasounds or other Referrals:  Referred to Materal Fetal Medicine  Maternal Substance Abuse:  No Significant Maternal Medications:  Meds include: Prozac Significant Maternal Lab Results: Group B Strep positive  Results for orders placed or performed during the hospital encounter of 09/01/23 (from the past 24 hour(s))  CBC   Collection Time: 09/01/23  9:26 AM  Result Value Ref Range   WBC 11.7 (H) 4.0 - 10.5 K/uL   RBC 4.25 3.87 - 5.11 MIL/uL   Hemoglobin 13.3 12.0 - 15.0 g/dL   HCT 44.0 10.2 - 72.5 %   MCV 90.8 80.0 - 100.0 fL   MCH 31.3 26.0 - 34.0 pg   MCHC 34.5 30.0 - 36.0 g/dL   RDW 36.6 44.0 - 34.7 %   Platelets 412 (H) 150 - 400 K/uL   nRBC 0.0 0.0 - 0.2 %  Type and screen   Collection Time: 09/01/23 10:00 AM  Result Value Ref Range   ABO/RH(D) O POS    Antibody Screen NEG    Sample Expiration      09/04/2023,2359 Performed at [facility], [address] [place], [place] [address]   Results for orders placed or performed during the hospital encounter of 08/31/23 (from the past 24 hour(s))  Rupture of Membrane (ROM) Plus   Collection Time: 08/31/23  9:41 PM  Result Value Ref Range   Rom Plus NEGATIVE   POCT fern test   Collection Time: 08/31/23  9:48 PM  Result Value Ref Range   POCT Fern Test Negative = intact amniotic membranes     Patient Active Problem List   Diagnosis Date Noted   Encounter for induction of labor 09/01/2023   GBS (group B Streptococcus carrier), +RV culture, currently pregnant 08/24/2023   Fetal growth restriction antepartum 07/22/2023   Supervision of high risk pregnancy, antepartum 04/17/2023   Late prenatal care affecting pregnancy, antepartum 04/17/2023   LGSIL on Pap smear of cervix 04/13/2023   Bipolar II disorder with rapid cycling (HCC) 11/15/2020   Tobacco use 12/28/2019   Posttraumatic stress disorder    Obesity in pregnancy, antepartum 05/07/2016   ADHD (attention deficit  hyperactivity disorder) 08/03/2013    Assessment/Plan:  Misty Jimenez is a 27 y.o. G1P0000 at [redacted]w[redacted]d here for IOL 2/2 FGR and 4/8 BPP.  #Labor: Contractions currently irregular.  Patient will need further induction and is amenable.  S/p one dose of 25 mcg Cytotec PO, on recheck 1.5cm, FB placed and balloon filled to 40cc, pt tolerated procedure well. #Pain: Nitrous, possible epidural #Fetal Well Being: Cat I #ID: GBS positive #Method Of Feeding: breast feeding #Method Of Contraception: Nexplanon #  Circ: N/A  #IUGFR: Was EFW <3% on 9/24 [place], followed with MFM, last MFM [place] with EFW 10% earlier today with BPP of 4/8.  IOL indicated. #GBS positive: PCN PPx. #LGSIL on Pap: Will need PP colposcopy. #MDD, BPD: Continue fluoxetine 20 mg daily. #Substance use disorder: In recovery, no substance use this pregnancy.  Dimitry Sharion Dove, MD [facility] PGY-1 OB Faculty Practice 09/01/23 11:27 AM  Attestation of Supervision of Resident:  I confirm that I have verified the information documented in the resident's note and that I have also personally reperformed the history, physical exam and all medical decision making activities. I placed the FB and filled to 40cc. I have verified that all services and findings are accurately documented in this resident's note; and I agree with management and plan as outlined in the documentation. I have also made any necessary editorial changes.  Sundra Aland, MD OB Fellow, Faculty Practice [facility] [address] [facility], Center for [facility]

## 2023-09-02 ENCOUNTER — Inpatient Hospital Stay (HOSPITAL_COMMUNITY): Payer: 59 | Admitting: Anesthesiology

## 2023-09-02 ENCOUNTER — Encounter (HOSPITAL_COMMUNITY): Payer: Self-pay | Admitting: Family Medicine

## 2023-09-02 ENCOUNTER — Inpatient Hospital Stay (HOSPITAL_COMMUNITY): Payer: 59

## 2023-09-02 DIAGNOSIS — O99344 Other mental disorders complicating childbirth: Secondary | ICD-10-CM

## 2023-09-02 DIAGNOSIS — O99334 Smoking (tobacco) complicating childbirth: Secondary | ICD-10-CM

## 2023-09-02 DIAGNOSIS — O365930 Maternal care for other known or suspected poor fetal growth, third trimester, not applicable or unspecified: Secondary | ICD-10-CM

## 2023-09-02 DIAGNOSIS — O99820 Streptococcus B carrier state complicating pregnancy: Secondary | ICD-10-CM

## 2023-09-02 DIAGNOSIS — O99214 Obesity complicating childbirth: Secondary | ICD-10-CM

## 2023-09-02 DIAGNOSIS — Z3A38 38 weeks gestation of pregnancy: Secondary | ICD-10-CM

## 2023-09-02 MED ORDER — LACTATED RINGERS IV SOLN
500.0000 mL | Freq: Once | INTRAVENOUS | Status: AC
  Administered 2023-09-02: 500 mL via INTRAVENOUS

## 2023-09-02 MED ORDER — ZOLPIDEM TARTRATE 5 MG PO TABS: 5.0000 mg | ORAL_TABLET | Freq: Every evening | ORAL | Status: DC | PRN

## 2023-09-02 MED ORDER — EPHEDRINE 5 MG/ML INJ: 10.0000 mg | INTRAVENOUS | Status: DC | PRN

## 2023-09-02 MED ORDER — DIPHENHYDRAMINE HCL 25 MG PO CAPS: 25.0000 mg | ORAL_CAPSULE | Freq: Four times a day (QID) | ORAL | Status: DC | PRN

## 2023-09-02 MED ORDER — PRENATAL MULTIVITAMIN CH
1.0000 | ORAL_TABLET | Freq: Every day | ORAL | Status: DC
  Administered 2023-09-03 – 2023-09-04 (×2): 1 via ORAL
  Filled 2023-09-02 (×3): qty 1

## 2023-09-02 MED ORDER — BUPIVACAINE HCL (PF) 0.25 % IJ SOLN
INTRAMUSCULAR | Status: DC | PRN
  Administered 2023-09-02 (×2): 5 mL via EPIDURAL

## 2023-09-02 MED ORDER — ONDANSETRON HCL 4 MG/2ML IJ SOLN: 4.0000 mg | INTRAMUSCULAR | Status: DC | PRN

## 2023-09-02 MED ORDER — SENNOSIDES-DOCUSATE SODIUM 8.6-50 MG PO TABS
2.0000 | ORAL_TABLET | ORAL | Status: DC
  Administered 2023-09-02 – 2023-09-04 (×3): 2 via ORAL
  Filled 2023-09-02 (×3): qty 2

## 2023-09-02 MED ORDER — ACETAMINOPHEN 325 MG PO TABS
650.0000 mg | ORAL_TABLET | ORAL | Status: DC | PRN
  Administered 2023-09-02 – 2023-09-04 (×5): 650 mg via ORAL
  Filled 2023-09-02 (×5): qty 2

## 2023-09-02 MED ORDER — FENTANYL CITRATE (PF) 100 MCG/2ML IJ SOLN
INTRAMUSCULAR | Status: DC | PRN
  Administered 2023-09-02: 100 ug via EPIDURAL

## 2023-09-02 MED ORDER — DIBUCAINE (PERIANAL) 1 % EX OINT: 1.0000 | TOPICAL_OINTMENT | CUTANEOUS | Status: DC | PRN

## 2023-09-02 MED ORDER — LIDOCAINE HCL (PF) 1 % IJ SOLN
INTRAMUSCULAR | Status: DC | PRN
  Administered 2023-09-02 (×2): 4 mL via EPIDURAL

## 2023-09-02 MED ORDER — WITCH HAZEL-GLYCERIN EX PADS: 1.0000 | MEDICATED_PAD | CUTANEOUS | Status: DC | PRN

## 2023-09-02 MED ORDER — DIPHENHYDRAMINE HCL 50 MG/ML IJ SOLN
12.5000 mg | INTRAMUSCULAR | Status: DC | PRN
  Administered 2023-09-02 (×2): 12.5 mg via INTRAVENOUS
  Filled 2023-09-02: qty 1

## 2023-09-02 MED ORDER — ONDANSETRON HCL 4 MG PO TABS
4.0000 mg | ORAL_TABLET | ORAL | Status: DC | PRN
  Administered 2023-09-04: 4 mg via ORAL
  Filled 2023-09-02: qty 1

## 2023-09-02 MED ORDER — PHENYLEPHRINE 80 MCG/ML (10ML) SYRINGE FOR IV PUSH (FOR BLOOD PRESSURE SUPPORT)
80.0000 ug | PREFILLED_SYRINGE | INTRAVENOUS | Status: DC | PRN
Start: 2023-09-02 — End: 2023-09-02

## 2023-09-02 MED ORDER — SIMETHICONE 80 MG PO CHEW: 80.0000 mg | CHEWABLE_TABLET | ORAL | Status: DC | PRN

## 2023-09-02 MED ORDER — FENTANYL-BUPIVACAINE-NACL 0.5-0.125-0.9 MG/250ML-% EP SOLN
12.0000 mL/h | EPIDURAL | Status: DC | PRN
  Administered 2023-09-02: 12 mL/h via EPIDURAL
  Filled 2023-09-02: qty 250

## 2023-09-02 MED ORDER — FENTANYL CITRATE (PF) 100 MCG/2ML IJ SOLN
INTRAMUSCULAR | Status: AC
  Filled 2023-09-02: qty 2

## 2023-09-02 MED ORDER — COCONUT OIL OIL: 1.0000 | TOPICAL_OIL | Status: DC | PRN

## 2023-09-02 MED ORDER — BENZOCAINE-MENTHOL 20-0.5 % EX AERO
1.0000 | INHALATION_SPRAY | CUTANEOUS | Status: DC | PRN
  Administered 2023-09-02: 1 via TOPICAL
  Filled 2023-09-02: qty 56

## 2023-09-02 MED ORDER — TETANUS-DIPHTH-ACELL PERTUSSIS 5-2.5-18.5 LF-MCG/0.5 IM SUSY: 0.5000 mL | PREFILLED_SYRINGE | Freq: Once | INTRAMUSCULAR | Status: DC

## 2023-09-02 MED ORDER — IBUPROFEN 600 MG PO TABS
600.0000 mg | ORAL_TABLET | Freq: Four times a day (QID) | ORAL | Status: DC
  Administered 2023-09-02 – 2023-09-04 (×8): 600 mg via ORAL
  Filled 2023-09-02 (×8): qty 1

## 2023-09-02 NOTE — Plan of Care (Signed)
Problem: Education: Goal: Knowledge of General Education information will improve Description: Including pain rating scale, medication(s)/side effects and non-pharmacologic comfort measures Outcome: Progressing   Problem: Health Behavior/Discharge Planning: Goal: Ability to manage health-related needs will improve Outcome: Progressing   Problem: Clinical Measurements: Goal: Ability to maintain clinical measurements within normal limits will improve Outcome: Progressing Goal: Will remain free from infection Outcome: Progressing Goal: Diagnostic test results will improve Outcome: Progressing Goal: Respiratory complications will improve Outcome: Progressing Goal: Cardiovascular complication will be avoided Outcome: Progressing   Problem: Activity: Goal: Risk for activity intolerance will decrease Outcome: Progressing   Problem: Nutrition: Goal: Adequate nutrition will be maintained Outcome: Progressing   Problem: Coping: Goal: Level of anxiety will decrease Outcome: Progressing   Problem: Elimination: Goal: Will not experience complications related to bowel motility Outcome: Progressing Goal: Will not experience complications related to urinary retention Outcome: Progressing   Problem: Pain Management: Goal: General experience of comfort will improve Outcome: Progressing   Problem: Safety: Goal: Ability to remain free from injury will improve Outcome: Progressing   Problem: Skin Integrity: Goal: Risk for impaired skin integrity will decrease Outcome: Progressing   Problem: Education: Goal: Knowledge of Childbirth will improve Outcome: Progressing Goal: Ability to make informed decisions regarding treatment and plan of care will improve Outcome: Progressing Goal: Ability to state and carry out methods to decrease the pain will improve Outcome: Progressing Goal: Individualized Educational Video(s) Outcome: Progressing   Problem: Coping: Goal: Ability to  verbalize concerns and feelings about labor and delivery will improve Outcome: Progressing   Problem: Life Cycle: Goal: Ability to make normal progression through stages of labor will improve Outcome: Progressing Goal: Ability to effectively push during vaginal delivery will improve Outcome: Progressing   Problem: Role Relationship: Goal: Will demonstrate positive interactions with the child Outcome: Progressing   Problem: Safety: Goal: Risk of complications during labor and delivery will decrease Outcome: Progressing   Problem: Pain Management: Goal: Relief or control of pain from uterine contractions will improve Outcome: Progressing   Problem: Education: Goal: Knowledge of General Education information will improve Description: Including pain rating scale, medication(s)/side effects and non-pharmacologic comfort measures Outcome: Progressing   Problem: Health Behavior/Discharge Planning: Goal: Ability to manage health-related needs will improve Outcome: Progressing   Problem: Clinical Measurements: Goal: Ability to maintain clinical measurements within normal limits will improve Outcome: Progressing Goal: Will remain free from infection Outcome: Progressing Goal: Diagnostic test results will improve Outcome: Progressing Goal: Respiratory complications will improve Outcome: Progressing Goal: Cardiovascular complication will be avoided Outcome: Progressing   Problem: Activity: Goal: Risk for activity intolerance will decrease Outcome: Progressing   Problem: Nutrition: Goal: Adequate nutrition will be maintained Outcome: Progressing   Problem: Coping: Goal: Level of anxiety will decrease Outcome: Progressing   Problem: Elimination: Goal: Will not experience complications related to bowel motility Outcome: Progressing Goal: Will not experience complications related to urinary retention Outcome: Progressing   Problem: Pain Management: Goal: General  experience of comfort will improve Outcome: Progressing   Problem: Safety: Goal: Ability to remain free from injury will improve Outcome: Progressing   Problem: Skin Integrity: Goal: Risk for impaired skin integrity will decrease Outcome: Progressing   Problem: Education: Goal: Knowledge of Childbirth will improve Outcome: Progressing Goal: Ability to make informed decisions regarding treatment and plan of care will improve Outcome: Progressing Goal: Ability to state and carry out methods to decrease the pain will improve Outcome: Progressing Goal: Individualized Educational Video(s) Outcome: Progressing   Problem: Coping: Goal: Ability to  verbalize concerns and feelings about labor and delivery will improve Outcome: Progressing   Problem: Life Cycle: Goal: Ability to make normal progression through stages of labor will improve Outcome: Progressing Goal: Ability to effectively push during vaginal delivery will improve Outcome: Progressing

## 2023-09-02 NOTE — Discharge Summary (Cosign Needed Addendum)
Postpartum Discharge Summary    Patient Name: [place] DOB: [birth year] MRN: [id number]  Date of admission: 09/01/2023 Delivery date:09/02/2023 Delivering provider: Celedonio Savage Date of discharge: 09/04/2023  Admitting diagnosis: Encounter for induction of labor [Z34.90] Intrauterine pregnancy: [redacted]w[redacted]d     Secondary diagnosis:  Principal Problem:   Encounter for induction of labor Active Problems:   Obesity in pregnancy, antepartum   Tobacco use   Supervision of high risk pregnancy, antepartum   Fetal growth restriction antepartum   ADHD (attention deficit hyperactivity disorder)   Bipolar II disorder with rapid cycling (HCC)   GBS (group B Streptococcus carrier), +RV culture, currently pregnant   Vaginal delivery  Additional problems: None    Discharge diagnosis: Term Pregnancy Delivered                                              Post partum procedures: Nexplanon Augmentation: AROM, Pitocin, and Cytotec Complications: None  Hospital course: Induction of Labor With Vaginal Delivery   27 y.o. yo G1P1001 at [redacted]w[redacted]d was admitted to the hospital 09/01/2023 for induction of labor.  Indication for induction:  IUGR .  Patient had an uncomplicated labor course Membrane Rupture Time/Date: 11:28 PM,09/01/2023  Delivery Method:Vaginal, Spontaneous Operative Delivery:N/A Episiotomy: None Lacerations:  Labial Details of delivery can be found in separate delivery note.  Patient had a postpartum course complicated by none. Patient is discharged home 09/04/23.  Newborn Data: Birth date:09/02/2023 Birth time:8:43 AM Gender:Female Living status:Living Apgars:8 ,9  Weight:2466 g  Magnesium Sulfate received: No BMZ received: No Rhophylac:N/A MMR:N/A T-DaP:Given prenatally Flu: No RSV Vaccine received: No Transfusion:No  Immunizations received: Immunization History  Administered Date(s) Administered   DTaP 07/04/1998, 08/29/1998, 11/14/1998, 10/30/1999, 05/10/2002   H1N1  09/07/2008   HIB (PRP-OMP) 07/04/1998, 08/29/1998, 10/30/1999   HPV Quadrivalent 05/30/2009, 06/03/2010, 06/11/2011   Hepatitis A 05/12/2006, 05/14/2007   Hepatitis B 11/14/1998, 02/06/1999, 05/10/1999   IPV 07/04/1998, 08/29/1998, 10/30/1999, 05/10/2002   Influenza Nasal 09/07/2008, 05/30/2009, 06/03/2010   Influenza Split 09/18/2006   Influenza, Seasonal, Injecte, Preservative Fre 09/04/2023   Influenza,inj,Quad PF,6+ Mos 11/04/2016, 08/17/2018, 11/11/2018, 09/08/2019   MMR 10/30/1999, 05/10/2002   Meningococcal Conjugate 05/30/2009, 07/12/2014   Pneumococcal Conjugate-13 01/01/2000, 03/24/2000   Td 05/30/2009   Tdap 05/30/2009, 06/10/2023   Varicella 05/10/1999, 05/12/2006    Physical exam  Vitals:   09/03/23 0500 09/03/23 1248 09/03/23 2130 09/04/23 0519  BP: (!) 93/40 133/75 112/72 115/75  Pulse: 62 74 60 83  Resp: 18 20 18 18   Temp: 98.1 F (36.7 C) 98.1 F (36.7 C) 97.8 F (36.6 C) 98.4 F (36.9 C)  TempSrc: Oral Oral Oral Oral  SpO2: 97%  98% 100%  Weight:      Height:       General: alert, cooperative, and no distress Lochia: appropriate Uterine Fundus: firm Incision: N/A DVT Evaluation: No evidence of DVT seen on physical exam. No significant calf/ankle edema. Labs: Lab Results  Component Value Date   WBC 11.7 (H) 09/01/2023   HGB 13.3 09/01/2023   HCT 38.6 09/01/2023   MCV 90.8 09/01/2023   PLT 412 (H) 09/01/2023      Latest Ref Rng & Units 08/28/2023    9:00 AM  CMP  Glucose 70 - 99 mg/dL 83   BUN 6 - 20 mg/dL 6   Creatinine 4.09 - 8.11 mg/dL 9.14  Sodium 135 - 145 mmol/L 134   Potassium 3.5 - 5.1 mmol/L 3.7   Chloride 98 - 111 mmol/L 105   CO2 22 - 32 mmol/L 22   Calcium 8.9 - 10.3 mg/dL 8.7   Total Protein 6.5 - 8.1 g/dL 5.6   Total Bilirubin 0.3 - 1.2 mg/dL 0.5   Alkaline Phos 38 - 126 U/L 194   AST 15 - 41 U/L 49   ALT 0 - 44 U/L 37    Edinburgh Score:    09/02/2023   12:15 PM  Edinburgh Postnatal Depression Scale Screening Tool   I have been able to laugh and see the funny side of things. 0  I have looked forward with enjoyment to things. 0  I have blamed myself unnecessarily when things went wrong. 2  I have been anxious or worried for no good reason. 2  I have felt scared or panicky for no good reason. 2  Things have been getting on top of me. 2  I have been so unhappy that I have had difficulty sleeping. 2  I have felt sad or miserable. 0  I have been so unhappy that I have been crying. 1  The thought of harming myself has occurred to me. 0  Edinburgh Postnatal Depression Scale Total 11   No data recorded  After visit meds:  Allergies as of 09/04/2023       Reactions   Sulfa Antibiotics Hives, Rash   Sulfasalazine Hives   Amphetamines    Reports "different parts of her body melt and causes hallucinations."   Other    Fiberglass cast caused rash and hives   Monosodium Glutamate Nausea And Vomiting, Rash, Other (See Comments)   Headache and dizziness   Sulfa Drugs Cross Reactors Rash        Medication List     STOP taking these medications    aspirin EC 81 MG tablet       TAKE these medications    acetaminophen 325 MG tablet Commonly known as: Tylenol Take 2 tablets (650 mg total) by mouth every 4 (four) hours as needed (for pain scale < 4).   coconut oil Oil Apply 1 Application topically as needed.   cyclobenzaprine 5 MG tablet Commonly known as: FLEXERIL Take 1 tablet (5 mg total) by mouth 3 (three) times daily as needed for muscle spasms. What changed: Another medication with the same name was removed. Continue taking this medication, and follow the directions you see here.   famotidine 20 MG tablet Commonly known as: PEPCID Take 1 tablet (20 mg total) by mouth 2 (two) times daily.   FLUoxetine 20 MG capsule Commonly known as: PROzac Take 1 capsule (20 mg total) by mouth daily.   ibuprofen 600 MG tablet Commonly known as: ADVIL Take 1 tablet (600 mg total) by mouth every 6  (six) hours.   ondansetron 8 MG tablet Commonly known as: Zofran Take 1 tablet (8 mg total) by mouth every 8 (eight) hours as needed for nausea or vomiting.   pantoprazole 20 MG tablet Commonly known as: Protonix Take 1 tablet (20 mg total) by mouth daily.   prenatal vitamin w/FE, FA 29-1 MG Chew chewable tablet Chew 1 tablet by mouth daily at 12 noon.   senna-docusate 8.6-50 MG tablet Commonly known as: Senokot-S Take 2 tablets by mouth daily.   simethicone 80 MG chewable tablet Commonly known as: MYLICON Chew 1 tablet (80 mg total) by mouth as needed for flatulence.  SUMAtriptan 6 MG/0.5ML Soaj Inject 6 mg into the skin daily as needed. Inject under the skin at onset of migraine headache. May repeat once in 1 hour if needed. Maximum of 2 injections in 24 hours.   witch hazel-glycerin pad Commonly known as: TUCKS Apply 1 Application topically as needed for hemorrhoids.         Discharge home in stable condition Infant Feeding: Bottle Infant Disposition:home with mother Discharge instruction: per After Visit Summary and Postpartum booklet. Activity: Advance as tolerated. Pelvic rest for 6 weeks.  Diet: routine diet Future Appointments: Future Appointments  Date Time Provider Department Center  10/07/2023  9:15 AM Levie Heritage, DO [facility] None   Follow up Visit: Message sent   Please schedule this patient for a In person postpartum visit in 4 weeks with the following provider: Any provider. Additional Postpartum F/U:[place]  High risk pregnancy complicated by:  IUGR Delivery mode:  Vaginal, Spontaneous Anticipated Birth Control:  Nexplanon inserted prior to discharge    09/04/2023 Richardson Landry, CNM

## 2023-09-02 NOTE — Lactation Note (Signed)
This note was copied from a baby's chart. Lactation Consultation Note  Patient Name: Misty Jimenez Today's Date: 09/02/2023 Age:27 years Reason for consult: Follow-up assessment;1st time breastfeeding;Infant < 6lbs  LC returned to mom- @ agreed time. Handout on late pre-term infant feeding plan shared with mom R/T infant weight less then 2500 grams. Bedside RN had expressed colostrum again with mom and given glucose gel as well- R/T baby blood sugar- Pump set up with fitted phalanges- 21 mm-. Cycle completed on pump with mom- 4 ml collected and fed by syringe to baby. Donor milk consent at bedside. Repeat BS anticipated to assist with feeding plan. Baby returned StoS with Dad, mom had deferred to Dad so she could eat. Encouraged calling for [facility] as needed.   Maternal Data Has patient been taught Hand Expression?: Yes Does the patient have breastfeeding experience prior to this delivery?: No  Feeding Mother's Current Feeding Choice: Breast Milk   Lactation Tools Discussed/Used Tools: Pump;Flanges;18F feeding tube / Syringe Flange Size: 21 Breast pump type: Double-Electric Breast Pump Pump Education: Setup, frequency, and cleaning Reason for Pumping: Breast stimulation/ colostrum collection R/T infant blood sugars, small size of baby, and weak effort at breast Pumped volume: 4 mL  Interventions Interventions: Skin to skin;Adjust position;Support pillows;Expressed milk;DEBP;Education  Discharge Pump: DEBP  Consult Status Consult Status: Follow-up Date: 09/02/23 Follow-up type: In-patient    [facility] 09/02/2023, 2:48 PM

## 2023-09-02 NOTE — Anesthesia Preprocedure Evaluation (Signed)
Anesthesia Evaluation  Patient identified by MRN, date of birth, ID band Patient awake    Reviewed: Allergy & Precautions, NPO status , Patient's Chart, lab work & pertinent test results  History of Anesthesia Complications (+) Family history of anesthesia reaction and history of anesthetic complications (family PONV)  Airway Mallampati: III  TM Distance: >3 FB Neck ROM: Full    Dental   Pulmonary Current Smoker   Pulmonary exam normal breath sounds clear to auscultation       Cardiovascular negative cardio ROS  Rhythm:Regular Rate:Normal     Neuro/Psych  Headaches PSYCHIATRIC DISORDERS (h/o bulimia, ADHD, PTSD) Anxiety Depression Bipolar Disorder      GI/Hepatic ,GERD  Medicated,,(+)     substance abuse  marijuana use  Endo/Other  negative endocrine ROS    Renal/GU Renal disease     Musculoskeletal   Abdominal   Peds  Hematology negative hematology ROS (+) Lab Results      Component                Value               Date                      WBC                      11.7 (H)            09/01/2023                HGB                      13.3                09/01/2023                HCT                      38.6                09/01/2023                MCV                      90.8                09/01/2023                PLT                      412 (H)             09/01/2023              Anesthesia Other Findings   Reproductive/Obstetrics (+) Pregnancy endometriosis                             Anesthesia Physical Anesthesia Plan  ASA: 2  Anesthesia Plan: Epidural   Post-op Pain Management:    Induction:   PONV Risk Score and Plan:   Airway Management Planned:   Additional Equipment:   Intra-op Plan:   Post-operative Plan:   Informed Consent: I have reviewed the patients History and Physical, chart, labs and discussed the procedure including the risks, benefits and  alternatives for the proposed anesthesia with the patient or authorized representative who has indicated  his/her understanding and acceptance.       Plan Discussed with: Anesthesiologist  Anesthesia Plan Comments: (I have discussed risks of neuraxial anesthesia including but not limited to infection, bleeding, nerve injury, back pain, headache, seizures, and failure of block. Patient denies bleeding disorders and is not currently anticoagulated. Labs have been reviewed. Risks and benefits discussed. All patient's questions answered.  )       Anesthesia Quick Evaluation

## 2023-09-02 NOTE — Anesthesia Procedure Notes (Signed)
Epidural Patient location during procedure: OB Start time: 09/02/2023 1:47 AM End time: 09/02/2023 1:51 AM  Staffing Anesthesiologist: Linton Rump, MD Performed: anesthesiologist   Preanesthetic Checklist Completed: patient identified, IV checked, site marked, risks and benefits discussed, surgical consent, monitors and equipment checked, pre-op evaluation and timeout performed  Epidural Patient position: sitting Prep: DuraPrep and site prepped and draped Patient monitoring: continuous pulse ox and blood pressure Approach: midline Location: L3-L4 Injection technique: LOR saline  Needle:  Needle type: Tuohy  Needle gauge: 17 G Needle length: 9 cm and 9 Needle insertion depth: 5.5 cm Catheter type: closed end flexible Catheter size: 19 Gauge Catheter at skin depth: 10 cm Test dose: negative  Assessment Events: blood not aspirated, no cerebrospinal fluid, injection not painful, no injection resistance, no paresthesia and negative IV test  Additional Notes The patient has requested an epidural for labor pain management. Risks and benefits including, but not limited to, infection, bleeding, local anesthetic toxicity, headache, hypotension, back pain, block failure, etc. were discussed with the patient. The patient expressed understanding and consented to the procedure. I confirmed that the patient has no bleeding disorders and is not taking blood thinners. I confirmed the patient's last platelet count with the nurse. A time-out was performed immediately prior to the procedure. Please see nursing documentation for vital signs. Sterile technique was used throughout the whole procedure. Once LOR achieved, the epidural catheter threaded easily without resistance. Aspiration of the catheter was negative for blood and CSF. The epidural was dosed slowly and an infusion was started.  1 attempt(s)Reason for block:procedure for pain

## 2023-09-02 NOTE — Progress Notes (Signed)
Called to the room for fetal decel and difficulty monitoring baby and contractions  Patient 20 minutes after epidural placement  Vitals:   09/02/23 0330 09/02/23 0335  BP: (!) 109/46 (!) 114/96  Pulse: 98 (!) 162  Resp:    Temp:    SpO2: 98% 99%   Very uncomfortable still with contractions, involuntarily pushing.   Discussed risk and benefits of FSE, IUPC. Hesitant but agreeable with continued fetal decels and inability to determine relation to contractions.   FSE and IUPC placed.  Fluid bolus administered. Hands and knees assumed due to prolonged fetal decel to the 70s Pitocin stopped and terbutaline administered.  Due to prolonged decel to 4 minutes discussed indications for emergency c-section, risk and benefits of the procedures. Pt agreed if emergency necessary.   FHR recovered to baseline without further intervention.  Pt repositioned to left lateral, with continued Cat I fetal tracing.   Anesthesia present for requested bolus.  Will give baby another 20-30 minutes to recover prior to any anesthesia boluses.  Pt more comfortable with terbutaline, and pitocin stopped.   Will restart pitocin at 2 in 30 minutes, and retitrate to adequate by 2 every 30 minutes per protocol.   Wyn Forster, MD FMOB Fellow, Faculty practice [facility], Center for [facility]

## 2023-09-02 NOTE — Lactation Note (Signed)
This note was copied from a baby's chart. Lactation Consultation Note  Patient Name: Misty Jimenez Today's Date: 09/02/2023 Age:25 hours Reason for consult: Follow-up assessment;Primapara;1st time breastfeeding;Term;Infant < 6lbs;Maternal endocrine disorder  P1- Misty Jimenez reported that infant had nursed for 10 minutes on and off before LC entered room. Infant was not currently latched to the breast, so LC did not see this breastfeeding. LC discussed the LPI/low birth weight feeding plan set in place for infant. LC reviewed the importance of not allowing infant to go over 30 minutes for one feeding to reduce calorie burn. LC encouraged Misty Jimenez to place infant to the breast, but no longer than 15 minutes so we can supplement with EBM and 22 kcal formula. Misty Jimenez stated that she has been only giving infant EBM and no formula.   LC reviewed the importance of supplementing with a minimum of 12 mL on day 1. Misty Jimenez agreed to [facility] offering formula as supplementation after Misty Jimenez's EBM. LC first bottle fed the 2 mL of EBM to infant, then offered 15 mL of formula. Out of the 15 mL, infant drank 9 mL in 15 minutes. LC had to support infant's chin in order for infant to eat. LC noted tongue thrusting and a lack of coordination. LC informed RN of how infant fed.  While LC reviewed how to pace feed infant with FOB, Misty Jimenez started having a panic attack in the bathroom (per Misty Jimenez's mom). LC informed RN of how Misty Jimenez was feeling. LC praised Misty Jimenez for all of her hard work and told her that she is doing a Firefighter job. LC encouraged Misty Jimenez to call lactation team for further support as needed.  Maternal Data Has patient been taught Hand Expression?: Yes Does the patient have breastfeeding experience prior to this delivery?: No  Feeding Mother's Current Feeding Choice: Breast Milk and Formula Nipple Type: Nfant Standard Flow (white)  Lactation Tools Discussed/Used Tools: Pump;Flanges Flange Size: 21 Breast pump type: Double-Electric Breast  Pump;Manual Pump Education: Setup, frequency, and cleaning;Milk Storage Reason for Pumping: LPI infant Pumping frequency: 15-20 min every 2-3 hrs Pumped volume: 2 mL  Interventions Interventions: Breast feeding basics reviewed;DEBP;Education;Pace feeding;LC Services brochure;LPT handout/interventions  Discharge Discharge Education: Warning signs for feeding baby  Consult Status Consult Status: Follow-up Date: 09/03/23 Follow-up type: In-patient    Dema Severin BS, IBCLC 09/02/2023, 11:09 PM

## 2023-09-02 NOTE — Lactation Note (Signed)
This note was copied from a baby's chart. Lactation Consultation Note  Patient Name: Misty Jimenez Today's Date: 09/02/2023 Age:25 hours Reason for consult: Initial assessment;Infant < 6lbs;1st time breastfeeding  P1, 39 Wks, less than 2500 grams. Infant asleep on mom- StoS. Mom shares concerns, baby has not latched yet. Discussed awake window, now sleepy. Discussed small belly size and initial energy, baby needing more hands on reminders to learn to work at breast. Demonstrated positioning/steps of latching. Hand expression- Colostrum visualized easily. Infant will accept breast, attempted a couple suckles. 4 ml of colostrum easily collected and fed to baby off spoon. Encouraged mom that in 2 hours we will try again, that baby will need a little more encouragement as she learns latching. Mom receptive to [facility] returning in a couple hours to ensure baby receiving good nutrition.  Maternal Data Has patient been taught Hand Expression?: Yes Does the patient have breastfeeding experience prior to this delivery?: No  Feeding Mother's Current Feeding Choice: Breast Milk   Interventions Interventions: Breast feeding basics reviewed;Hand express;Education  Discharge    Consult Status Consult Status: Follow-up Date: 09/02/23 Follow-up type: In-patient    [facility] 09/02/2023, 12:21 PM

## 2023-09-03 LAB — BIRTH TISSUE RECOVERY COLLECTION (PLACENTA DONATION)

## 2023-09-03 MED ORDER — INFLUENZA VIRUS VACC SPLIT PF (FLUZONE) 0.5 ML IM SUSY
0.5000 mL | PREFILLED_SYRINGE | INTRAMUSCULAR | Status: AC
  Administered 2023-09-04: 0.5 mL via INTRAMUSCULAR
  Filled 2023-09-03: qty 0.5

## 2023-09-03 MED ORDER — FLUOXETINE HCL 20 MG PO CAPS
20.0000 mg | ORAL_CAPSULE | Freq: Every day | ORAL | Status: DC
  Administered 2023-09-03 – 2023-09-04 (×2): 20 mg via ORAL
  Filled 2023-09-03 (×2): qty 1

## 2023-09-03 NOTE — Progress Notes (Signed)
POSTPARTUM PROGRESS NOTE  Post Partum Day 1  Subjective:  Misty Jimenez is a 27 y.o. G1P1001 s/p SVD at [redacted]w[redacted]d.  She reports she is doing well. No acute events overnight. She denies any problems with ambulating, voiding or po intake. Denies nausea or vomiting.  Pain is well controlled.  Lochia is = menses.  Objective: Blood pressure 133/75, pulse 74, temperature 98.1 F (36.7 C), temperature source Oral, resp. rate 20, height 5\' 7"  (1.702 m), weight 93.4 kg, last menstrual period 12/03/2022, SpO2 97%, unknown if currently breastfeeding.  Physical Exam:  General: alert, cooperative and no distress Chest: no respiratory distress Heart:regular rate, distal pulses intact Uterine Fundus: firm, appropriately tender DVT Evaluation: No calf swelling or tenderness Extremities: trace edema Skin: warm, dry  Recent Labs    09/01/23 0926  HGB 13.3  HCT 38.6    Assessment/Plan: New Misty Jimenez is a 27 y.o. G1P1001 s/p SVD at [redacted]w[redacted]d   PPD#1 - Doing well  Routine postpartum care Contraception: Nexplanon Feeding: breast Dispo: Plan for discharge 09/04/23.   LOS: 2 days   Hessie Dibble, MD OB Fellow  09/03/2023, 4:21 PM

## 2023-09-03 NOTE — Anesthesia Postprocedure Evaluation (Signed)
Anesthesia Post Note  Patient: Misty Jimenez  Procedure(s) Performed: AN AD HOC LABOR EPIDURAL     Patient location during evaluation: Mother Baby Anesthesia Type: Epidural Level of consciousness: awake, oriented and awake and alert Pain management: pain level controlled Vital Signs Assessment: post-procedure vital signs reviewed and stable Respiratory status: spontaneous breathing, respiratory function stable and nonlabored ventilation Cardiovascular status: stable Postop Assessment: no headache, adequate PO intake, able to ambulate, patient able to bend at knees and no apparent nausea or vomiting Anesthetic complications: no   No notable events documented.  Last Vitals:  Vitals:   09/03/23 0144 09/03/23 0500  BP: 122/72 (!) 93/40  Pulse: 62 62  Resp: 17 18  Temp: 36.6 C 36.7 C  SpO2: 95% 97%    Last Pain:  Vitals:   09/03/23 0720  TempSrc:   PainSc: 0-No pain   Pain Goal:                   Ivie Maese

## 2023-09-03 NOTE — Progress Notes (Signed)
Another RN heard yelling coming from patient room . Upon entering room other RN was fixing patient breast pump and patient tearful. This RN asked what happened .Patient states she got upset  because breast pump was not working. Patient also states she has not slept in 48 hours and feels very emotional. Patient states she scratched her lip because she was upset about the breast pump not working .This  RN held pressure to lip do to it bleeding . This RN also was able to provide emotional support to patient and father of infant and deescalate situation. Patient also states that she had not taken Prozac since 11/05. RN called provider and got order placed for patient Prozac.

## 2023-09-03 NOTE — Social Work (Signed)
CSW acknowledged consult and attempted to meet with MOB. However, MOB was resting and requested CSW come back later.  CSW will meet with MOB at a later time.  Wende Neighbors, LCSWA Clinical Social Worker [phone]

## 2023-09-04 DIAGNOSIS — Z30017 Encounter for initial prescription of implantable subdermal contraceptive: Secondary | ICD-10-CM | POA: Diagnosis not present

## 2023-09-04 MED ORDER — ACETAMINOPHEN 325 MG PO TABS: 650.0000 mg | ORAL_TABLET | ORAL | Status: DC | PRN

## 2023-09-04 MED ORDER — IBUPROFEN 600 MG PO TABS: 600.0000 mg | ORAL_TABLET | Freq: Four times a day (QID) | ORAL | 0 refills | Status: DC

## 2023-09-04 MED ORDER — ETONOGESTREL 68 MG ~~LOC~~ IMPL: 68.0000 mg | DRUG_IMPLANT | Freq: Once | SUBCUTANEOUS | Status: DC

## 2023-09-04 MED ORDER — COCONUT OIL OIL: 1.0000 | TOPICAL_OIL | Status: DC | PRN

## 2023-09-04 MED ORDER — ETONOGESTREL 68 MG ~~LOC~~ IMPL
68.0000 mg | DRUG_IMPLANT | Freq: Once | SUBCUTANEOUS | Status: AC
  Administered 2023-09-04: 68 mg via SUBCUTANEOUS
  Filled 2023-09-04: qty 1

## 2023-09-04 MED ORDER — LIDOCAINE HCL 1 % IJ SOLN: 0.0000 mL | Freq: Once | INTRAMUSCULAR | Status: DC | PRN

## 2023-09-04 MED ORDER — SIMETHICONE 80 MG PO CHEW: 80.0000 mg | CHEWABLE_TABLET | ORAL | Status: DC | PRN

## 2023-09-04 MED ORDER — SENNOSIDES-DOCUSATE SODIUM 8.6-50 MG PO TABS: 2.0000 | ORAL_TABLET | ORAL | Status: DC

## 2023-09-04 MED ORDER — WITCH HAZEL-GLYCERIN EX PADS: 1.0000 | MEDICATED_PAD | CUTANEOUS | 12 refills | Status: DC | PRN

## 2023-09-04 MED ORDER — LIDOCAINE HCL 1 % IJ SOLN
0.0000 mL | Freq: Once | INTRAMUSCULAR | Status: AC | PRN
  Administered 2023-09-04: 20 mL via INTRADERMAL
  Filled 2023-09-04: qty 20

## 2023-09-04 NOTE — Lactation Note (Signed)
This note was copied from a baby's chart. Lactation Consultation Note  Patient Name: Misty Jimenez Today's Date: 09/04/2023 Age:25 hours Reason for consult: Follow-up assessment;1st time breastfeeding;Infant < 6lbs  P1, Dala Dock, SLP working with infant evaluating bottle feeding when [facility] entered room.  Mother recently pumped 7 ml.  After feeding with SLP  (7 ml breastmilk, 15 ml of formula) brief attempt at the breast was made. Baby was fatigued.  Placed baby on mother's chest. Mother and grandmother emphasized that often baby tires easily at the breast.  Discussed the option of bottle feeding first and after attempting at the breast. Reviewed limiting total feeding time to 30 min, volume guidelines and target and reminded mother to continue pumping q 3 hours for 15 min. Mother knows to call for help as needed.    Mother does not qualify for [facility] pump.   Maternal Data Has patient been taught Hand Expression?: Yes  Feeding Mother's Current Feeding Choice: Breast Milk and Formula Nipple Type: Nfant Standard Flow (white)  Lactation Tools Discussed/Used Tools: Pump;Flanges Flange Size: 21 Breast pump type: Double-Electric Breast Pump Reason for Pumping: < 6 lbs. Pumping frequency: q 3 hours Pumped volume: 7 mL  Interventions Interventions: Education;DEBP  Discharge Pump: DEBP  Consult Status Consult Status: Follow-up Date: 09/05/23 Follow-up type: In-patient    Hardie Pulley  RN, IBCLC 09/04/2023, 1:57 PM

## 2023-09-04 NOTE — Procedures (Signed)
Post-Placental Nexplanon Insertion Procedure Note  Patient was identified. Informed consent was signed, signed copy in chart. A time-out was performed.    The insertion site was identified 8-10 cm (3-4 inches) from the medial epicondyle of the humerus and 3-5 cm (1.25-2 inches) posterior to (below) the sulcus (groove) between the biceps and triceps muscles of the patient's left arm and marked. The site was prepped and draped in the usual sterile fashion. Pt was prepped with alcohol swab and then injected with 2 cc of lidocaine. The site was prepped with betadine. Nexplanon removed form packaging,  Device confirmed in needle, then inserted full length of needle and withdrawn per handbook instructions. Provider and patient verified presence of the implant in the woman's arm by palpation. Pt insertion site was covered with steristrips/adhesive bandage and pressure bandage. There was minimal blood loss. Patient tolerated procedure well.  Patient was given post procedure instructions and Nexplanon user card with expiration date. Condoms were recommended for STI prevention. Patient was asked to keep the pressure dressing on for 24 hours to minimize bruising and keep the adhesive bandage on for 3-5 days. The patient verbalized understanding of the plan of care and agrees.   Mayra Reel, DO

## 2023-09-04 NOTE — Clinical Social Work Maternal (Signed)
CLINICAL SOCIAL WORK MATERNAL/CHILD NOTE  Patient Details  Name: Misty Jimenez MRN: [id number] Date of Birth: Jan 08, 1996  Date:  09/04/2023  Clinical Social Worker Initiating Note:  Willaim Rayas [address] Date/Time: Initiated:  09/04/23/1322     Child's Name:  Frutoso Chase   Biological Parents:  Mother, Father   Need for Interpreter:  None   Reason for Referral:  Behavioral Health Concerns, Current Substance Use/Substance Use During Pregnancy     Address:  [address] [place] [address]    Phone number:  [phone] (home)     Additional phone number:   Household Members/Support Persons (HM/SP):   Household Member/Support Person 1, Household Member/Support Person 2   HM/SP Name Relationship DOB or Age  HM/SP -1 Mansi Torson spouse 07/29/1980  HM/SP -2 Cherie Engineer, materials grandmother unknown  HM/SP -3        HM/SP -4        HM/SP -5        HM/SP -6        HM/SP -7        HM/SP -8          Natural Supports (not living in the home):      Professional Supports: None   Employment: Unemployed   Type of Work:     Education:  Engineer, agricultural   Homebound arranged:    Surveyor, quantity Resources:  Media planner    Other Resources:  Sales executive  , [facility]   Cultural/Religious Considerations Which May Impact Care:    Strengths:  Ability to meet basic needs  , Merchandiser, retail, Home prepared for child     Psychotropic Medications:         Pediatrician:    Armed forces operational officer area  Pediatrician List:   [employer]    [facility]      Pediatrician Fax Number:    Risk Factors/Current Problems:  Substance Use  , Mental Health Concerns     Cognitive State:  Able to Concentrate  , Alert     Mood/Affect:  Comfortable  , Tearful     CSW Assessment: CSW received a consult for hx of Bipolar, Depression, Anxiety, Food insecurity and hx of substance use and EPDS  score of 11. CSW met with MOB to complete assessment and offer support. CSW entered the room and observed MOB resting in bed, MGM at bedside holding the infant and FOB at bedside. CSW introduced self, CSW role and requested to speak to [facility] in private. MGM and FOB willingly stepped out of the room. CSW explained the reason for the assessment and MOB was agreeable to assessment. CSW inquired about MOB was a feeling MOB reported good and was engaged throughout the assessment. CSW confirmed MOB's demographic information. MOB verified the information was correct.  CSW inquired about MOB MH hx, MOB reported she was diagnosed around age 27. MOB confirmed she has a hx of PTSD, BPD, Bipolar, Anxiety and Depression. CSW inquired about MOB last biporal episode, MOB reported it was Manic and explained her mania consist of "being on go and cleaning the whole house, top to bottom". CSW inquired about treatment, MOB reported she is currently taking Prozac and is interested in therapy. CSW inquired about any other coping skills MOB utilizes, MOB reported she leans on her mom and FOB for support. CSW assessed for safety, MOB denied any SI, HI or  DV. CSW provided education regarding the baby blues period vs. perinatal mood disorders, discussed treatment and gave resources for mental health follow up if concerns arise.  CSW recommends self-evaluation during the postpartum time period using the New Mom Checklist from Postpartum Progress and encouraged MOB to contact a medical professional if symptoms are noted at any time. MOB inquired about MOB mood over the past 7 days, MOB reported she was nervous about the delivery.  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 MOB reported she used THC use to help her keep food down and reduce nausea. CSW informed MOB of the hospital drug  screen policy, MOB verbalized understanding, MOB became tearful and stated "I didn't do it to hurt her" CSW verbalized understanding and explained the process of CSW making a CPS report, MOB verbalized understanding. CSW informed MOB infants UDS was positive for THC and the CDS was pending. CSW inquired about noted food insecurity MOB reported they now received WIC and food stamps. CSW also proved a [employer] and [employer] the [employer].   CSW provided review of Sudden Infant Death Syndrome (SIDS) precautions.  MOB identified [facility] for infants follow up care. MOB reported they have all necessary items for the infant including a pack n play and car seat. -MOB scheduled follow up with [facility] on 11/21 -Patient requests a referral to [facility]. Patient verbalizes understanding that the appointment will be virtual. -CSW made CPS report to [facility] due to infants UDS results.  CSW identifies no further need for intervention and no barriers to discharge at this time.  CSW Plan/Description:  No Further Intervention Required/No Barriers to Discharge, Sudden Infant Death Syndrome (SIDS) Education, Perinatal Mood and Anxiety Disorder (PMADs) Education, CSW Will Continue to Monitor Umbilical Cord Tissue Drug Screen Results and Make Report if Warranted, Child Protective Service Report  , Hospital Drug Screen Policy Information    Maud Deed, [place] 09/04/2023, 1:29 PM

## 2023-09-05 ENCOUNTER — Ambulatory Visit (HOSPITAL_COMMUNITY): Payer: Self-pay

## 2023-09-05 NOTE — Lactation Note (Addendum)
This note was copied from a baby's chart. Lactation Consultation Note  Patient Name: Misty Jimenez  [place] Today's Date: 09/05/2023 Age:25 hours Reason for consult: Initial assessment;Infant < 6lbs;1st time breastfeeding  History of PCOS.    P1,  Baby latched when LC entered room with good depth and intermittent swallows.  Baby more alert today with feeding than yesterday.   Guided baby deeper on breast and turned her toward mother.  Reminded parents tto limit total feeding time at the breast and bottle to 30 min.  Baby un-latched after 15 min.   Father then gave baby 20 ml of 22 kcal formula with Dr. Theora Gianotti bottle.  Baby has increased endurance today. Discussed increasing volume goal 24-28 ml today. Demonstrated how to pace feed. Reminded mother to post pump for 15 min q 3 hours.  Reviewed engorgement care and monitoring voids/stools.  Feeding Mother's Current Feeding Choice: Breast Milk and Formula Nipple Type: Dr. Lorne Skeens  LATCH Score Latch: Grasps breast easily, tongue down, lips flanged, rhythmical sucking.  Audible Swallowing: A few with stimulation  Type of Nipple: Everted at rest and after stimulation  Comfort (Breast/Nipple): Soft / non-tender  Hold (Positioning): Assistance needed to correctly position infant at breast and maintain latch.  LATCH Score: 8   Lactation Tools Discussed/Used  DEBP  Interventions Interventions: Assisted with latch;Education;DEBP  Discharge Discharge Education: Engorgement and breast care;Warning signs for feeding baby Pump: Personal;Hands Free  Consult Status Consult Status: Complete Date: 09/05/23    Dahlia Byes [facility] 09/05/2023, 11:35 AM

## 2023-09-07 ENCOUNTER — Telehealth (HOSPITAL_COMMUNITY): Payer: Self-pay | Admitting: *Deleted

## 2023-09-07 DIAGNOSIS — Z1331 Encounter for screening for depression: Secondary | ICD-10-CM

## 2023-09-07 NOTE — Telephone Encounter (Signed)
Patient scored 11 on EPDS in the hospital with question ten answer being "0-Never".  Placed order for [facility] referral.  Dr. Adrian Blackwater notified via [facility] order.

## 2023-09-08 ENCOUNTER — Telehealth: Payer: Self-pay | Admitting: Clinical

## 2023-09-08 NOTE — Telephone Encounter (Signed)
Attempt call regarding referral; Pt requests to call back at [phone] to schedule virtual consult.

## 2023-09-10 ENCOUNTER — Encounter: Payer: 59 | Admitting: Family Medicine

## 2023-09-10 ENCOUNTER — Encounter: Payer: Self-pay | Admitting: Family Medicine

## 2023-09-14 NOTE — Progress Notes (Signed)
Patient unavailable for MyChart visit. MyChart message sent.

## 2023-09-15 ENCOUNTER — Inpatient Hospital Stay (HOSPITAL_COMMUNITY)
Admission: AD | Admit: 2023-09-15 | Discharge: 2023-09-15 | Disposition: A | Discharge status: Home / Self Care | Payer: 59 | Attending: Obstetrics and Gynecology | Admitting: Obstetrics and Gynecology

## 2023-09-15 ENCOUNTER — Telehealth: Payer: Self-pay | Admitting: Emergency Medicine

## 2023-09-15 ENCOUNTER — Ambulatory Visit (HOSPITAL_BASED_OUTPATIENT_CLINIC_OR_DEPARTMENT_OTHER): Payer: 59

## 2023-09-15 ENCOUNTER — Inpatient Hospital Stay (HOSPITAL_COMMUNITY): Payer: 59

## 2023-09-15 ENCOUNTER — Encounter (HOSPITAL_COMMUNITY): Payer: Self-pay | Admitting: Obstetrics and Gynecology

## 2023-09-15 DIAGNOSIS — M7989 Other specified soft tissue disorders: Secondary | ICD-10-CM

## 2023-09-15 DIAGNOSIS — O9122 Nonpurulent mastitis associated with the puerperium: Secondary | ICD-10-CM | POA: Insufficient documentation

## 2023-09-15 DIAGNOSIS — L299 Pruritus, unspecified: Secondary | ICD-10-CM | POA: Insufficient documentation

## 2023-09-15 DIAGNOSIS — R233 Spontaneous ecchymoses: Secondary | ICD-10-CM | POA: Diagnosis not present

## 2023-09-15 DIAGNOSIS — R001 Bradycardia, unspecified: Secondary | ICD-10-CM | POA: Diagnosis not present

## 2023-09-15 DIAGNOSIS — R079 Chest pain, unspecified: Secondary | ICD-10-CM | POA: Insufficient documentation

## 2023-09-15 DIAGNOSIS — O9089 Other complications of the puerperium, not elsewhere classified: Secondary | ICD-10-CM | POA: Diagnosis not present

## 2023-09-15 DIAGNOSIS — R0602 Shortness of breath: Secondary | ICD-10-CM | POA: Insufficient documentation

## 2023-09-15 DIAGNOSIS — R0989 Other specified symptoms and signs involving the circulatory and respiratory systems: Secondary | ICD-10-CM | POA: Diagnosis not present

## 2023-09-15 DIAGNOSIS — R5383 Other fatigue: Secondary | ICD-10-CM | POA: Diagnosis not present

## 2023-09-15 LAB — D-DIMER, QUANTITATIVE: D-Dimer, Quant: 0.56 ug{FEU}/mL — ABNORMAL HIGH (ref 0.00–0.50)

## 2023-09-15 LAB — PROTIME-INR
INR: 1 (ref 0.8–1.2)
Prothrombin Time: 13.3 s (ref 11.4–15.2)

## 2023-09-15 LAB — CBC WITH DIFFERENTIAL/PLATELET
Abs Immature Granulocytes: 0.02 10*3/uL (ref 0.00–0.07)
Basophils Absolute: 0.1 10*3/uL (ref 0.0–0.1)
Basophils Relative: 1 %
Eosinophils Absolute: 0.1 10*3/uL (ref 0.0–0.5)
Eosinophils Relative: 1 %
HCT: 36.6 % (ref 36.0–46.0)
Hemoglobin: 12.5 g/dL (ref 12.0–15.0)
Immature Granulocytes: 0 %
Lymphocytes Relative: 40 %
Lymphs Abs: 3.1 10*3/uL (ref 0.7–4.0)
MCH: 30.7 pg (ref 26.0–34.0)
MCHC: 34.2 g/dL (ref 30.0–36.0)
MCV: 89.9 fL (ref 80.0–100.0)
Monocytes Absolute: 0.6 10*3/uL (ref 0.1–1.0)
Monocytes Relative: 8 %
Neutro Abs: 3.8 10*3/uL (ref 1.7–7.7)
Neutrophils Relative %: 50 %
Platelets: 358 10*3/uL (ref 150–400)
RBC: 4.07 MIL/uL (ref 3.87–5.11)
RDW: 12.6 % (ref 11.5–15.5)
WBC: 7.7 10*3/uL (ref 4.0–10.5)
nRBC: 0 % (ref 0.0–0.2)

## 2023-09-15 LAB — COMPREHENSIVE METABOLIC PANEL
ALT: 11 U/L (ref 0–44)
AST: 20 U/L (ref 15–41)
Albumin: 3.1 g/dL — ABNORMAL LOW (ref 3.5–5.0)
Alkaline Phosphatase: 91 U/L (ref 38–126)
Anion gap: 8 (ref 5–15)
BUN: 12 mg/dL (ref 6–20)
CO2: 24 mmol/L (ref 22–32)
Calcium: 9.2 mg/dL (ref 8.9–10.3)
Chloride: 109 mmol/L (ref 98–111)
Creatinine, Ser: 0.91 mg/dL (ref 0.44–1.00)
GFR, Estimated: >60 mL/min (ref >60)
Glucose, Bld: 85 mg/dL (ref 70–99)
Potassium: 3.5 mmol/L (ref 3.5–5.1)
Sodium: 141 mmol/L (ref 135–145)
Total Bilirubin: 0.5 mg/dL (ref <1.2)
Total Protein: 6 g/dL — ABNORMAL LOW (ref 6.5–8.1)

## 2023-09-15 LAB — RESPIRATORY PANEL BY PCR

## 2023-09-15 MED ORDER — IBUPROFEN 800 MG PO TABS
800.0000 mg | ORAL_TABLET | Freq: Once | ORAL | Status: AC
  Administered 2023-09-15: 800 mg via ORAL
  Filled 2023-09-15: qty 1

## 2023-09-15 MED ORDER — LORATADINE 10 MG PO TABS: 10.0000 mg | ORAL_TABLET | Freq: Every day | ORAL | 2 refills | Status: DC

## 2023-09-15 MED ORDER — FAMOTIDINE 40 MG PO TABS: 40.0000 mg | ORAL_TABLET | Freq: Every day | ORAL | 0 refills | Status: DC

## 2023-09-15 MED ORDER — MISOPROSTOL 200 MCG PO TABS: ORAL_TABLET | ORAL | 0 refills | Status: DC

## 2023-09-15 MED ORDER — FAMOTIDINE 20 MG PO TABS
40.0000 mg | ORAL_TABLET | Freq: Once | ORAL | Status: AC
  Administered 2023-09-15: 40 mg via ORAL
  Filled 2023-09-15: qty 2

## 2023-09-15 NOTE — MAU Provider Note (Signed)
History     CSN: [id number]  Arrival date and time: 09/15/23 1430   None     Chief Complaint  Patient presents with   Bleeding/Bruising   Breast Problem   Sore Throat   Chest Pain   Nasal Congestion   Patient presents postpartum with respiratory symptoms of sore throat, nasal congestions, fatigue and HA for the past several days. Was seen at urgent care for these concerns along with breast pain. Diagnosed with mastitis (left breast) at [facility]. Prescription sent from UC. Patient reports the providers there were concerned d/t multiple bruises noted. Bruises noted on all extremities and one noted on abdomen. Denies a reason for bruising. Feels safe at home.  She reports moderate-heavy lochia, changing pads every few hours. Had intercourse 2 days ago, felt pain afterward and would like stitches checked.  No respiratory swabs at UC per patient report.     OB History     Gravida  1   Para  1   Term  1   Preterm  0   AB  0   Living  1      SAB  0   IAB  0   Ectopic  0   Multiple  0   Live Births  1           Past Medical History:  Diagnosis Date   Abdominal pain, chronic, right lower quadrant 08/03/2013   Abdominal pain, recurrent    With Headache   Acute pyelonephritis 05/07/2016   kidney surgery at 61 months of age   Acute respiratory failure with hypoxia (HCC) 08/24/2023   ADHD (attention deficit hyperactivity disorder)    Allergy    Anxiety    Bipolar affective disorder, current episode manic with psychotic symptoms (HCC) 02/28/2021   "I was so depressed after losing my job, I was thinking of cutting myself."     Bulimia 04/17/2023   Cannabis hyperemesis syndrome concurrent with and due to cannabis abuse (HCC) 10/12/2020   Closed fracture of distal end of left radius 06/19/2016   Current smoker 10/28/2018   Depression    Endometriosis    Endometritis 07/23/2017   Family history of adverse reaction to anesthesia    "grandmother gets PONV"   GE reflux  12/16/2011   Hyperlipidemia    Kidney disease 05/07/2016   Left breast abscess 12/18/2021   LGSIL on Pap smear of cervix 04/13/2023   03/2023 LSIL - PAP in 1 year     Moderate cannabis use disorder (HCC) 10/28/2018   Obesity 05/07/2016   PCOS (polycystic ovarian syndrome)    Preventative health care 11/04/2016   Reflux, vesicoureteral    Respiratory illness with fever 12/27/2019   Wrist pain, left 11/04/2016    Past Surgical History:  Procedure Laterality Date   KIDNEY SURGERY     As a small child   TYMPANOSTOMY TUBE PLACEMENT     URETER SURGERY     WRIST SURGERY Left 05/2016   10 pins and 2 plates    Family History  Problem Relation Age of Onset   Kidney disease Mother    Alcohol abuse Father        and drug use, heroin, cocaine, marijuana   Migraines Maternal Aunt    Cancer Paternal Aunt        colon in 58s deceased   Asthma Maternal Grandmother    Diabetes Maternal Grandfather    Hyperlipidemia Other    Hypertension Other    Depression Other  Stroke Neg Hx     Social History   Tobacco Use   Smoking status: Some Days    Current packs/day: 0.00    Average packs/day: 0.3 packs/day for 2.0 years (0.5 ttl pk-yrs)    Types: Cigars, Cigarettes    Start date: 10/27/2013    Last attempt to quit: 10/28/2015    Years since quitting: 7.8   Smokeless tobacco: Former    Types: Chew   Tobacco comments:    Pt declines information  Vaping Use   Vaping status: Former  Substance Use Topics   Alcohol use: Not Currently    Alcohol/week: 2.0 - 5.0 standard drinks of alcohol    Types: 2 - 5 Cans of beer per week    Comment: 3-4 x week   Drug use: Not Currently    Types: Marijuana    Allergies:  Allergies  Allergen Reactions   Sulfa Antibiotics Hives and Rash   Sulfasalazine Hives   Amphetamines     Reports "different parts of her body melt and causes hallucinations."   Other     Fiberglass cast caused rash and hives   Monosodium Glutamate Nausea And Vomiting, Rash  and Other (See Comments)    Headache and dizziness   Sulfa Drugs Cross Reactors Rash    No medications prior to admission.    Review of Systems  Constitutional:  Positive for fatigue. Negative for chills, fever and unexpected weight change.       Reports sleep disturbance d/t newborn at home.    Respiratory:  Positive for shortness of breath. Negative for cough.   Cardiovascular:  Positive for chest pain. Negative for palpitations.  Gastrointestinal:  Positive for abdominal pain. Negative for constipation, diarrhea, nausea and vomiting.  Genitourinary:  Negative for difficulty urinating, flank pain, frequency and urgency.  Neurological:  Negative for dizziness and light-headedness.  Hematological:  Bruises/bleeds easily.   Physical Exam   Blood pressure (!) 121/90, pulse (!) 53, temperature 98.4 F (36.9 C), temperature source Oral, resp. rate 20, SpO2 99%, currently breastfeeding.  Physical Exam Vitals reviewed. Exam conducted with a chaperone present.  Constitutional:      Appearance: Normal appearance.  HENT:     Head: Normocephalic.  Cardiovascular:     Pulses: Normal pulses.  Pulmonary:     Effort: Pulmonary effort is normal.  Chest:  Breasts:    Right: Mass present.  Genitourinary:    General: Normal vulva.     Exam position: Lithotomy position.     Comments: Lochia noted, appropriate amount.  FF at U-2. Stitches on R labia appear intact. Tissues atraumatic.  Musculoskeletal:     Right lower leg: No edema.     Left lower leg: No edema.  Skin:    General: Skin is warm and dry.     Capillary Refill: Capillary refill takes less than 2 seconds.     Findings: Bruising present.     Comments: Diffuse bruising on all extremities and one noted on abdomen.   Neurological:     Mental Status: She is alert and oriented to person, place, and time.  Psychiatric:        Mood and Affect: Mood normal.        Behavior: Behavior normal.        Thought Content: Thought content  normal.        Judgment: Judgment normal.     MAU Course   Results for orders placed or performed during the hospital encounter of 09/15/23 (  from the past 24 hour(s))  Respiratory (~20 pathogens) panel by PCR     Status: None   Collection Time: 09/15/23  3:37 PM   Specimen: Nasopharyngeal Swab; Respiratory  Result Value Ref Range   Adenovirus NOT DETECTED NOT DETECTED   Coronavirus 229E NOT DETECTED NOT DETECTED   Coronavirus HKU1 NOT DETECTED NOT DETECTED   Coronavirus NL63 NOT DETECTED NOT DETECTED   Coronavirus OC43 NOT DETECTED NOT DETECTED   Metapneumovirus NOT DETECTED NOT DETECTED   Rhinovirus / Enterovirus NOT DETECTED NOT DETECTED   Influenza A NOT DETECTED NOT DETECTED   Influenza B NOT DETECTED NOT DETECTED   Parainfluenza Virus 1 NOT DETECTED NOT DETECTED   Parainfluenza Virus 2 NOT DETECTED NOT DETECTED   Parainfluenza Virus 3 NOT DETECTED NOT DETECTED   Parainfluenza Virus 4 NOT DETECTED NOT DETECTED   Respiratory Syncytial Virus NOT DETECTED NOT DETECTED   Bordetella pertussis NOT DETECTED NOT DETECTED   Bordetella Parapertussis NOT DETECTED NOT DETECTED   Chlamydophila pneumoniae NOT DETECTED NOT DETECTED   Mycoplasma pneumoniae NOT DETECTED NOT DETECTED  CBC with Differential/Platelet     Status: None   Collection Time: 09/15/23  3:55 PM  Result Value Ref Range   WBC 7.7 4.0 - 10.5 K/uL   RBC 4.07 3.87 - 5.11 MIL/uL   Hemoglobin 12.5 12.0 - 15.0 g/dL   HCT 47.8 29.5 - 62.1 %   MCV 89.9 80.0 - 100.0 fL   MCH 30.7 26.0 - 34.0 pg   MCHC 34.2 30.0 - 36.0 g/dL   RDW 30.8 65.7 - 84.6 %   Platelets 358 150 - 400 K/uL   nRBC 0.0 0.0 - 0.2 %   Neutrophils Relative % 50 %   Neutro Abs 3.8 1.7 - 7.7 K/uL   Lymphocytes Relative 40 %   Lymphs Abs 3.1 0.7 - 4.0 K/uL   Monocytes Relative 8 %   Monocytes Absolute 0.6 0.1 - 1.0 K/uL   Eosinophils Relative 1 %   Eosinophils Absolute 0.1 0.0 - 0.5 K/uL   Basophils Relative 1 %   Basophils Absolute 0.1 0.0 - 0.1 K/uL    Immature Granulocytes 0 %   Abs Immature Granulocytes 0.02 0.00 - 0.07 K/uL  Comprehensive metabolic panel     Status: Abnormal   Collection Time: 09/15/23  3:55 PM  Result Value Ref Range   Sodium 141 135 - 145 mmol/L   Potassium 3.5 3.5 - 5.1 mmol/L   Chloride 109 98 - 111 mmol/L   CO2 24 22 - 32 mmol/L   Glucose, Bld 85 70 - 99 mg/dL   BUN 12 6 - 20 mg/dL   Creatinine, Ser 9.62 0.44 - 1.00 mg/dL   Calcium 9.2 8.9 - 95.2 mg/dL   Total Protein 6.0 (L) 6.5 - 8.1 g/dL   Albumin 3.1 (L) 3.5 - 5.0 g/dL   AST 20 15 - 41 U/L   ALT 11 0 - 44 U/L   Alkaline Phosphatase 91 38 - 126 U/L   Total Bilirubin 0.5 <1.2 mg/dL   GFR, Estimated >84 >13 mL/min   Anion gap 8 5 - 15  D-dimer, quantitative     Status: Abnormal   Collection Time: 09/15/23  3:55 PM  Result Value Ref Range   D-Dimer, Quant 0.56 (H) 0.00 - 0.50 ug/mL-FEU  Protime-INR     Status: None   Collection Time: 09/15/23  3:55 PM  Result Value Ref Range   Prothrombin Time 13.3 11.4 - 15.2 seconds  INR 1.0 0.8 - 1.2   VAS [place] LOWER EXTREMITY VENOUS (DVT) (ONLY [facility] & [facility])  Result Date: 09/15/2023  Lower Venous DVT Study Patient Name:  Misty Jimenez  Date of Exam:   09/15/2023 Medical Rec #: [id number]          Accession #:    [id number] Date of Birth: May 19, 1996          Patient Gender: F Patient Age:   27 years Exam Location:  [facility] Procedure:      VAS [place] LOWER EXTREMITY VENOUS (DVT) Referring Phys: Huntley Dec [place] --------------------------------------------------------------------------------  Indications: Pain.  Risk Factors: Recent pregnancy - delivery on 09/02/2023. Comparison Study: No previous exams Performing Technologist: Jody Hill RVT, RDMS  Examination Guidelines: A complete evaluation includes B-mode imaging, spectral Doppler, color Doppler, and power Doppler as needed of all accessible portions of each vessel. Bilateral testing is considered an integral part of a complete examination. Limited examinations  for reoccurring indications may be performed as noted. The reflux portion of the exam is performed with the patient in reverse Trendelenburg.  +---------+---------------+---------+-----------+----------+--------------+ RIGHT    CompressibilityPhasicitySpontaneityPropertiesThrombus Aging +---------+---------------+---------+-----------+----------+--------------+ CFV      Full           Yes      Yes                                 +---------+---------------+---------+-----------+----------+--------------+ SFJ      Full                                                        +---------+---------------+---------+-----------+----------+--------------+ FV Prox  Full           Yes      Yes                                 +---------+---------------+---------+-----------+----------+--------------+ FV Mid   Full           Yes      Yes                                 +---------+---------------+---------+-----------+----------+--------------+ FV DistalFull           Yes      Yes                                 +---------+---------------+---------+-----------+----------+--------------+ PFV      Full                                                        +---------+---------------+---------+-----------+----------+--------------+ POP      Full           Yes      Yes                                 +---------+---------------+---------+-----------+----------+--------------+ PTV  Full                                                        +---------+---------------+---------+-----------+----------+--------------+ PERO     Full                                                        +---------+---------------+---------+-----------+----------+--------------+   +---------+---------------+---------+-----------+----------+--------------+ LEFT     CompressibilityPhasicitySpontaneityPropertiesThrombus Aging  +---------+---------------+---------+-----------+----------+--------------+ CFV      Full           Yes      Yes                                 +---------+---------------+---------+-----------+----------+--------------+ SFJ      Full                                                        +---------+---------------+---------+-----------+----------+--------------+ FV Prox  Full           Yes      Yes                                 +---------+---------------+---------+-----------+----------+--------------+ FV Mid   Full           Yes      Yes                                 +---------+---------------+---------+-----------+----------+--------------+ FV DistalFull           Yes      Yes                                 +---------+---------------+---------+-----------+----------+--------------+ PFV      Full                                                        +---------+---------------+---------+-----------+----------+--------------+ POP      Full           Yes      Yes                                 +---------+---------------+---------+-----------+----------+--------------+ PTV      Full                                                        +---------+---------------+---------+-----------+----------+--------------+ PERO  Full                                                        +---------+---------------+---------+-----------+----------+--------------+     Summary: BILATERAL: - No evidence of deep vein thrombosis seen in the lower extremities, bilaterally. -No evidence of popliteal cyst, bilaterally.   *See table(s) above for measurements and observations.    Preliminary     MDM PE Labs: CBC, CMP, [place], CXR, EKG, D Dimer, PT INR, Protein/creatinine ratio   Assessment and Plan  27yo  G1 P1001  PP   - Mastitis, treated by Urgent Care provider. Pain medication ordered. - Pruritus without rash. Pepcid ordered.  - Concern for hematological  abnormality/clotting disfunction/thrombosis.  - Labs benign.  - Patient with overall lochia WNL, benign exam.   - Exam findings discussed. - Rx: Orders for pepcid, loratidine for pruritus. Cytotec sent for uterine subinvolution concerns per patient. Low suspicion for abnormal lochia.   - Return to MAU PRN. - Complete all antibiotic treatment for mastitis. - Discharged home in stable condition.    Richardson Landry MSN, CNM 09/15/2023, 7:29 PM

## 2023-09-15 NOTE — MAU Note (Signed)
.  Misty Jimenez is a 27 y.o. at Unknown here in MAU reporting: about 4-5 days ago started having sore throat, nasal congestion, fatigue, generalized itching, HA (2/10), floaters (on-going since [redacted] weeks GA) and breast discomfort. Went to [facility] today for breast engorgement, warmth, and pain - diagnosed with mastitis of left breast. UC sent prescription to her pharmacy. UC was concerned with patient's generalized bruising and wanted her to follow up in MAU for that. She reports moderate to heavy vaginal bleeding and reports changing pads frequently. Last had intercourse 2 days ago - she would like to have her stiches checked. Denies abnormal discharge. No respiratory test done at [facility].  LMP: N/A Onset of complaint: 4-5 days ago Pain score: 10/10 Vitals:   09/15/23 1454  BP: 125/84  Pulse: 60  Resp: 20  Temp: 98.4 F (36.9 C)  SpO2: 99%     FHT:N/A Lab orders placed from triage:  N/A

## 2023-09-15 NOTE — Progress Notes (Signed)
BLE venous duplex has been completed.  Preliminary results given to Lamont Snowball, CNM.   Results can be found under chart review under CV PROC. 09/15/2023 7:10 PM Tayden Duran RVT, RDMS

## 2023-09-15 NOTE — Telephone Encounter (Signed)
Pt calls and states that she was just seen in UC for symptoms of mastitis and treated. Pt also reports concerns of bruising on her body, fever and SOB. This RN instructed pt to present to MAU/ED for immediate evaluation of symptoms.

## 2023-09-16 ENCOUNTER — Telehealth (HOSPITAL_COMMUNITY): Payer: Self-pay | Admitting: *Deleted

## 2023-09-16 NOTE — Telephone Encounter (Addendum)
09/16/2023  Name: Misty Jimenez MRN: [id number] DOB: 02/24/1996  Reason for Call:  Transition of Care Hospital Discharge Call  Contact Status: Patient Contact Status: Complete  Language assistant needed: Interpreter Mode: Interpreter Not Needed        Follow-Up Questions: Do You Have Any Concerns About Your Health As You Heal From Delivery?: No Do You Have Any Concerns About Your Infants Health?: No  Edinburgh Postnatal Depression Scale:  In the Past 7 Days: I have been able to laugh and see the funny side of things.: Not quite so much now I have looked forward with enjoyment to things.: As much as I ever did I have blamed myself unnecessarily when things went wrong.: Yes, most of the time I have been anxious or worried for no good reason.: Yes, very often I have felt scared or panicky for no good reason.: Yes, sometimes Things have been getting on top of me.: Yes, sometimes I haven't been coping as well as usual I have been so unhappy that I have had difficulty sleeping.: Not at all I have felt sad or miserable.: Yes, most of the time I have been so unhappy that I have been crying.: Yes, quite often The thought of harming myself has occurred to me.: Hardly ever Edinburgh Postnatal Depression Scale Total: (!) 17  Phoned Dr. Crissie Reese with EPDS result and patient's plan to schedule an appointment with Jerrye Noble at [facility].  Also shared with him that patient says she needs a refill prescription for Prozac as she has run out of that medication. She also said that Abilify has worked well for her before, but that she is breastfeeding and wants to make sure that Abilify is safe with breastfeeding.  Dr. Crissie Reese said that he will follow-up with the patient's prescriptions.  PHQ2-9 Depression Scale:     Discharge Follow-up: Edinburgh score requires follow up?: Yes Provider notified of Edinburgh score?: Yes Have you already been referred for a counseling appointment?: Yes  (Patient says she does not want to see [facility] provider at [facility].  She plans to schedule an appointment at [facility] with Jerrye Noble.  Encouraged patient to schedule an appointment.) Date of appointment::  (not yet scheduled) Patient was advised of the following resources:: Support Group, Breastfeeding Support Group  Post-discharge interventions: Reviewed Newborn Safe Sleep Practices Provided Maternal Mental Health resources  Salena Saner, RN 09/16/2023 15:43

## 2023-09-17 ENCOUNTER — Other Ambulatory Visit: Payer: Self-pay | Admitting: Advanced Practice Midwife

## 2023-09-23 ENCOUNTER — Telehealth: Payer: Self-pay

## 2023-09-23 MED ORDER — FLUOXETINE HCL 40 MG PO CAPS: 80.0000 mg | ORAL_CAPSULE | Freq: Every day | ORAL | 1 refills | Status: DC

## 2023-09-23 NOTE — Telephone Encounter (Signed)
Ok to increase prozac to 80mg  daily. Can you make sure she has a psychiatric provider? If not, then she will need a referral to behavioral health.

## 2023-09-23 NOTE — Telephone Encounter (Signed)
Patient's [facility] nurse Angelique Blonder called stating patient scored a 13 on her Ed[place]burg today and a 17 last month. Patient is requesting her Prozac to be increased to 80 mg or to start Abilify. Patient is unsure if Abilify is safe to take while breast feeding. A message will be sent to the provider. Hiro Vipond l Fenix Rorke, CMA

## 2023-09-23 NOTE — Addendum Note (Signed)
Addended by: Levie Heritage on: 09/23/2023 03:02 PM   Modules accepted: Orders

## 2023-09-30 ENCOUNTER — Other Ambulatory Visit (INDEPENDENT_AMBULATORY_CARE_PROVIDER_SITE_OTHER): Payer: Self-pay | Admitting: Certified Nurse Midwife

## 2023-10-05 ENCOUNTER — Other Ambulatory Visit: Payer: Self-pay | Admitting: Obstetrics and Gynecology

## 2023-10-07 ENCOUNTER — Encounter: Payer: Self-pay | Admitting: Family Medicine

## 2023-10-07 ENCOUNTER — Ambulatory Visit (INDEPENDENT_AMBULATORY_CARE_PROVIDER_SITE_OTHER): Payer: 59 | Admitting: Family Medicine

## 2023-10-07 VITALS — BP 124/76 | HR 61 | Ht 67.0 in | Wt 196.0 lb

## 2023-10-07 DIAGNOSIS — F3181 Bipolar II disorder: Secondary | ICD-10-CM

## 2023-10-07 DIAGNOSIS — N939 Abnormal uterine and vaginal bleeding, unspecified: Secondary | ICD-10-CM | POA: Diagnosis not present

## 2023-10-07 DIAGNOSIS — M545 Low back pain, unspecified: Secondary | ICD-10-CM

## 2023-10-07 MED ORDER — HYDROCODONE-ACETAMINOPHEN 5-325 MG PO TABS: 1.0000 | ORAL_TABLET | ORAL | 0 refills | Status: DC | PRN

## 2023-10-07 NOTE — Progress Notes (Unsigned)
Post Partum Visit Note  Misty Jimenez is a 27 y.o. G13P1001 female who presents for a postpartum visit. She is 5 weeks postpartum following a normal spontaneous vaginal delivery.  I have fully reviewed the prenatal and intrapartum course. The delivery was at [redacted]w[redacted]d gestational weeks.  Anesthesia: epidural. Postpartum course has been complicated - passing clots, severe back pain, nausea, vomiting, mastitis. Baby is doing well. Baby is feeding by breast. Bleeding clots. Bowel function is abnormal. Bladder function is normal. Patient is sexually active. Contraception method is Nexplanon. Postpartum depression screening: positive.  Patient is having extreme lumbar back pain that radiates up into the thoracic spine.  This pain has been ongoing since delivery.  She did have an epidural spinal. Pain is worse with movement and she has difficulty walking, bending. No numbness or weakness to the point of her leg giving out on her. She does report fevers, chills off and on over the last several weeks.  In addition, she has pelvic pain and heavy bleeding - she passes large clots frequently.   She did get seen and treated for mastitis, although the antibiotics did not seem to help. She continues to have stinging breast pain that is most prominent after she finishes breast feeding. The pain is located throughout her entire breast and not just on her nipple.   The pregnancy intention screening data noted above was reviewed. Potential methods of contraception were discussed. The patient elected to proceed with No data recorded.   Edinburgh Postnatal Depression Scale - 10/07/23 0919       Edinburgh Postnatal Depression Scale:  In the Past 7 Days   I have been able to laugh and see the funny side of things. 1    I have looked forward with enjoyment to things. 0    I have blamed myself unnecessarily when things went wrong. 3    I have been anxious or worried for no good reason. 3    I have felt scared or  panicky for no good reason. 3    Things have been getting on top of me. 3    I have been so unhappy that I have had difficulty sleeping. 0    I have felt sad or miserable. 0    I have been so unhappy that I have been crying. 0    The thought of harming myself has occurred to me. 0    Edinburgh Postnatal Depression Scale Total 13             Health Maintenance Due  Topic Date Due   COVID-19 Vaccine (1 - 2023-24 season) Never done    The following portions of the patient's history were reviewed and updated as appropriate: allergies, current medications, past family history, past medical history, past social history, past surgical history, and problem list.  Review of Systems Pertinent items are noted in HPI.  Objective:  BP 124/76   Pulse 61   Ht 5\' 7"  (1.702 m)   Wt 196 lb (88.9 kg)   LMP 12/03/2022 (Approximate)   Breastfeeding Yes   BMI 30.70 kg/m    General:  alert, cooperative, and no distress   Breasts:  Normal breasts bilaterally. No erythema or masses.  Lungs: clear to auscultation bilaterally  Heart:  regular rate and rhythm, S1, S2 normal, no murmur, click, rub or gallop  Abdomen: soft, non-tender; bowel sounds normal; no masses,  no organomegaly   MSK: Tenderness to the spine from L3 to T11.  Wound N/a  GU exam:   Normal external genitalia, vaginal mucosa, cervix. Uterine tenderness. Uterus is involuted and normal size.       Assessment:   1. Postpartum exam  2. Abnormal uterine bleeding (AUB) (Primary) Will check [place]. - US PELVIC COMPLETE WITH TRANSVAGINAL; Future - CBC  3. Lumbar back pain As she is having fevers/chills after neuroaxial anesthesia, will get MRI. - MR LUMBAR SPINE W CONTRAST; Future  4. Bipolar II disorder with rapid cycling (HCC) On prozac.   Plan:   Essential components of care per ACOG recommendations:  1.  Mood and well being: Patient with positive depression screening today. Reviewed local resources for support.  -  Patient tobacco use? No.   - hx of drug use? No.    2. Infant care and feeding:  -Patient currently breastmilk feeding? Yes. Reviewed importance of draining breast regularly to support lactation.  -Social determinants of health (SDOH) reviewed in EPIC. No concerns  3. Sexuality, contraception and birth spacing - Patient does not want a pregnancy in the next year.   - Reviewed reproductive life planning. Reviewed contraceptive methods based on pt preferences and effectiveness.  Patient desired Hormonal Implant today.   - Discussed birth spacing of 18 months  4. Sleep and fatigue -Encouraged family/partner/community support of 4 hrs of uninterrupted sleep to help with mood and fatigue  5. Physical Recovery  - Discussed patients delivery and complications. She describes her labor as good. - Patient had a Vaginal, no problems at delivery. Patient had a  labial  laceration. Perineal healing reviewed. Patient expressed understanding - Patient has urinary incontinence? No. - Patient is not safe to resume physical and sexual activity  6.  Health Maintenance - HM due items addressed Yes - Last pap smear  Diagnosis  Date Value Ref Range Status  04/08/2023 - Low grade squamous intraepithelial lesion (LSIL) (A)  Final   Pap smear not done at today's visit.  -Breast Cancer screening indicated? No.   7. Chronic Disease/Pregnancy Condition follow up:  return for colposcopy  - PCP follow up  Leola Brazil, RN [facility] [employer], [facility]

## 2023-10-08 LAB — CBC
Hematocrit: 41 % (ref 34.0–46.6)
Hemoglobin: 13.3 g/dL (ref 11.1–15.9)
MCH: 31.1 pg (ref 26.6–33.0)
MCHC: 32.4 g/dL (ref 31.5–35.7)
MCV: 96 fL (ref 79–97)
Platelets: 386 10*3/uL (ref 150–450)
RBC: 4.27 x10E6/uL (ref 3.77–5.28)
RDW: 11.6 % — ABNORMAL LOW (ref 11.7–15.4)
WBC: 9.3 10*3/uL (ref 3.4–10.8)

## 2023-10-09 ENCOUNTER — Ambulatory Visit (HOSPITAL_BASED_OUTPATIENT_CLINIC_OR_DEPARTMENT_OTHER)
Admission: RE | Admit: 2023-10-09 | Discharge: 2023-10-09 | Disposition: A | Discharge status: Home / Self Care | Payer: 59 | Source: Ambulatory Visit | Attending: Family Medicine | Admitting: Family Medicine

## 2023-10-09 DIAGNOSIS — N939 Abnormal uterine and vaginal bleeding, unspecified: Secondary | ICD-10-CM | POA: Insufficient documentation

## 2023-10-14 ENCOUNTER — Other Ambulatory Visit: Payer: Self-pay | Admitting: Family Medicine

## 2023-10-14 ENCOUNTER — Other Ambulatory Visit: Payer: Self-pay

## 2023-10-14 DIAGNOSIS — M545 Low back pain, unspecified: Secondary | ICD-10-CM

## 2023-10-16 ENCOUNTER — Encounter: Payer: Self-pay | Admitting: Family Medicine

## 2023-10-18 ENCOUNTER — Ambulatory Visit (HOSPITAL_BASED_OUTPATIENT_CLINIC_OR_DEPARTMENT_OTHER): Payer: 59

## 2023-10-18 ENCOUNTER — Encounter (HOSPITAL_BASED_OUTPATIENT_CLINIC_OR_DEPARTMENT_OTHER): Payer: Self-pay

## 2023-10-18 ENCOUNTER — Ambulatory Visit (HOSPITAL_BASED_OUTPATIENT_CLINIC_OR_DEPARTMENT_OTHER): Admission: RE | Admit: 2023-10-18 | Payer: 59 | Source: Ambulatory Visit

## 2023-10-24 ENCOUNTER — Emergency Department (HOSPITAL_BASED_OUTPATIENT_CLINIC_OR_DEPARTMENT_OTHER): Payer: 59

## 2023-10-24 ENCOUNTER — Encounter (HOSPITAL_BASED_OUTPATIENT_CLINIC_OR_DEPARTMENT_OTHER): Payer: Self-pay | Admitting: Emergency Medicine

## 2023-10-24 ENCOUNTER — Emergency Department (HOSPITAL_BASED_OUTPATIENT_CLINIC_OR_DEPARTMENT_OTHER)
Admission: EM | Admit: 2023-10-24 | Discharge: 2023-10-24 | Disposition: A | Discharge status: Home / Self Care | Payer: 59 | Attending: Emergency Medicine | Admitting: Emergency Medicine

## 2023-10-24 ENCOUNTER — Other Ambulatory Visit: Payer: Self-pay

## 2023-10-24 DIAGNOSIS — R059 Cough, unspecified: Secondary | ICD-10-CM | POA: Diagnosis present

## 2023-10-24 DIAGNOSIS — Z20822 Contact with and (suspected) exposure to covid-19: Secondary | ICD-10-CM | POA: Insufficient documentation

## 2023-10-24 DIAGNOSIS — J069 Acute upper respiratory infection, unspecified: Secondary | ICD-10-CM | POA: Insufficient documentation

## 2023-10-24 DIAGNOSIS — L0291 Cutaneous abscess, unspecified: Secondary | ICD-10-CM

## 2023-10-24 DIAGNOSIS — N764 Abscess of vulva: Secondary | ICD-10-CM | POA: Insufficient documentation

## 2023-10-24 LAB — RESP PANEL BY RT-PCR (RSV, FLU A&B, COVID)  RVPGX2
Influenza A by PCR: NEGATIVE
Influenza B by PCR: NEGATIVE
Resp Syncytial Virus by PCR: NEGATIVE
SARS Coronavirus 2 by RT PCR: NEGATIVE

## 2023-10-24 MED ORDER — ALBUTEROL SULFATE (2.5 MG/3ML) 0.083% IN NEBU
2.5000 mg | INHALATION_SOLUTION | Freq: Once | RESPIRATORY_TRACT | Status: AC
  Administered 2023-10-24: 2.5 mg via RESPIRATORY_TRACT
  Filled 2023-10-24: qty 3

## 2023-10-24 MED ORDER — ALBUTEROL SULFATE HFA 108 (90 BASE) MCG/ACT IN AERS: 1.0000 | INHALATION_SPRAY | Freq: Four times a day (QID) | RESPIRATORY_TRACT | 0 refills | Status: DC | PRN

## 2023-10-24 MED ORDER — AMOXICILLIN-POT CLAVULANATE 875-125 MG PO TABS: 1.0000 | ORAL_TABLET | Freq: Two times a day (BID) | ORAL | 0 refills | Status: DC

## 2023-10-24 NOTE — ED Triage Notes (Signed)
States has had congestion and cough x 3 days. Also has a "boil" on her private area for 2 days. 7 weeks postpartum and is breast feeding

## 2023-10-24 NOTE — ED Provider Notes (Signed)
[place] [facility] [place] Provider Note   CSN: [id number] Arrival date & time: 10/24/23  0749     History  Chief Complaint  Patient presents with   Cough    ? abscess    Misty Jimenez is a 27 y.o. female with past medical history of endometriosis, PCOS, hyperlipidemia, bipolar disorder, allergies presenting to emergency room with 3 days of rhinorrhea, congestion, cough, sore throat and feeling unwell.  Patient reports she has had some shortness of breath and wheezing at home intermittently and at night.  Patient has no history of asthma.  Patient also reports that she has had a boil on the left labia for 2 days.  Has had this before never requiring incision and drainage.  Reports boil is very small.  No fevers chills abdominal pain nausea vomiting.   Cough Associated symptoms: rash        Home Medications Prior to Admission medications   Medication Sig Start Date End Date Taking? Authorizing Provider  coconut oil OIL Apply 1 Application topically as needed. 09/04/23   Wyn Forster, MD  cyclobenzaprine (FLEXERIL) 5 MG tablet Take 1 tablet (5 mg total) by mouth 3 (three) times daily as needed for muscle spasms. 08/28/23   Joanne Gavel, MD  famotidine (PEPCID) 20 MG tablet Take 1 tablet (20 mg total) by mouth 2 (two) times daily. 07/15/23   Levie Heritage, DO  famotidine (PEPCID) 40 MG tablet Take 1 tablet (40 mg total) by mouth daily. Patient not taking: Reported on 10/07/2023 09/15/23   Richardson Landry, CNM  FLUoxetine (PROZAC) 20 MG capsule TAKE 1 CAPSULE(20 MG) BY MOUTH DAILY 10/14/23   Levie Heritage, DO  FLUoxetine (PROZAC) 40 MG capsule Take 2 capsules (80 mg total) by mouth daily. 09/23/23   Levie Heritage, DO  HYDROcodone-acetaminophen (NORCO/VICODIN) 5-325 MG tablet Take 1 tablet by mouth every 4 (four) hours as needed. 10/07/23   Levie Heritage, DO  ibuprofen (ADVIL) 600 MG tablet TAKE 1 TABLET(600 MG) BY MOUTH EVERY 6  HOURS 10/05/23   Wyn Forster, MD  loratadine (CLARITIN) 10 MG tablet Take 1 tablet (10 mg total) by mouth daily. Patient not taking: Reported on 10/07/2023 09/15/23   Richardson Landry, CNM  misoprostol (CYTOTEC) 200 MCG tablet Place four tablets in between your gums and cheeks (two tablets on each side) as instructed. Patient not taking: Reported on 10/07/2023 09/15/23   Richardson Landry, CNM  ondansetron (ZOFRAN) 8 MG tablet Take 1 tablet (8 mg total) by mouth every 8 (eight) hours as needed for nausea or vomiting. Patient not taking: Reported on 10/07/2023 07/12/23   Katrinka Blazing, [place], CNM  pantoprazole (PROTONIX) 20 MG tablet Take 1 tablet (20 mg total) by mouth daily. Patient not taking: Reported on 10/07/2023 08/20/23   Aviva Signs, CNM  prenatal vitamin w/FE, FA (NATACHEW) 29-1 MG CHEW chewable tablet Chew 1 tablet by mouth daily at 12 noon. Patient not taking: Reported on 06/10/2023    [provider]  senna-docusate (SENOKOT-S) 8.6-50 MG tablet Take 2 tablets by mouth daily. 09/04/23   Wyn Forster, MD  simethicone (MYLICON) 80 MG chewable tablet Chew 1 tablet (80 mg total) by mouth as needed for flatulence. 09/04/23   Wyn Forster, MD  SUMAtriptan 6 MG/0.5ML SOAJ Inject 6 mg into the skin daily as needed. Inject under the skin at onset of migraine headache. May repeat once in 1 hour if needed. Maximum of 2 injections in  24 hours. Patient not taking: Reported on 10/07/2023 07/15/23   Levie Heritage, DO  witch hazel-glycerin (TUCKS) pad Apply 1 Application topically as needed for hemorrhoids. Patient not taking: Reported on 10/07/2023 09/04/23   Wyn Forster, MD      Allergies    Sulfa antibiotics, Sulfasalazine, Amphetamines, Other, Monosodium glutamate, and Sulfa drugs cross reactors    Review of Systems   Review of Systems  Respiratory:  Positive for cough.   Skin:  Positive for rash.    Physical Exam Updated Vital Signs BP 127/83 (BP Location: Right  Arm)   Pulse 60   Temp 98.8 F (37.1 C) (Oral)   Resp 17   LMP 10/17/2023 (Exact Date)   SpO2 99%  Physical Exam Vitals and nursing note reviewed.  Constitutional:      General: She is not in acute distress.    Appearance: She is not toxic-appearing.     Comments: No acute distress, no hypoxia or increased work of breathing  HENT:     Head: Normocephalic and atraumatic.  Eyes:     General: No scleral icterus.    Conjunctiva/sclera: Conjunctivae normal.  Cardiovascular:     Rate and Rhythm: Normal rate and regular rhythm.     Pulses: Normal pulses.     Heart sounds: Normal heart sounds.  Pulmonary:     Effort: Pulmonary effort is normal. No respiratory distress.     Breath sounds: Normal breath sounds. No stridor. No rhonchi.     Comments: Wheezing in lower lobes  Chest:     Chest wall: No tenderness.  Abdominal:     General: Abdomen is flat. Bowel sounds are normal.     Palpations: Abdomen is soft.     Tenderness: There is no abdominal tenderness.  Skin:    General: Skin is warm and dry.     Findings: No lesion.     Comments: Left labia with very small abscess proximately 1 cm, no obvious area of fluctuance, no surrounding erythema.  Neurological:     General: No focal deficit present.     Mental Status: She is alert and oriented to person, place, and time. Mental status is at baseline.     ED Results / Procedures / Treatments   Labs (all labs ordered are listed, but only abnormal results are displayed) Labs Reviewed  RESP PANEL BY RT-PCR (RSV, FLU A&B, COVID)  [id number]    EKG None  Radiology DG Chest 2 View Result Date: 10/24/2023 CLINICAL DATA:  Cough for 3 days. EXAM: CHEST - 2 VIEW COMPARISON:  September 15, 2023. FINDINGS: The heart size and mediastinal contours are within normal limits. Both lungs are clear. The visualized skeletal structures are unremarkable. IMPRESSION: No active cardiopulmonary disease. Electronically Signed   By: Lupita Raider M.D.    On: 10/24/2023 10:12    Procedures Procedures    Medications Ordered in ED Medications - No data to display  ED Course/ Medical Decision Making/ A&P                                 Medical Decision Making Amount and/or Complexity of Data Reviewed Radiology: ordered.  Risk Prescription drug management.   Lumi L Bieri 27 y.o. presented today for URI like symptoms. Working DDx that I considered at this time includes, but not limited to, viral illness, pharyngitis, mono, sinusitis, electrolyte abnormality, AOM.  R/o DDx: these additional diagnoses  are not consistent with patient's history, presentation, physical exam, labs/imaging findings.  Review of prior external notes: None  Labs:  Respiratory Panel: Neg   Imaging:  Chest X-ray - Neg  Spoke with ED pharmacist at [facility] for antibiotic recommendations.  Discussed location of abscess, duration patient's allergies and concerned about breast-feeding.  They recommend Augmentin.  If this is not starting to improve patient should likely add MRSA coverage however hoping to avoid doxycycline at this time secondary to breast-feeding.  Patient is allergic to bactrim.   Problem List / ED Course / Critical interventions / Medication management  Patient reporting to emergency room with URI-like symptoms.  Does have some wheezing on exam ordering chest x-ray to rule out pneumonia.  Patient hemodynamically stable well-appearing normal oxygen saturation.  Patient was reporting that she has had a boil present for approximately 2 days.  On exam very small and not erythematous no obvious area of fluctuance that would require incision and drainage.  Will treat with outpatient antibiotics and close follow-up with primary care. I ordered medication including Albuterol  for wheezing  Reevaluation of the patient after these medicines showed that the patient improved Patients vitals assessed. Upon arrival patient is hemodynamically stable.  I have  reviewed the patients home medicines and have made adjustments as needed     Plan:  F/u w/ PCP in 2-3d to ensure resolution of sx.  Patient was given return precautions. Patient stable for discharge at this time.  Patient educated on sx and dx and verbalized understanding of plan. Return to ER if new or worsening sx.          Final Clinical Impression(s) / ED Diagnoses Final diagnoses:  Viral URI with cough  Abscess    Rx / DC Orders ED Discharge Orders          Ordered    amoxicillin-clavulanate (AUGMENTIN) 875-125 MG tablet  Every 12 hours        10/24/23 0949    albuterol (VENTOLIN HFA) 108 (90 Base) MCG/ACT inhaler  Every 6 hours PRN        10/24/23 0949              Ranger Petrich, Horald Chestnut, PA-C 10/24/23 1116    Tegeler, Canary Brim, MD 10/24/23 1258

## 2023-10-24 NOTE — Discharge Instructions (Addendum)
You chest x-ray was normal, no sign of pneumonia. Make sure you are staying well-hydrated you can alternate Pedialyte and Gatorade as well.  You can use cough drops or over the counter medications.  Make sure the medications are safe to take while you are breast-feeding.  If you develop fever please take Tylenol.    I have sent antibiotics for abscess.  Please take as prescribed. If symptoms are not improving in 3-4 days, I would suggest returning versus reaching out to PCP for adding MRSA coverage as discussed.  Please follow-up with primary care to ensure resolution of symptoms and return to emergency room with new or worsening symptoms.

## 2023-10-24 NOTE — ED Notes (Signed)
Reviewed discharge instructions with pt. Pt states understanding. Ambulatory at discharge

## 2023-11-01 ENCOUNTER — Emergency Department (HOSPITAL_BASED_OUTPATIENT_CLINIC_OR_DEPARTMENT_OTHER)
Admission: EM | Admit: 2023-11-01 | Discharge: 2023-11-01 | Disposition: A | Discharge status: Home / Self Care | Payer: MEDICAID | Attending: Emergency Medicine | Admitting: Emergency Medicine

## 2023-11-01 ENCOUNTER — Emergency Department (HOSPITAL_BASED_OUTPATIENT_CLINIC_OR_DEPARTMENT_OTHER): Payer: MEDICAID

## 2023-11-01 ENCOUNTER — Inpatient Hospital Stay (HOSPITAL_COMMUNITY)
Admission: AD | Admit: 2023-11-01 | Discharge: 2023-11-01 | Disposition: A | Discharge status: Home / Self Care | Payer: MEDICAID | Attending: Family Medicine | Admitting: Family Medicine

## 2023-11-01 ENCOUNTER — Other Ambulatory Visit: Payer: Self-pay

## 2023-11-01 ENCOUNTER — Encounter (HOSPITAL_BASED_OUTPATIENT_CLINIC_OR_DEPARTMENT_OTHER): Payer: Self-pay | Admitting: Emergency Medicine

## 2023-11-01 ENCOUNTER — Ambulatory Visit (HOSPITAL_BASED_OUTPATIENT_CLINIC_OR_DEPARTMENT_OTHER)
Admission: RE | Admit: 2023-11-01 | Discharge: 2023-11-01 | Discharge status: Home / Self Care | Payer: MEDICAID | Source: Ambulatory Visit | Attending: Family Medicine | Admitting: Family Medicine

## 2023-11-01 DIAGNOSIS — N939 Abnormal uterine and vaginal bleeding, unspecified: Secondary | ICD-10-CM | POA: Insufficient documentation

## 2023-11-01 DIAGNOSIS — R69 Illness, unspecified: Secondary | ICD-10-CM | POA: Insufficient documentation

## 2023-11-01 DIAGNOSIS — F17200 Nicotine dependence, unspecified, uncomplicated: Secondary | ICD-10-CM | POA: Diagnosis not present

## 2023-11-01 DIAGNOSIS — R233 Spontaneous ecchymoses: Secondary | ICD-10-CM | POA: Insufficient documentation

## 2023-11-01 DIAGNOSIS — F502 Bulimia nervosa, unspecified: Secondary | ICD-10-CM | POA: Insufficient documentation

## 2023-11-01 DIAGNOSIS — R109 Unspecified abdominal pain: Secondary | ICD-10-CM | POA: Diagnosis present

## 2023-11-01 DIAGNOSIS — F5089 Other specified eating disorder: Secondary | ICD-10-CM | POA: Insufficient documentation

## 2023-11-01 DIAGNOSIS — G8929 Other chronic pain: Secondary | ICD-10-CM

## 2023-11-01 DIAGNOSIS — R1084 Generalized abdominal pain: Secondary | ICD-10-CM

## 2023-11-01 DIAGNOSIS — R112 Nausea with vomiting, unspecified: Secondary | ICD-10-CM

## 2023-11-01 DIAGNOSIS — R1115 Cyclical vomiting syndrome unrelated to migraine: Secondary | ICD-10-CM

## 2023-11-01 DIAGNOSIS — J181 Lobar pneumonia, unspecified organism: Secondary | ICD-10-CM | POA: Diagnosis not present

## 2023-11-01 DIAGNOSIS — R102 Pelvic and perineal pain: Secondary | ICD-10-CM | POA: Insufficient documentation

## 2023-11-01 DIAGNOSIS — J189 Pneumonia, unspecified organism: Secondary | ICD-10-CM

## 2023-11-01 DIAGNOSIS — M545 Low back pain, unspecified: Secondary | ICD-10-CM

## 2023-11-01 LAB — CBC WITH DIFFERENTIAL/PLATELET
Abs Immature Granulocytes: 0.01 10*3/uL (ref 0.00–0.07)
Basophils Absolute: 0.1 10*3/uL (ref 0.0–0.1)
Basophils Relative: 1 %
Eosinophils Absolute: 0.2 10*3/uL (ref 0.0–0.5)
Eosinophils Relative: 2 %
HCT: 35.4 % — ABNORMAL LOW (ref 36.0–46.0)
Hemoglobin: 11.9 g/dL — ABNORMAL LOW (ref 12.0–15.0)
Immature Granulocytes: 0 %
Lymphocytes Relative: 32 %
Lymphs Abs: 2.9 10*3/uL (ref 0.7–4.0)
MCH: 30.5 pg (ref 26.0–34.0)
MCHC: 33.6 g/dL (ref 30.0–36.0)
MCV: 90.8 fL (ref 80.0–100.0)
Monocytes Absolute: 0.7 10*3/uL (ref 0.1–1.0)
Monocytes Relative: 7 %
Neutro Abs: 5.3 10*3/uL (ref 1.7–7.7)
Neutrophils Relative %: 58 %
Platelets: 355 10*3/uL (ref 150–400)
RBC: 3.9 MIL/uL (ref 3.87–5.11)
RDW: 11.8 % (ref 11.5–15.5)
WBC: 9.2 10*3/uL (ref 4.0–10.5)
nRBC: 0 % (ref 0.0–0.2)

## 2023-11-01 LAB — COMPREHENSIVE METABOLIC PANEL
ALT: 33 U/L (ref 0–44)
AST: 28 U/L (ref 15–41)
Albumin: 3.7 g/dL (ref 3.5–5.0)
Alkaline Phosphatase: 63 U/L (ref 38–126)
Anion gap: 6 (ref 5–15)
BUN: 12 mg/dL (ref 6–20)
CO2: 27 mmol/L (ref 22–32)
Calcium: 8.8 mg/dL — ABNORMAL LOW (ref 8.9–10.3)
Chloride: 107 mmol/L (ref 98–111)
Creatinine, Ser: 0.94 mg/dL (ref 0.44–1.00)
GFR, Estimated: >60 mL/min (ref >60)
Glucose, Bld: 85 mg/dL (ref 70–99)
Potassium: 3.6 mmol/L (ref 3.5–5.1)
Sodium: 140 mmol/L (ref 135–145)
Total Bilirubin: 0.5 mg/dL (ref 0.0–1.2)
Total Protein: 6.5 g/dL (ref 6.5–8.1)

## 2023-11-01 LAB — URINALYSIS, ROUTINE W REFLEX MICROSCOPIC
Bacteria, UA: NONE SEEN
Bilirubin Urine: NEGATIVE
Bilirubin Urine: NEGATIVE
Glucose, UA: NEGATIVE mg/dL
Glucose, UA: NEGATIVE mg/dL
Ketones, ur: NEGATIVE mg/dL
Ketones, ur: NEGATIVE mg/dL
Leukocytes,Ua: NEGATIVE
Leukocytes,Ua: NEGATIVE
Nitrite: NEGATIVE
Nitrite: NEGATIVE
Protein, ur: 100 mg/dL — AB
Protein, ur: 30 mg/dL — AB
RBC / HPF: >50 RBC/hpf (ref 0–5)
Specific Gravity, Urine: 1.028 (ref 1.005–1.030)
Specific Gravity, Urine: 1.025 (ref 1.005–1.030)
Squamous Epithelial / HPF: >50 /[HPF] (ref 0–5)
WBC, UA: >50 WBC/hpf (ref 0–5)
pH: 6 (ref 5.0–8.0)
pH: 7 (ref 5.0–8.0)

## 2023-11-01 LAB — WET PREP, GENITAL
Clue Cells Wet Prep HPF POC: NONE SEEN
Sperm: NONE SEEN
Trich, Wet Prep: NONE SEEN
WBC, Wet Prep HPF POC: <10 (ref <10)
Yeast Wet Prep HPF POC: NONE SEEN

## 2023-11-01 LAB — URINALYSIS, MICROSCOPIC (REFLEX): RBC / HPF: >50 RBC/hpf (ref 0–5)

## 2023-11-01 LAB — POCT PREGNANCY, URINE: Preg Test, Ur: NEGATIVE

## 2023-11-01 LAB — PREGNANCY, URINE: Preg Test, Ur: NEGATIVE

## 2023-11-01 MED ORDER — IOHEXOL 300 MG/ML  SOLN
100.0000 mL | Freq: Once | INTRAMUSCULAR | Status: AC | PRN
  Administered 2023-11-01: 100 mL via INTRAVENOUS

## 2023-11-01 MED ORDER — GADOBUTROL 1 MMOL/ML IV SOLN
7.5000 mL | Freq: Once | INTRAVENOUS | Status: AC | PRN
  Administered 2023-11-01: 7.5 mL via INTRAVENOUS

## 2023-11-01 MED ORDER — ONDANSETRON HCL 4 MG/2ML IJ SOLN
4.0000 mg | Freq: Once | INTRAMUSCULAR | Status: AC
  Administered 2023-11-01: 4 mg via INTRAVENOUS
  Filled 2023-11-01: qty 2

## 2023-11-01 MED ORDER — ONDANSETRON 4 MG PO TBDP: 4.0000 mg | ORAL_TABLET | Freq: Three times a day (TID) | ORAL | 0 refills | Status: DC | PRN

## 2023-11-01 MED ORDER — LORAZEPAM 1 MG PO TABS
1.0000 mg | ORAL_TABLET | Freq: Once | ORAL | Status: AC
  Administered 2023-11-01: 1 mg via ORAL
  Filled 2023-11-01: qty 1

## 2023-11-01 MED ORDER — SODIUM CHLORIDE 0.9 % IV BOLUS
1000.0000 mL | Freq: Once | INTRAVENOUS | Status: AC
  Administered 2023-11-01: 1000 mL via INTRAVENOUS

## 2023-11-01 MED ORDER — MORPHINE SULFATE (PF) 4 MG/ML IV SOLN
4.0000 mg | Freq: Once | INTRAVENOUS | Status: AC
Start: 2023-11-01 — End: 2023-11-01
  Administered 2023-11-01: 4 mg via INTRAVENOUS
  Filled 2023-11-01: qty 1

## 2023-11-01 NOTE — Discharge Instructions (Signed)
 Follow up: Referrals placed for GI, hematology, primary care, and pain management.  Messages sent to [facility] office for lactation appointment and follow up with provider seen today in MAU.

## 2023-11-01 NOTE — MAU Note (Addendum)
.  Misty Jimenez is a 28 y.o. here in MAU reporting: Pt delivered vaginally 09/02/2021. Pt reports excessive vaginal bleeding since birth pt reports it only stopped for 2 days in the last 8 weeks. Pt reports major clots. Pt reports she has told her OBGYN and states she has not been taken seriously. Pt reports she has an MRI scheduled this morning at 1000 Pt reports she has 3 medium size vaginal boils. Pt reports she has had mastitis since 2 weeks PP Onset of complaint: 8 weeks ago Pt reports she has bronchitis and upper respiratory infection, diagnosed 10/24/2023  Pt reports unable to smell and taste since 2 days ago. Pt reports severe vomiting and diarrhea.  Pain score: 8/10 head to toe everything hurts in comparison to birth that was 4.  Vitals:   11/01/23 0657 11/01/23 0700  BP: (!) 140/98 117/61  Pulse: 66   Resp: 12   Temp: 98.1 F (36.7 C)   SpO2: 95%       Lab orders placed from triage:  none

## 2023-11-01 NOTE — ED Triage Notes (Signed)
 Pt reports vaginal bleeding since vaginal birth Nov 6; reports heavy bleeding x 3d; c/o pain all over body; had a scheduled MRI here at 10 am for back pain, but could not tolerate lying on the table

## 2023-11-01 NOTE — Discharge Instructions (Addendum)
 As we discussed, your workup in the ER today was reassuring for acute findings.  The MRI of your lumbar spine was negative for acute findings as well as the CT scan of your abdomen and pelvis.  It did show some infiltrate at the base of your lungs which could be infectious, however you are adequately treated for this with the Augmentin  you are already taking.  I recommend that you complete this course and follow-up closely with your primary care doctor.  The radiologist to read your CT also recommend that you have repeat imaging of your chest in 6 to 12 months to ensure that these findings have gone away.  You can do this through your primary doctor as well.  As far as the bleeding goes, your hemoglobin is stable today.  Please discuss long-term management of this bleeding with your primary care doctor or your OB/GYN at your next scheduled appointment.  I have given you a prescription for Zofran  for you to take as prescribed as needed for management of your nausea and vomiting.  Return if development of any new or worsening symptoms.

## 2023-11-01 NOTE — MAU Provider Note (Signed)
 Chief Complaint: Vaginal Bleeding   Event Date/Time   First Provider Initiated Contact with Patient 11/01/23 0825      SUBJECTIVE HPI: Misty Jimenez is a 28 y.o. G1P1001 who is 8 weeks postpartum following vaginal delivery reporting multiple medical concerns including n/v with history of binging/purging with eating disorder; bruising easily since delivery; low back pain since epidural; chronic back and abdominal pain; and vaginal bleeding off and on since delivery 8 weeks ago.  She also stopped breastfeeding ~ 2 weeks ago because she didn't think it was safe with her antibiotics and is interested in continuing to breastfeed if it is possible.  Pt had a 6 week PP visit and MRI for back/pelvic pain since epidural was ordered, scheduled later today.  Pt does not want to return to [facility] office and wants to be seen in [place].  She requests referrals for pain management to assist with chronic pain, GI for chronic abdominal pain and vomiting.   US  on 12/19 without evidence of POCs.   HPI  Past Medical History:  Diagnosis Date   Abdominal pain, chronic, right lower quadrant 08/03/2013   Abdominal pain, recurrent    With Headache   Acute pyelonephritis 05/07/2016   kidney surgery at 46 months of age   Acute respiratory failure with hypoxia (HCC) 08/24/2023   ADHD (attention deficit hyperactivity disorder)    Allergy    Anxiety    Bipolar affective disorder, current episode manic with psychotic symptoms (HCC) 02/28/2021   I was so depressed after losing my job, I was thinking of cutting myself.     Bulimia 04/17/2023   Cannabis hyperemesis syndrome concurrent with and due to cannabis abuse (HCC) 10/12/2020   Closed fracture of distal end of left radius 06/19/2016   Current smoker 10/28/2018   Depression    Endometriosis    Endometritis 07/23/2017   Family history of adverse reaction to anesthesia    grandmother gets PONV   GE reflux 12/16/2011   Hyperlipidemia    Kidney  disease 05/07/2016   Left breast abscess 12/18/2021   LGSIL on Pap smear of cervix 04/13/2023   03/2023 LSIL - PAP in 1 year     Moderate cannabis use disorder (HCC) 10/28/2018   Obesity 05/07/2016   PCOS (polycystic ovarian syndrome)    Preventative health care 11/04/2016   Reflux, vesicoureteral    Respiratory illness with fever 12/27/2019   Wrist pain, left 11/04/2016   Past Surgical History:  Procedure Laterality Date   KIDNEY SURGERY     As a small child   TYMPANOSTOMY TUBE PLACEMENT     URETER SURGERY     WRIST SURGERY Left 05/2016   10 pins and 2 plates   Social History   Socioeconomic History   Marital status: Married    Spouse name: Not on file   Number of children: Not on file   Years of education: Not on file   Highest education level: Not on file  Occupational History   Not on file  Tobacco Use   Smoking status: Some Days    Current packs/day: 0.00    Average packs/day: 0.3 packs/day for 2.0 years (0.5 ttl pk-yrs)    Types: Cigars, Cigarettes    Start date: 10/27/2013    Last attempt to quit: 10/28/2015    Years since quitting: 8.0   Smokeless tobacco: Former    Types: Chew   Tobacco comments:    Pt declines information  Vaping Use   Vaping  status: Former  Substance and Sexual Activity   Alcohol use: Not Currently    Alcohol/week: 2.0 - 5.0 standard drinks of alcohol    Types: 2 - 5 Cans of beer per week    Comment: 3-4 x week   Drug use: Not Currently    Types: Marijuana   Sexual activity: Yes  Other Topics Concern   Not on file  Social History Narrative   Lives in boyfriend, completing High school   No dietary restrictions has a history of binge eating.. Former smoker, no alcohol or drug use.   Social Drivers of Health   Financial Resource Strain: Medium Risk (11/26/2020)   [facility]   Overall Financial Resource Strain (CARDIA)    Difficulty of Paying Living Expenses:  Somewhat hard  Food Insecurity: No Food Insecurity (09/01/2023)   Hunger Vital Sign    Worried About Running Out of Food in the Last Year: Never true    Ran Out of Food in the Last Year: Never true  Transportation Needs: No Transportation Needs (09/01/2023)   PRAPARE - Administrator, Civil Service (Medical): No    Lack of Transportation (Non-Medical): No  Physical Activity: Insufficiently Active (11/26/2020)   [facility]   Exercise Vital Sign    Days of Exercise per Week: 3 days    Minutes of Exercise per Session: 40 min  Stress: Stress Concern Present (11/26/2020)   [facility]   [employer] of Occupational Health - Occupational Stress Questionnaire    Feeling of Stress : Very much  Social Connections: Unknown (03/07/2022)   [facility] from [facility]   Social Network    Social Network: Not on file  Intimate Partner Violence: Unknown (09/01/2023)   Humiliation, Afraid, Rape, and Kick questionnaire    Fear of Current or Ex-Partner: No    Emotionally Abused: No    Physically Abused: Not on file    Sexually Abused: No   No current facility-administered medications on file prior to encounter.   Current Outpatient Medications on File Prior to Encounter  Medication Sig Dispense Refill   albuterol  (VENTOLIN  HFA) 108 (90 Base) MCG/ACT inhaler Inhale 1-2 puffs into the lungs every 6 (six) hours as needed for wheezing or shortness of breath. 1 each 0   amoxicillin -clavulanate (AUGMENTIN ) 875-125 MG tablet Take 1 tablet by mouth every 12 (twelve) hours. 14 tablet 0   coconut oil OIL Apply 1 Application topically as needed.     cyclobenzaprine  (FLEXERIL ) 5 MG tablet Take 1 tablet (5 mg total) by mouth 3 (three) times daily as needed for muscle spasms. 30 tablet 0   famotidine  (PEPCID ) 20 MG tablet Take 1 tablet (20 mg  total) by mouth 2 (two) times daily. 60 tablet 3   famotidine  (PEPCID ) 40 MG tablet Take 1 tablet (40 mg total) by mouth daily. (Patient not taking: Reported on 10/07/2023) 30 tablet 0   FLUoxetine  (PROZAC ) 20 MG capsule TAKE 1 CAPSULE(20 MG) BY MOUTH DAILY 30 capsule 3   FLUoxetine  (PROZAC ) 40 MG capsule Take 2 capsules (80 mg total) by mouth daily. 120 capsule 1   HYDROcodone -acetaminophen  (NORCO/VICODIN) 5-325 MG tablet Take 1 tablet by mouth every 4 (four) hours as needed. 30 tablet 0   ibuprofen  (ADVIL ) 600 MG tablet TAKE 1 TABLET(600 MG) BY MOUTH EVERY  6 HOURS 30 tablet 0   loratadine  (CLARITIN ) 10 MG tablet Take 1 tablet (10 mg total) by mouth daily. (Patient not taking: Reported on 10/07/2023) 30 tablet 2   ondansetron  (ZOFRAN ) 8 MG tablet Take 1 tablet (8 mg total) by mouth every 8 (eight) hours as needed for nausea or vomiting. (Patient not taking: Reported on 10/07/2023) 20 tablet 1   pantoprazole  (PROTONIX ) 20 MG tablet Take 1 tablet (20 mg total) by mouth daily. (Patient not taking: Reported on 10/07/2023) 30 tablet 0   prenatal vitamin w/FE, FA (NATACHEW) 29-1 MG CHEW chewable tablet Chew 1 tablet by mouth daily at 12 noon. (Patient not taking: Reported on 06/10/2023)     senna-docusate (SENOKOT-S) 8.6-50 MG tablet Take 2 tablets by mouth daily.     simethicone  (MYLICON) 80 MG chewable tablet Chew 1 tablet (80 mg total) by mouth as needed for flatulence.     SUMAtriptan  6 MG/0.5ML SOAJ Inject 6 mg into the skin daily as needed. Inject under the skin at onset of migraine headache. May repeat once in 1 hour if needed. Maximum of 2 injections in 24 hours. (Patient not taking: Reported on 10/07/2023) 15 mL 6   witch hazel-glycerin  (TUCKS) pad Apply 1 Application topically as needed for hemorrhoids. (Patient not taking: Reported on 10/07/2023) 40 each 12   Allergies  Allergen Reactions   Sulfa Antibiotics Hives and Rash   Sulfasalazine Hives   Amphetamines     Reports different parts of  her body melt and causes hallucinations.   Other     Fiberglass cast caused rash and hives   Monosodium Glutamate Nausea And Vomiting, Rash and Other (See Comments)    Headache and dizziness   Sulfa Drugs Cross Reactors Rash    ROS:  Review of Systems  Constitutional:  Negative for chills, fatigue and fever.  Eyes:  Negative for visual disturbance.  Respiratory:  Negative for shortness of breath.   Cardiovascular:  Negative for chest pain.  Gastrointestinal:  Positive for nausea and vomiting. Negative for abdominal pain.  Genitourinary:  Negative for difficulty urinating, dysuria, flank pain, pelvic pain, vaginal bleeding, vaginal discharge and vaginal pain.  Musculoskeletal:  Positive for myalgias.  Skin:  Positive for color change.       Bruising on lower legs    Neurological:  Negative for dizziness and headaches.  Psychiatric/Behavioral: Negative.       I have reviewed patient's Past Medical Hx, Surgical Hx, Family Hx, Social Hx, medications and allergies.   Physical Exam  Patient Vitals for the past 24 hrs:  BP Temp Pulse Resp SpO2  11/01/23 0700 117/61 -- -- -- --  11/01/23 0657 (!) 140/98 98.1 F (36.7 C) 66 12 95 %   Constitutional: Well-developed, well-nourished female in no acute distress.  Cardiovascular: normal rate Respiratory: normal effort GI: Abd soft, non-tender. Pos BS x 4 MS: Extremities nontender, no edema, normal ROM Neurologic: Alert and oriented x 4.  GU: Neg CVAT.  PELVIC EXAM: deferred  LAB RESULTS Results for orders placed or performed during the hospital encounter of 11/01/23 (from the past 24 hours)  Pregnancy, urine POC     Status: None   Collection Time: 11/01/23  7:49 AM  Result Value Ref Range   Preg Test, Ur NEGATIVE NEGATIVE    --/--/O POS (11/05 1000)  IMAGING DG Chest 2 View Result Date: 10/24/2023 CLINICAL DATA:  Cough for 3 days. EXAM: CHEST - 2 VIEW COMPARISON:  September 15, 2023. FINDINGS: The heart size  and  mediastinal contours are within normal limits. Both lungs are clear. The visualized skeletal structures are unremarkable. IMPRESSION: No active cardiopulmonary disease. Electronically Signed   By: Lynwood Landy Raddle M.D.   On: 10/24/2023 10:12   US  PELVIC COMPLETE WITH TRANSVAGINAL Result Date: 10/15/2023 : PROCEDURE: US  PELVIS COMPLETE WITH TRANSVAGINAL HISTORY: Patient is a 28 y/o F with aub after vaginal delivery. Vaginal delivery 5 weeks ago with low back pain since. Breast-feeding. COMPARISON: None. TECHNIQUE: Two-dimensional transabdominal grayscale and color Doppler ultrasound of the pelvis was performed. Transvaginal was performed. FINDINGS: The uterus is anteverted in position and measures 9.6 x 4.5 x 8.3 cm. It demonstrates a heterogeneous echotexture with prominent vessels visualized in the periphery. The endometrium measures 0.9 cm and demonstrates a heterogeneous echotexture, likely representing blood products. There is no definite sonographic evidence of retained products of conception. There is a small amount of fluid visualized within the endocervical canal. The right ovary measures 3.2 x 2.0 x 2.2 cm and demonstrates a normal echotexture. There is normal color Doppler flow. The left ovary measures 3.1 x 1.3 x 1.8 cm and demonstrates a normal echotexture. There is normal color Doppler flow. There is no fluid present within the cul-de-sac. IMPRESSION: 1. Heterogeneous, postpartum uterus with prominent peripheral vessels. 2. Heterogeneous endometrium likely containing blood products. No definite sonographic evidence of retained products conception. 3.  Small amount of endocervical canal fluid. Thank you for allowing us  to assist in the care of this patient. Electronically Signed   By: Lynwood Mains M.D.   On: 10/15/2023 09:06    MAU Management/MDM: Orders Placed This Encounter  Procedures   Urinalysis, Routine w reflex microscopic -Urine, Clean Catch   Ambulatory referral to Gastroenterology    Ambulatory referral to Hematology / Oncology   Ambulatory Referral to Primary Care   Ambulatory referral to Physical Medicine Rehab   Pregnancy, urine POC   Discharge patient    No orders of the defined types were placed in this encounter.   Pt is 8 weeks PP, so offered transfer to ED, or outpatient follow up for concerns.  Pt has MRI today to address back pain.  I placed referrals for hematology for the unusual bruising, pain management, lactation with [facility], primary care (at pt request), GI for chronic abdominal pain and cyclical vomiting and sent a message to establish care with [facility].  ED return precautions reviewed.     ASSESSMENT 1. Cyclical vomiting   2. Binge-purge behavior   3. Bruises easily   4. Multiple medical problems   5. Chronic bilateral low back pain without sciatica   6. Abnormal uterine bleeding (AUB)   7. Postpartum care and examination     PLAN Discharge home Allergies as of 11/01/2023       Reactions   Sulfa Antibiotics Hives, Rash   Sulfasalazine Hives   Amphetamines    Reports different parts of her body melt and causes hallucinations.   Other    Fiberglass cast caused rash and hives   Monosodium Glutamate Nausea And Vomiting, Rash, Other (See Comments)   Headache and dizziness   Sulfa Drugs Cross Reactors Rash        Medication List     STOP taking these medications    misoprostol  200 MCG tablet Commonly known as: CYTOTEC        TAKE these medications    albuterol  108 (90 Base) MCG/ACT inhaler Commonly known as: VENTOLIN  HFA Inhale 1-2 puffs into the lungs every 6 (six) hours  as needed for wheezing or shortness of breath.   amoxicillin -clavulanate 875-125 MG tablet Commonly known as: AUGMENTIN  Take 1 tablet by mouth every 12 (twelve) hours.   coconut oil Oil Apply 1 Application topically as needed.   cyclobenzaprine  5 MG tablet Commonly known as: FLEXERIL  Take 1 tablet (5 mg total) by mouth 3 (three) times daily as needed for  muscle spasms.   famotidine  20 MG tablet Commonly known as: PEPCID  Take 1 tablet (20 mg total) by mouth 2 (two) times daily.   famotidine  40 MG tablet Commonly known as: PEPCID  Take 1 tablet (40 mg total) by mouth daily.   FLUoxetine  40 MG capsule Commonly known as: PROzac  Take 2 capsules (80 mg total) by mouth daily.   FLUoxetine  20 MG capsule Commonly known as: PROZAC  TAKE 1 CAPSULE(20 MG) BY MOUTH DAILY   HYDROcodone -acetaminophen  5-325 MG tablet Commonly known as: NORCO/VICODIN Take 1 tablet by mouth every 4 (four) hours as needed.   ibuprofen  600 MG tablet Commonly known as: ADVIL  TAKE 1 TABLET(600 MG) BY MOUTH EVERY 6 HOURS   loratadine  10 MG tablet Commonly known as: CLARITIN  Take 1 tablet (10 mg total) by mouth daily.   ondansetron  8 MG tablet Commonly known as: Zofran  Take 1 tablet (8 mg total) by mouth every 8 (eight) hours as needed for nausea or vomiting.   pantoprazole  20 MG tablet Commonly known as: Protonix  Take 1 tablet (20 mg total) by mouth daily.   prenatal vitamin w/FE, FA 29-1 MG Chew chewable tablet Chew 1 tablet by mouth daily at 12 noon.   senna-docusate 8.6-50 MG tablet Commonly known as: Senokot-S Take 2 tablets by mouth daily.   simethicone  80 MG chewable tablet Commonly known as: MYLICON Chew 1 tablet (80 mg total) by mouth as needed for flatulence.   SUMAtriptan  6 MG/0.5ML Soaj Inject 6 mg into the skin daily as needed. Inject under the skin at onset of migraine headache. May repeat once in 1 hour if needed. Maximum of 2 injections in 24 hours.   witch hazel-glycerin  pad Commonly known as: TUCKS Apply 1 Application topically as needed for hemorrhoids.        Follow-up Information     [facility] Follow up.   Specialty: Obstetrics and Gynecology Why: The clinic will call you with appointment with Olam Boards, CNM. Contact information: [address] [place]  [place]  [phone]        [facility] Follow up.   Specialty: Obstetrics and Gynecology Contact information: [address] [place]  [address] [phone]                Olam Boards Certified Nurse-Midwife 11/01/2023  8:25 AM

## 2023-11-01 NOTE — ED Notes (Signed)
 ED Provider at bedside.

## 2023-11-01 NOTE — ED Provider Notes (Signed)
 [place] [facility] Provider Note   CSN: [id number] Arrival date & time: 11/01/23  1027     History  Chief Complaint  Patient presents with   Vaginal Bleeding    Misty Jimenez is a 28 y.o. female.  Patient with history of anxiety, bipolar, bulimia, marijuana use, tobacco use, endometriosis, GERD presents today with complaints of vaginal bleeding and abdominal pain. She states that she had an uncomplicated vaginal delivery on 11/06. States that since then she has had persistent bleeding. She notes that her bleeding has been heavier in the past few days. She also notes that in the past few days she has had nausea, vomiting, and abdominal pain. Her pain is generalized throughout her abdomen and does not radiate. Of note, patient also mentions that she had low back pain since her epidural and had an MRI of her lumbar spine this morning for this, she has not gotten results back yet.  Denies any loss of bowel or bladder function or saddle paresthesias.  She is able to walk without issue.  She denies any numbness or tingling in her extremities.  Does note that she had significant abdominal pain when she was having the MRI.  Additionally, of note patient states she was here on 12/28 for URI symptoms as well as a labial abscess.  She has been on Augmentin  for this and has had resolution of this abscess.  She has not completed the course of Augmentin  yet. Denies fevers or chills. She has not called her OB/GYN to schedule an appointment as 'they do not listen to me.' Also mentions she went to [facility] this morning and was told that they stop seeing patients at 6 weeks postpartum and recommended she come here for evaluation given she is now 8 weeks postpartum.   The history is provided by the patient. No language interpreter was used.  Vaginal Bleeding Associated symptoms: abdominal pain and nausea        Home Medications Prior to Admission medications    Medication Sig Start Date End Date Taking? Authorizing Provider  albuterol  (VENTOLIN  HFA) 108 (90 Base) MCG/ACT inhaler Inhale 1-2 puffs into the lungs every 6 (six) hours as needed for wheezing or shortness of breath. 10/24/23   Barrett, Jamie N, PA-C  amoxicillin -clavulanate (AUGMENTIN ) 875-125 MG tablet Take 1 tablet by mouth every 12 (twelve) hours. 10/24/23   Barrett, Jamie N, PA-C  coconut oil OIL Apply 1 Application topically as needed. 09/04/23   Leveque, Alyssa, MD  cyclobenzaprine  (FLEXERIL ) 5 MG tablet Take 1 tablet (5 mg total) by mouth 3 (three) times daily as needed for muscle spasms. 08/28/23   Nicholaus Almarie HERO, MD  famotidine  (PEPCID ) 20 MG tablet Take 1 tablet (20 mg total) by mouth 2 (two) times daily. 07/15/23   Stinson, Jacob J, DO  famotidine  (PEPCID ) 40 MG tablet Take 1 tablet (40 mg total) by mouth daily. Patient not taking: Reported on 10/07/2023 09/15/23   Regino Camie LABOR, CNM  FLUoxetine  (PROZAC ) 20 MG capsule TAKE 1 CAPSULE(20 MG) BY MOUTH DAILY 10/14/23   Stinson, Jacob J, DO  FLUoxetine  (PROZAC ) 40 MG capsule Take 2 capsules (80 mg total) by mouth daily. 09/23/23   Stinson, Jacob J, DO  HYDROcodone -acetaminophen  (NORCO/VICODIN) 5-325 MG tablet Take 1 tablet by mouth every 4 (four) hours as needed. 10/07/23   Stinson, Jacob J, DO  ibuprofen  (ADVIL ) 600 MG tablet TAKE 1 TABLET(600 MG) BY MOUTH EVERY 6 HOURS 10/05/23   Leveque, Alyssa,  MD  loratadine  (CLARITIN ) 10 MG tablet Take 1 tablet (10 mg total) by mouth daily. Patient not taking: Reported on 10/07/2023 09/15/23   Regino Camie LABOR, CNM  ondansetron  (ZOFRAN ) 8 MG tablet Take 1 tablet (8 mg total) by mouth every 8 (eight) hours as needed for nausea or vomiting. Patient not taking: Reported on 10/07/2023 07/12/23   Claudene, [place] , CNM  pantoprazole  (PROTONIX ) 20 MG tablet Take 1 tablet (20 mg total) by mouth daily. Patient not taking: Reported on 10/07/2023 08/20/23   Trudy Earnie CROME, CNM  prenatal vitamin  w/FE, FA (NATACHEW) 29-1 MG CHEW chewable tablet Chew 1 tablet by mouth daily at 12 noon. Patient not taking: Reported on 06/10/2023    [provider]  senna-docusate (SENOKOT-S) 8.6-50 MG tablet Take 2 tablets by mouth daily. 09/04/23   Leveque, Alyssa, MD  simethicone  (MYLICON) 80 MG chewable tablet Chew 1 tablet (80 mg total) by mouth as needed for flatulence. 09/04/23   Leveque, Alyssa, MD  SUMAtriptan  6 MG/0.5ML SOAJ Inject 6 mg into the skin daily as needed. Inject under the skin at onset of migraine headache. May repeat once in 1 hour if needed. Maximum of 2 injections in 24 hours. Patient not taking: Reported on 10/07/2023 07/15/23   Stinson, Jacob J, DO  witch hazel-glycerin  (TUCKS) pad Apply 1 Application topically as needed for hemorrhoids. Patient not taking: Reported on 10/07/2023 09/04/23   Leveque, Alyssa, MD      Allergies    Sulfa antibiotics, Sulfasalazine, Amphetamines, Other, Monosodium glutamate, and Sulfa drugs cross reactors    Review of Systems   Review of Systems  Gastrointestinal:  Positive for abdominal pain, nausea and vomiting.  Genitourinary:  Positive for vaginal bleeding.  All other systems reviewed and are negative.   Physical Exam Updated Vital Signs BP 104/61   Pulse (!) 47   Temp 98.1 F (36.7 C) (Oral)   Resp 20   Ht 5' 7.25 (1.708 m)   Wt 82.6 kg   LMP 10/17/2023 (Exact Date) Comment: pt sts she has had vaginal bleeding since giving birth 09/02/23  SpO2 99%   BMI 28.29 kg/m  Physical Exam Vitals and nursing note reviewed. Exam conducted with a chaperone present.  Constitutional:      General: She is not in acute distress.    Appearance: Normal appearance. She is normal weight. She is not ill-appearing, toxic-appearing or diaphoretic.  HENT:     Head: Normocephalic and atraumatic.  Cardiovascular:     Rate and Rhythm: Normal rate and regular rhythm.     Heart sounds: Normal heart sounds.  Pulmonary:     Effort: Pulmonary effort is  normal. No respiratory distress.     Breath sounds: Normal breath sounds.  Abdominal:     General: Abdomen is flat.     Palpations: Abdomen is soft.     Tenderness: There is abdominal tenderness. There is no guarding or rebound.     Comments: Generalized abdominal TTP throughout without rebound or guarding  Genitourinary:    Comments: Trace dark blood within the vagina without abscess or discharge. No CMT or adnexal tenderness. No signs of vaginal trauma. Musculoskeletal:        General: Normal range of motion.     Cervical back: Normal range of motion.     Comments: No mildline tenderness to palpation of the cervical or thoracic spine. Mild TTP noted to the lumbar spine and surrounding perispinous muscles. 5/5 strength and sensation intact bilateral lower extremities.  Patient  observed to be ambulatory with steady gait. DP and PT pulses intact and 2+.   Skin:    General: Skin is warm and dry.  Neurological:     General: No focal deficit present.     Mental Status: She is alert and oriented to person, place, and time.  Psychiatric:        Mood and Affect: Mood normal.        Behavior: Behavior normal.     ED Results / Procedures / Treatments   Labs (all labs ordered are listed, but only abnormal results are displayed) Labs Reviewed  CBC WITH DIFFERENTIAL/PLATELET - Abnormal; Notable for the following components:      Result Value   Hemoglobin 11.9 (*)    HCT 35.4 (*)    All other components within normal limits  COMPREHENSIVE METABOLIC PANEL - Abnormal; Notable for the following components:   Calcium 8.8 (*)    All other components within normal limits  URINALYSIS, ROUTINE W REFLEX MICROSCOPIC - Abnormal; Notable for the following components:   APPearance CLOUDY (*)    Hgb urine dipstick LARGE (*)    Protein, ur 30 (*)    All other components within normal limits  URINALYSIS, MICROSCOPIC (REFLEX) - Abnormal; Notable for the following components:   Bacteria, UA MANY (*)     All other components within normal limits  WET PREP, GENITAL  PREGNANCY, URINE  GC/CHLAMYDIA PROBE AMP ([place]) NOT AT [facility]    EKG None  Radiology CT ABDOMEN PELVIS W CONTRAST Result Date: 11/01/2023 CLINICAL DATA:  Abdominal pain, acute, nonlocalized EXAM: CT ABDOMEN AND PELVIS WITH CONTRAST TECHNIQUE: Multidetector CT imaging of the abdomen and pelvis was performed using the standard protocol following bolus administration of intravenous contrast. RADIATION DOSE REDUCTION: This exam was performed according to the departmental dose-optimization program which includes automated exposure control, adjustment of the mA and/or kV according to patient size and/or use of iterative reconstruction technique. CONTRAST:  OMNIPAQUE  IOHEXOL  300 MG/ML  SOLN COMPARISON:  07/28/2018 FINDINGS: Lower chest: No pleural or pericardial effusion. Focal peripheral ground-glass opacity in posterior basal segment left lower lobe, new since previous, and a smaller 7 mm ground-glass pleural based opacity in the inferolateral right middle lobe image 9. Hepatobiliary: No focal liver abnormality is seen. No gallstones, gallbladder wall thickening, or biliary dilatation. Pancreas: Unremarkable. No pancreatic ductal dilatation or surrounding inflammatory changes. Spleen: Normal in size without focal abnormality. Adrenals/Urinary Tract: Adrenal glands are unremarkable. Kidneys are normal, without renal calculi, focal lesion, or hydronephrosis. Bladder is unremarkable. Stomach/Bowel: Stomach partially distended, without acute finding. Small bowel decompressed. Normal appendix. The colon is partially distended. 2 small diverticula from the proximal sigmoid segment. No adjacent inflammatory change. Vascular/Lymphatic: No significant vascular findings are present. No enlarged abdominal or pelvic lymph nodes. Reproductive: Uterus and bilateral adnexa are unremarkable. Other: No ascites.  No free air. Musculoskeletal: No acute or  significant osseous findings. IMPRESSION: 1. Small ground-glass opacities in the lung bases, possibly infectious/inflammatory. Initial follow-up with CT at 6-12 months is recommended to confirm persistence. If persistent, repeat CT is recommended every 2 years until 5 years of stability has been established. This recommendation follows the consensus statement: Guidelines for Management of Incidental Pulmonary Nodules Detected on CT Images:From the Fleischner Society 2017; published online before print (10.1148/[id number]). 2. Sigmoid diverticulosis. Electronically Signed   By: JONETTA Faes M.D.   On: 11/01/2023 18:45   MR Lumbar Spine W Wo Contrast Result Date: 11/01/2023 CLINICAL DATA:  Spine  fracture, lumbar, traumatic EXAM: MRI LUMBAR SPINE WITHOUT AND WITH CONTRAST TECHNIQUE: Multiplanar and multiecho pulse sequences of the lumbar spine were obtained without and with intravenous contrast. CONTRAST:  7.5mL GADAVIST  GADOBUTROL  1 MMOL/ML IV SOLN COMPARISON:  None Available. FINDINGS: Segmentation:  Standard. Alignment:  Trace retrolisthesis of L2 on L3 Vertebrae:  No fracture, evidence of discitis, or bone lesion. Conus medullaris and cauda equina: Conus extends to the T12-L1 disc space level. Conus and cauda equina appear normal. Paraspinal and other soft tissues: Negative. Disc levels: T12-L1: Only imaged in the sagittal plane. No evidence high-grade spinal canal or neural foraminal stenosis L1-L2: Unremarkable L2-L3: Unremarkable L3-L4: Mild bilateral facet degenerative change. Minimal disc bulge. No spinal canal narrowing. No neural foraminal narrowing. L4-L5: Mild bilateral facet degenerative change. No significant disc bulge. No spinal canal narrowing. No neural foraminal narrowing. L5-S1: Mild bilateral facet degenerative change. No significant disc bulge. No spinal canal narrowing. No neural foraminal narrowing. IMPRESSION: 1. No acute abnormality. 2. No evidence of high-grade spinal canal or neural  foraminal stenosis. Electronically Signed   By: Lyndall Gore M.D.   On: 11/01/2023 15:42    Procedures Procedures    Medications Ordered in ED Medications  LORazepam  (ATIVAN ) tablet 1 mg (1 mg Oral Given 11/01/23 1558)  morphine  (PF) 4 MG/ML injection 4 mg (4 mg Intravenous Given 11/01/23 1733)  sodium chloride  0.9 % bolus 1,000 mL (1,000 mLs Intravenous New Bag/Given 11/01/23 1732)  ondansetron  (ZOFRAN ) injection 4 mg (4 mg Intravenous Given 11/01/23 1732)  iohexol  (OMNIPAQUE ) 300 MG/ML solution 100 mL (100 mLs Intravenous Contrast Given 11/01/23 1823)    ED Course/ Medical Decision Making/ A&P                                 Medical Decision Making Amount and/or Complexity of Data Reviewed Labs: ordered. Radiology: ordered.  Risk Prescription drug management.   This patient is a 28 y.o. female who presents to the ED for concern of vaginal bleeding, abdominal pain, this involves an extensive number of treatment options, and is a complaint that carries with it a high risk of complications and morbidity. The emergent differential diagnosis prior to evaluation includes, but is not limited to,  Abnormal uterine bleeding, threatened miscarriage, incomplete miscarriage, normal bleeding from an early trimester pregnancy, ectopic pregnancy, vaginal/cervical trauma, subchorionic hemorrhage/hematoma, normal postpartum bleeding, AAA, gastroenteritis, appendicitis, Bowel obstruction, Bowel perforation. Gastroparesis, DKA, Hernia, Inflammatory bowel disease, mesenteric ischemia, pancreatitis, peritonitis SBP, volvulus.  This is not an exhaustive differential.   Past Medical History / Co-morbidities / Social History:  has a past medical history of Abdominal pain, chronic, right lower quadrant (08/03/2013), Abdominal pain, recurrent, Acute pyelonephritis (05/07/2016), Acute respiratory failure with hypoxia (HCC) (08/24/2023), ADHD (attention deficit hyperactivity disorder), Allergy, Anxiety, Bipolar  affective disorder, current episode manic with psychotic symptoms (HCC) (02/28/2021), Bulimia (04/17/2023), Cannabis hyperemesis syndrome concurrent with and due to cannabis abuse (HCC) (10/12/2020), Closed fracture of distal end of left radius (06/19/2016), Current smoker (10/28/2018), Depression, Endometriosis, Endometritis (07/23/2017), Family history of adverse reaction to anesthesia, GE reflux (12/16/2011), Hyperlipidemia, Kidney disease (05/07/2016), Left breast abscess (12/18/2021), LGSIL on Pap smear of cervix (04/13/2023), Moderate cannabis use disorder (HCC) (10/28/2018), Obesity (05/07/2016), PCOS (polycystic ovarian syndrome), Preventative health care (11/04/2016), Reflux, vesicoureteral, Respiratory illness with fever (12/27/2019), and Wrist pain, left (11/04/2016).  Additional history: Chart reviewed. Pertinent results include: patient with uncomplicated vaginal delivery on 11/06.   Physical Exam: Physical  exam performed. The pertinent findings include: per above, generalized abdominal pain without rebound or guarding.  Mild dark bleeding within signs of trauma. No discharge or abscess. No CMT or adnexal tenderness. Midline back pain without neurologic deficits  Lab Tests: I ordered, and personally interpreted labs.  The pertinent results include:  hgb 11.9 (down from 13.3 3 weeks ago), UA with bleeding, noninfectious. Upreg negative, wet prep negative   Imaging Studies: I ordered imaging studies including CT abdomen pelvis. I independently visualized and interpreted imaging which showed   1. Small ground-glass opacities in the lung bases, possibly infectious/inflammatory.  2. Sigmoid diverticulosis.  I agree with the radiologist interpretation.  Patient also had an outpatient lumbar spine MRI with and without contrast this morning.  I was able to call radiology and get these results expedited.  These have resulted and reveal  1. No acute abnormality. 2. No evidence of high-grade  spinal canal or neural foraminal stenosis.   Cardiac Monitoring:  The patient was maintained on a cardiac monitor.  Cardiac monitor showed an underlying rhythm of: sinus bradycardia. I agree with this interpretation.   Medications: I ordered medication including ativan , morphine , zofran , fluids  for pain, anxiety, dehydration, nausea/vomiting. Reevaluation of the patient after these medicines showed that the patient improved. I have reviewed the patients home medicines and have made adjustments as needed.  Disposition: After consideration of the diagnostic results and the patients response to treatment, I feel that emergency department workup does not suggest an emergent condition requiring admission or immediate intervention beyond what has been performed at this time. The plan is: discharge with zofran  for residual nausea/vomiting with close outpatient follow-up and return precautions.  After fluids and medication, patient is feeling better and ready to go home.  She does have some CT findings at the base of her lung that could potentially be infectious given her symptoms, however she is in the middle of a course of Augmentin  which should adequately treat this and her symptoms are mild.  No indication for adding additional antibiotics at this time.  I do recommend that she follow-up closely with her OB/GYN to discuss long-term management of her bleeding as well as her back pain as the lumbar spine MRI was ordered by her OB/GYN.  Her hemoglobin is stable today and exam shows only trace amounts of dark blood within her vagina.  She is able to eat and drink without any residual nausea or vomiting.  Evaluation and diagnostic testing in the emergency department does not suggest an emergent condition requiring admission or immediate intervention beyond what has been performed at this time.  Plan for discharge with close PCP follow-up.  Patient is understanding and amenable with plan, educated on red flag  symptoms that would prompt immediate return.  Patient discharged in stable condition.   I discussed this case with my attending physician Dr. Jerral who cosigned this note including patient's presenting symptoms, physical exam, and planned diagnostics and interventions. Attending physician stated agreement with plan or made changes to plan which were implemented.    Final Clinical Impression(s) / ED Diagnoses Final diagnoses:  Vaginal bleeding  Generalized abdominal pain  Pneumonia of lower lobe due to infectious organism, unspecified laterality  Nausea and vomiting, unspecified vomiting type  Chronic midline low back pain without sciatica    Rx / DC Orders ED Discharge Orders          Ordered    ondansetron  (ZOFRAN -ODT) 4 MG disintegrating tablet  Every 8 hours PRN  11/01/23 1930          An After Visit Summary was printed and given to the patient.     Tanyia Grabbe A, PA-C 11/01/23 1931    Jerral Meth, MD 11/01/23 2019

## 2023-11-01 NOTE — ED Notes (Signed)
 Patient transported to CT

## 2023-11-02 LAB — GC/CHLAMYDIA PROBE AMP (~~LOC~~) NOT AT ARMC
Chlamydia: NEGATIVE
Comment: NEGATIVE
Comment: NORMAL
Neisseria Gonorrhea: NEGATIVE

## 2023-11-03 ENCOUNTER — Encounter: Payer: Self-pay | Admitting: Advanced Practice Midwife

## 2023-11-03 ENCOUNTER — Telehealth: Payer: Self-pay

## 2023-11-03 NOTE — Telephone Encounter (Signed)
 Patient called verbally upset with the care our office has provided. Patient states she has major problems and the hospital has dismissed her. I asked the patient what problems she was having and patient screamed I am shitting green and having vaginal bleeding and y'all do not care. Patient also reports she has bruises all over her legs and has done nothing to cause the bruises. Reassured patient labs were in normal range. Patient still upset and states she is throwing up strings with blood informed patient according to ED note she was diagnosed with sigmoid diverticulosis and could potentially be related. Patient advised to report back to emergency room for further evaluation concerning her symptoms since patient does not have a PCP. Informed patient I feel this is GI related.

## 2023-11-05 ENCOUNTER — Ambulatory Visit (HOSPITAL_COMMUNITY)
Admission: EM | Admit: 2023-11-05 | Discharge: 2023-11-06 | Disposition: A | Discharge status: Other Acute Inpatient Hospital | Payer: MEDICAID | Attending: Psychiatry | Admitting: Psychiatry

## 2023-11-05 DIAGNOSIS — Z560 Unemployment, unspecified: Secondary | ICD-10-CM | POA: Insufficient documentation

## 2023-11-05 DIAGNOSIS — F1190 Opioid use, unspecified, uncomplicated: Secondary | ICD-10-CM

## 2023-11-05 DIAGNOSIS — F411 Generalized anxiety disorder: Secondary | ICD-10-CM | POA: Insufficient documentation

## 2023-11-05 DIAGNOSIS — F3162 Bipolar disorder, current episode mixed, moderate: Secondary | ICD-10-CM | POA: Diagnosis not present

## 2023-11-05 DIAGNOSIS — F1290 Cannabis use, unspecified, uncomplicated: Secondary | ICD-10-CM | POA: Insufficient documentation

## 2023-11-05 DIAGNOSIS — F1110 Opioid abuse, uncomplicated: Secondary | ICD-10-CM | POA: Insufficient documentation

## 2023-11-05 DIAGNOSIS — F3181 Bipolar II disorder: Secondary | ICD-10-CM | POA: Insufficient documentation

## 2023-11-05 DIAGNOSIS — F603 Borderline personality disorder: Secondary | ICD-10-CM | POA: Insufficient documentation

## 2023-11-05 DIAGNOSIS — F17210 Nicotine dependence, cigarettes, uncomplicated: Secondary | ICD-10-CM | POA: Insufficient documentation

## 2023-11-05 DIAGNOSIS — O99345 Other mental disorders complicating the puerperium: Secondary | ICD-10-CM | POA: Diagnosis not present

## 2023-11-05 DIAGNOSIS — F1491 Cocaine use, unspecified, in remission: Secondary | ICD-10-CM | POA: Insufficient documentation

## 2023-11-05 DIAGNOSIS — F17200 Nicotine dependence, unspecified, uncomplicated: Secondary | ICD-10-CM | POA: Insufficient documentation

## 2023-11-05 DIAGNOSIS — R45851 Suicidal ideations: Secondary | ICD-10-CM | POA: Insufficient documentation

## 2023-11-05 DIAGNOSIS — F1521 Other stimulant dependence, in remission: Secondary | ICD-10-CM

## 2023-11-05 DIAGNOSIS — F53 Postpartum depression: Secondary | ICD-10-CM | POA: Diagnosis not present

## 2023-11-05 DIAGNOSIS — F1121 Opioid dependence, in remission: Secondary | ICD-10-CM

## 2023-11-05 LAB — URINALYSIS, ROUTINE W REFLEX MICROSCOPIC
Bilirubin Urine: NEGATIVE
Glucose, UA: NEGATIVE mg/dL
Ketones, ur: NEGATIVE mg/dL
Leukocytes,Ua: NEGATIVE
Nitrite: NEGATIVE
Protein, ur: NEGATIVE mg/dL
Specific Gravity, Urine: 1.014 (ref 1.005–1.030)
pH: 5 (ref 5.0–8.0)

## 2023-11-05 LAB — TSH: TSH: 2.037 u[IU]/mL (ref 0.350–4.500)

## 2023-11-05 LAB — COMPREHENSIVE METABOLIC PANEL
ALT: 19 U/L (ref 0–44)
AST: 19 U/L (ref 15–41)
Albumin: 3.4 g/dL — ABNORMAL LOW (ref 3.5–5.0)
Alkaline Phosphatase: 61 U/L (ref 38–126)
Anion gap: 6 (ref 5–15)
BUN: 9 mg/dL (ref 6–20)
CO2: 27 mmol/L (ref 22–32)
Calcium: 8.8 mg/dL — ABNORMAL LOW (ref 8.9–10.3)
Chloride: 104 mmol/L (ref 98–111)
Creatinine, Ser: 0.85 mg/dL (ref 0.44–1.00)
GFR, Estimated: >60 mL/min (ref >60)
Glucose, Bld: 70 mg/dL (ref 70–99)
Potassium: 4.1 mmol/L (ref 3.5–5.1)
Sodium: 137 mmol/L (ref 135–145)
Total Bilirubin: 0.6 mg/dL (ref 0.0–1.2)
Total Protein: 5.5 g/dL — ABNORMAL LOW (ref 6.5–8.1)

## 2023-11-05 LAB — CBC WITH DIFFERENTIAL/PLATELET
Abs Immature Granulocytes: 0.04 10*3/uL (ref 0.00–0.07)
Basophils Absolute: 0.1 10*3/uL (ref 0.0–0.1)
Basophils Relative: 1 %
Eosinophils Absolute: 0.1 10*3/uL (ref 0.0–0.5)
Eosinophils Relative: 1 %
HCT: 35.6 % — ABNORMAL LOW (ref 36.0–46.0)
Hemoglobin: 11.9 g/dL — ABNORMAL LOW (ref 12.0–15.0)
Immature Granulocytes: 0 %
Lymphocytes Relative: 20 %
Lymphs Abs: 2.5 10*3/uL (ref 0.7–4.0)
MCH: 31.3 pg (ref 26.0–34.0)
MCHC: 33.4 g/dL (ref 30.0–36.0)
MCV: 93.7 fL (ref 80.0–100.0)
Monocytes Absolute: 0.6 10*3/uL (ref 0.1–1.0)
Monocytes Relative: 4 %
Neutro Abs: 9.5 10*3/uL — ABNORMAL HIGH (ref 1.7–7.7)
Neutrophils Relative %: 74 %
Platelets: 372 10*3/uL (ref 150–400)
RBC: 3.8 MIL/uL — ABNORMAL LOW (ref 3.87–5.11)
RDW: 11.9 % (ref 11.5–15.5)
WBC: 12.8 10*3/uL — ABNORMAL HIGH (ref 4.0–10.5)
nRBC: 0 % (ref 0.0–0.2)

## 2023-11-05 LAB — ETHANOL: Alcohol, Ethyl (B): <10 mg/dL (ref <10)

## 2023-11-05 LAB — LIPID PANEL
Cholesterol: 161 mg/dL (ref 0–200)
HDL: 47 mg/dL (ref >40)
LDL Cholesterol: 95 mg/dL (ref 0–99)
Total CHOL/HDL Ratio: 3.4 {ratio}
Triglycerides: 95 mg/dL (ref <150)
VLDL: 19 mg/dL (ref 0–40)

## 2023-11-05 LAB — SARS CORONAVIRUS 2 BY RT PCR: SARS Coronavirus 2 by RT PCR: NEGATIVE

## 2023-11-05 LAB — POCT URINE DRUG SCREEN - MANUAL ENTRY (I-SCREEN)
POC Amphetamine UR: NOT DETECTED
POC Buprenorphine (BUP): NOT DETECTED
POC Cocaine UR: NOT DETECTED
POC Marijuana UR: POSITIVE — AB
POC Methadone UR: NOT DETECTED
POC Methamphetamine UR: NOT DETECTED
POC Morphine: NOT DETECTED
POC Oxazepam (BZO): NOT DETECTED
POC Oxycodone UR: NOT DETECTED
POC Secobarbital (BAR): NOT DETECTED

## 2023-11-05 LAB — HEMOGLOBIN A1C
Hgb A1c MFr Bld: 4.4 % — ABNORMAL LOW (ref 4.8–5.6)
Mean Plasma Glucose: 79.58 mg/dL

## 2023-11-05 LAB — POC URINE PREG, ED: Preg Test, Ur: NEGATIVE

## 2023-11-05 MED ORDER — NICOTINE 21 MG/24HR TD PT24
21.0000 mg | MEDICATED_PATCH | Freq: Every day | TRANSDERMAL | Status: DC
  Administered 2023-11-06: 21 mg via TRANSDERMAL
  Filled 2023-11-05: qty 1

## 2023-11-05 MED ORDER — FLUOXETINE HCL 20 MG PO CAPS
80.0000 mg | ORAL_CAPSULE | Freq: Every day | ORAL | Status: DC
  Filled 2023-11-05 (×2): qty 4

## 2023-11-05 MED ORDER — HYDROXYZINE HCL 25 MG PO TABS
50.0000 mg | ORAL_TABLET | Freq: Three times a day (TID) | ORAL | Status: DC | PRN
  Administered 2023-11-06: 50 mg via ORAL
  Filled 2023-11-05: qty 2

## 2023-11-05 MED ORDER — TRAZODONE HCL 100 MG PO TABS: 100.0000 mg | ORAL_TABLET | Freq: Every evening | ORAL | Status: DC | PRN

## 2023-11-05 MED ORDER — ACETAMINOPHEN 325 MG PO TABS
650.0000 mg | ORAL_TABLET | Freq: Four times a day (QID) | ORAL | Status: DC | PRN
  Administered 2023-11-05: 650 mg via ORAL
  Filled 2023-11-05: qty 2

## 2023-11-05 MED ORDER — MAGNESIUM HYDROXIDE 400 MG/5ML PO SUSP: 30.0000 mL | Freq: Every day | ORAL | Status: DC | PRN

## 2023-11-05 MED ORDER — QUETIAPINE FUMARATE 50 MG PO TABS
150.0000 mg | ORAL_TABLET | Freq: Every day | ORAL | Status: DC
  Administered 2023-11-05: 150 mg via ORAL
  Filled 2023-11-05: qty 1

## 2023-11-05 MED ORDER — ALUM & MAG HYDROXIDE-SIMETH 200-200-20 MG/5ML PO SUSP: 30.0000 mL | ORAL | Status: DC | PRN

## 2023-11-05 NOTE — ED Notes (Signed)
 Pt laying on recliner calm and cooperative pt states she is still having SI thoughts with no plan she states she is sad alert and orient x 3 will continue to monitor for safety. RN Linton Rump made aware of pt thoughts
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
## 2023-11-05 NOTE — ED Notes (Addendum)
 Patient arrived today at [facility] voluntary and reported feeling suicidal without a plan. She reports past history of SI with plan by cutting herself, but has not done self harm in 69yrs.  Reports of PTSD from past relationships. Reported she is postpartum depressed and overwhelmed after having given birth 09/02/2023, while being unemployed. She is also Breastfeeding and needing to express. Supplies on hand provided as requested. She is on Prozac  80mg  po once daily, and is adherent but says its not working. Denies HI/AVH. Reports feeling safe here at [facility] and wants long term treatment. She is tearful,calm and cooperative on approach.and able to follow directions from staff.  Skin check done no contraband. Contracted for safety. Meal offered and accepted. Acclimated to unit and has no offered concerns. Behavior is controlled and will continue to monitor and report changes noted.

## 2023-11-05 NOTE — ED Provider Notes (Signed)
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 Performed at [facility], [address], [place], [place] [address]    Prothrombin Time 09/15/2023 13.3  11.4 - 15.2 seconds Final   INR 09/15/2023 1.0  0.8 - 1.2 Final   Comment: (NOTE) INR goal varies based on device and disease states. Performed at [facility], [address], [place], [place] [address]   Admission on 09/01/2023, Discharged on 09/04/2023  Component Date Value Ref Range Status   GBS 08/22/2023 Positive   Final   WBC 09/01/2023 11.7 (H)  4.0 - 10.5 K/uL Final   RBC  09/01/2023 4.25  3.87 - 5.11 MIL/uL Final   Hemoglobin 09/01/2023 13.3  12.0 - 15.0 g/dL Final   HCT 88/94/7975 38.6  36.0 - 46.0 % Final   MCV 09/01/2023 90.8  80.0 - 100.0 fL Final   MCH 09/01/2023 31.3  26.0 - 34.0 pg Final   MCHC 09/01/2023 34.5  30.0 - 36.0 g/dL Final   RDW 88/94/7975 12.9  11.5 - 15.5 % Final   Platelets 09/01/2023 412 (H)  150 - 400 K/uL Final   nRBC 09/01/2023 0.0  0.0 - 0.2 % Final   Performed at [facility], [address], [place], [place] [address]   ABO/RH(D) 09/01/2023 O POS   Final   Antibody Screen 09/01/2023 NEG   Final   Sample Expiration 09/01/2023    Final                   Value:09/04/2023,2359 Performed at [facility], [address], [place], [place] [address]    RPR Ser Ql 09/01/2023 NON REACTIVE  NON REACTIVE Final   Performed at [facility], [address], [place], [place] [address]   Opiates 09/01/2023 NONE DETECTED  NONE DETECTED Final   Cocaine 09/01/2023 NONE DETECTED  NONE DETECTED Final   Benzodiazepines 09/01/2023 NONE DETECTED  NONE DETECTED Final   Amphetamines 09/01/2023 NONE DETECTED  NONE DETECTED Final   Tetrahydrocannabinol 09/01/2023 POSITIVE (A)  NONE DETECTED Final   Barbiturates 09/01/2023 NONE DETECTED  NONE DETECTED Final   Comment: (NOTE) DRUG SCREEN FOR MEDICAL PURPOSES ONLY.  IF CONFIRMATION IS NEEDED FOR ANY PURPOSE, NOTIFY LAB WITHIN 5 DAYS.  LOWEST DETECTABLE LIMITS FOR URINE DRUG SCREEN Drug Class                     Cutoff (ng/mL) Amphetamine and metabolites    1000 Barbiturate and metabolites    200 Benzodiazepine                 200 Opiates and metabolites        300 Cocaine and metabolites        300 THC  50 Performed at [facility], [address], [place], [place] [address]    Placenta donation bld collect 09/03/2023 Collected by Laboratory   Final   Performed at [facility], [address], [place], [place] [address]  Admission on  08/31/2023, Discharged on 08/31/2023  Component Date Value Ref Range Status   POCT [place] Test 08/31/2023 Negative = intact amniotic membranes   Final   Rom Plus 08/31/2023 NEGATIVE   Final   Performed at [facility], [address] [place] [address], [place], [place] [address]  Admission on 08/28/2023, Discharged on 08/28/2023  Component Date Value Ref Range Status   POCT [facility] Test 08/28/2023 Negative = intact amniotic membranes   Final   WBC 08/28/2023 11.8 (H)  4.0 - 10.5 K/uL Final   RBC 08/28/2023 4.08  3.87 - 5.11 MIL/uL Final   Hemoglobin 08/28/2023 12.6  12.0 - 15.0 g/dL Final   HCT 88/98/7975 36.7  36.0 - 46.0 % Final   MCV 08/28/2023 90.0  80.0 - 100.0 fL Final   MCH 08/28/2023 30.9  26.0 - 34.0 pg Final   MCHC 08/28/2023 34.3  30.0 - 36.0 g/dL Final   RDW 88/98/7975 12.8  11.5 - 15.5 % Final   Platelets 08/28/2023 363  150 - 400 K/uL Final   nRBC 08/28/2023 0.0  0.0 - 0.2 % Final   Performed at [facility], [address], [place], [place] [address]   Sodium 08/28/2023 134 (L)  135 - 145 mmol/L Final   Potassium 08/28/2023 3.7  3.5 - 5.1 mmol/L Final   Chloride 08/28/2023 105  98 - 111 mmol/L Final   CO2 08/28/2023 22  22 - 32 mmol/L Final   Glucose, Bld 08/28/2023 83  70 - 99 mg/dL Final   Glucose reference range applies only to samples taken after fasting for at least 8 hours.   BUN 08/28/2023 6  6 - 20 mg/dL Final   Creatinine, Ser 08/28/2023 0.72  0.44 - 1.00 mg/dL Final   Calcium 88/98/7975 8.7 (L)  8.9 - 10.3 mg/dL Final   Total Protein 88/98/7975 5.6 (L)  6.5 - 8.1 g/dL Final   Albumin 88/98/7975 2.6 (L)  3.5 - 5.0 g/dL Final   AST 88/98/7975 49 (H)  15 - 41 U/L Final   ALT 08/28/2023 37  0 - 44 U/L Final   Alkaline Phosphatase 08/28/2023 194 (H)  38 - 126 U/L Final   Total Bilirubin 08/28/2023 0.5  0.3 - 1.2 mg/dL Final   GFR, Estimated 08/28/2023 >60  >60 mL/min Final   Comment: (NOTE) Calculated using the CKD-EPI Creatinine Equation (2021)    Anion gap 08/28/2023  7  5 - 15 Final   Performed at [facility], [address] [place], [place] [address]   Creatinine, Urine 08/28/2023 225  mg/dL Final   Total Protein, Urine 08/28/2023 17  mg/dL Final   NO NORMAL RANGE ESTABLISHED FOR THIS TEST   Protein Creatinine Ratio 08/28/2023 0.08  0.00 - 0.15 mg/mg[Cre] Final   Performed at [facility], [address] [place], [place] [address]   Color, Urine 08/28/2023 AMBER (A)  YELLOW Final   BIOCHEMICALS MAY BE AFFECTED BY COLOR   APPearance 08/28/2023 HAZY (A)  CLEAR Final   Specific Gravity, Urine 08/28/2023 1.021  1.005 - 1.030 Final   pH 08/28/2023 7.0  5.0 - 8.0 Final   Glucose, UA 08/28/2023 NEGATIVE  NEGATIVE mg/dL Final   Hgb urine  dipstick 08/28/2023 NEGATIVE  NEGATIVE Final   Bilirubin Urine 08/28/2023 NEGATIVE  NEGATIVE Final   Ketones, ur 08/28/2023 NEGATIVE  NEGATIVE mg/dL Final   Protein, ur 88/98/7975 30 (A)  NEGATIVE mg/dL Final   Nitrite 88/98/7975 NEGATIVE  NEGATIVE Final   Leukocytes,Ua 08/28/2023 NEGATIVE  NEGATIVE Final   RBC / HPF 08/28/2023 0-5  0 - 5 RBC/hpf Final   WBC, UA 08/28/2023 0-5  0 - 5 WBC/hpf Final   Bacteria, UA 08/28/2023 RARE (A)  NONE SEEN Final   Squamous Epithelial / HPF 08/28/2023 21-50  0 - 5 /HPF Final   Mucus 08/28/2023 PRESENT   Final   Non Squamous Epithelial 08/28/2023 0-5 (A)  NONE SEEN Final   Performed at [facility], [address] [place], [place] [address]   Rom Plus 08/28/2023 NEGATIVE   Final   Performed at [facility], [address], [place], [place] [address]  Admission on 08/22/2023, Discharged on 08/22/2023  Component Date Value Ref Range Status   POCT [facility] Test 08/22/2023 Negative = intact amniotic membranes   Final   Yeast Wet Prep HPF POC 08/22/2023 NONE SEEN  NONE SEEN Final   Trich, Wet Prep 08/22/2023 NONE SEEN  NONE SEEN Final   Clue Cells Wet Prep HPF POC 08/22/2023 NONE SEEN  NONE SEEN Final   WBC, Wet Prep HPF POC 08/22/2023 <10  <10 Final   Sperm  08/22/2023 NONE SEEN   Final   Performed at [facility], [address], [place], [place] [address]   Neisseria Gonorrhea 08/22/2023 Negative   Final   Chlamydia 08/22/2023 Negative   Final   Comment 08/22/2023 Normal Reference Ranger Chlamydia - Negative   Final   Comment 08/22/2023 Normal Reference Range Neisseria Gonorrhea - Negative   Final   Rom Plus 08/22/2023 NEGATIVE   Final   Performed at [facility], [address], [place], [place] [address]   Specimen Description 08/22/2023 VAGINAL/RECTAL   Final   Special Requests 08/22/2023 NONE   Final   Culture 08/22/2023  (A)   Final                   Value:GROUP B STREP(S.AGALACTIAE)ISOLATED TESTING AGAINST S. AGALACTIAE NOT ROUTINELY PERFORMED DUE TO PREDICTABILITY OF AMP/PEN/VAN SUSCEPTIBILITY. Performed at [facility], [address] [place], [place] [address]    Report Status 08/22/2023 08/24/2023 FINAL   Final  There may be more visits with results that are not included.    Allergies: Sulfa antibiotics, Sulfasalazine, Amphetamines, Other, Monosodium glutamate, and Sulfa drugs cross reactors  Medications:  Facility Ordered Medications  Medication   acetaminophen  (TYLENOL ) tablet 650 mg   alum & mag hydroxide-simeth (MAALOX/MYLANTA) 200-200-20 MG/5ML suspension 30 mL   magnesium  hydroxide (MILK OF MAGNESIA) suspension 30 mL   hydrOXYzine  (ATARAX ) tablet 50 mg   traZODone  (DESYREL ) tablet 100 mg   [START ON 11/06/2023] nicotine  (NICODERM CQ  - dosed in mg/24 hours) patch 21 mg   QUEtiapine  (SEROQUEL ) tablet 150 mg   PTA Medications  Medication Sig   prenatal vitamin w/FE, FA (NATACHEW) 29-1 MG CHEW chewable tablet Chew 1 tablet by mouth daily at 12 noon. (Patient not taking: Reported on 06/10/2023)   ondansetron  (ZOFRAN ) 8 MG tablet Take 1 tablet (8 mg total) by mouth every 8 (eight) hours as needed for nausea or vomiting. (Patient not taking: Reported on 10/07/2023)   SUMAtriptan  6 MG/0.5ML SOAJ Inject 6 mg  into the skin  daily as needed. Inject under the skin at onset of migraine headache. May repeat once in 1 hour if needed. Maximum of 2 injections in 24 hours. (Patient not taking: Reported on 10/07/2023)   famotidine  (PEPCID ) 20 MG tablet Take 1 tablet (20 mg total) by mouth 2 (two) times daily.   pantoprazole  (PROTONIX ) 20 MG tablet Take 1 tablet (20 mg total) by mouth daily. (Patient not taking: Reported on 10/07/2023)   cyclobenzaprine  (FLEXERIL ) 5 MG tablet Take 1 tablet (5 mg total) by mouth 3 (three) times daily as needed for muscle spasms.   senna-docusate (SENOKOT-S) 8.6-50 MG tablet Take 2 tablets by mouth daily.   simethicone  (MYLICON) 80 MG chewable tablet Chew 1 tablet (80 mg total) by mouth as needed for flatulence.   coconut oil OIL Apply 1 Application topically as needed.   witch hazel-glycerin  (TUCKS) pad Apply 1 Application topically as needed for hemorrhoids. (Patient not taking: Reported on 10/07/2023)   famotidine  (PEPCID ) 40 MG tablet Take 1 tablet (40 mg total) by mouth daily. (Patient not taking: Reported on 10/07/2023)   loratadine  (CLARITIN ) 10 MG tablet Take 1 tablet (10 mg total) by mouth daily. (Patient not taking: Reported on 10/07/2023)   FLUoxetine  (PROZAC ) 40 MG capsule Take 2 capsules (80 mg total) by mouth daily.   ibuprofen  (ADVIL ) 600 MG tablet TAKE 1 TABLET(600 MG) BY MOUTH EVERY 6 HOURS   HYDROcodone -acetaminophen  (NORCO/VICODIN) 5-325 MG tablet Take 1 tablet by mouth every 4 (four) hours as needed.   FLUoxetine  (PROZAC ) 20 MG capsule TAKE 1 CAPSULE(20 MG) BY MOUTH DAILY   amoxicillin -clavulanate (AUGMENTIN ) 875-125 MG tablet Take 1 tablet by mouth every 12 (twelve) hours.   albuterol  (VENTOLIN  HFA) 108 (90 Base) MCG/ACT inhaler Inhale 1-2 puffs into the lungs every 6 (six) hours as needed for wheezing or shortness of breath.   ondansetron  (ZOFRAN -ODT) 4 MG disintegrating tablet Take 1 tablet (4 mg total) by mouth every 8 (eight) hours as needed.      Medical  Decision Making  Patient has full capacity for medical decision making. She is a voluntary admit to the [facility].    Recommendations  Based on my evaluation the patient does not appear to have an emergency medical condition.  ASSESSMENT:   Diagnoses / Active Problems: - Bipolar disorder, depressive phase - Borderline personality disorder - Opioid Use disorder, mild current - Tobacco use disorder  PLAN: Safety and Monitoring:  -- Voluntary admission to inpatient psychiatric unit for safety, stabilization and treatment  -- Daily contact with patient to assess and evaluate symptoms and progress in treatment  -- Patient's case to be discussed in multi-disciplinary team meeting  -- Observation Level : q15 minute checks  -- Vital signs:  q12 hours  -- Precautions: suicide, elopement, and assault  2. Psychiatric Diagnoses and Treatment:   -- Continue prozac  80 mg daily  -- Start quetiapine  150 mg nightly for bipolar depression  -- Per facility PRNs --  The risks/benefits/side-effects/alternatives to this medication were discussed in detail with the patient and time was given for questions. The patient consents to medication trial.   -- Metabolic profile and EKG monitoring obtained while on an atypical antipsychotic (BMI: Lipid Panel: HbgA1c: QTc:) (pending)  -- Encouraged patient to participate in unit milieu and in scheduled group therapies   -- Short Term Goals: Ability to disclose and discuss suicidal ideas, Ability to demonstrate self-control will improve, and Ability to identify and develop effective coping behaviors will improve  -- Long Term Goals: Improvement  in symptoms so as ready for discharge    3. Medical Issues Being Addressed:  Prolonged post-partum bleeding - ordered von willebrand panel.    Tobacco Use Disorder  -- Nicotine  patch 21mg /24 hours ordered  -- Smoking cessation encouraged  4. Discharge Planning:   -- Social work and case management to assist with  discharge planning and identification of hospital follow-up needs prior to discharge  -- Estimated LOS: 5-7 days  -- Discharge Concerns: Need to establish a safety plan; Medication compliance and effectiveness  -- Discharge Goals: Return home with outpatient referrals for mental health follow-up including medication management/psychotherapy   Lynwood Morene Lavone Delsie, MD 11/05/23  4:44 PM
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
## 2023-11-05 NOTE — ED Notes (Signed)
 Pt sleeping@this  time breathing even and unlabored will continue to monitor for safety

## 2023-11-06 ENCOUNTER — Other Ambulatory Visit: Payer: Self-pay

## 2023-11-06 ENCOUNTER — Inpatient Hospital Stay (HOSPITAL_COMMUNITY)
Admission: AD | Admit: 2023-11-06 | Discharge: 2023-11-10 | DRG: 642 | Disposition: A | Discharge status: Home / Self Care | Payer: MEDICAID | Source: Intra-hospital | Attending: Psychiatry | Admitting: Psychiatry

## 2023-11-06 ENCOUNTER — Encounter (HOSPITAL_COMMUNITY): Payer: Self-pay

## 2023-11-06 DIAGNOSIS — F1491 Cocaine use, unspecified, in remission: Secondary | ICD-10-CM

## 2023-11-06 DIAGNOSIS — Z833 Family history of diabetes mellitus: Secondary | ICD-10-CM | POA: Diagnosis not present

## 2023-11-06 DIAGNOSIS — Z825 Family history of asthma and other chronic lower respiratory diseases: Secondary | ICD-10-CM | POA: Diagnosis not present

## 2023-11-06 DIAGNOSIS — F4310 Post-traumatic stress disorder, unspecified: Secondary | ICD-10-CM | POA: Diagnosis present

## 2023-11-06 DIAGNOSIS — Z841 Family history of disorders of kidney and ureter: Secondary | ICD-10-CM | POA: Diagnosis not present

## 2023-11-06 DIAGNOSIS — F603 Borderline personality disorder: Secondary | ICD-10-CM | POA: Diagnosis present

## 2023-11-06 DIAGNOSIS — Z811 Family history of alcohol abuse and dependence: Secondary | ICD-10-CM | POA: Diagnosis not present

## 2023-11-06 DIAGNOSIS — F3163 Bipolar disorder, current episode mixed, severe, without psychotic features: Secondary | ICD-10-CM | POA: Diagnosis present

## 2023-11-06 DIAGNOSIS — F17210 Nicotine dependence, cigarettes, uncomplicated: Secondary | ICD-10-CM | POA: Diagnosis present

## 2023-11-06 DIAGNOSIS — F17290 Nicotine dependence, other tobacco product, uncomplicated: Secondary | ICD-10-CM | POA: Diagnosis present

## 2023-11-06 DIAGNOSIS — Z818 Family history of other mental and behavioral disorders: Secondary | ICD-10-CM | POA: Diagnosis not present

## 2023-11-06 DIAGNOSIS — Z79899 Other long term (current) drug therapy: Secondary | ICD-10-CM

## 2023-11-06 DIAGNOSIS — F1121 Opioid dependence, in remission: Secondary | ICD-10-CM

## 2023-11-06 DIAGNOSIS — Z726 Gambling and betting: Secondary | ICD-10-CM

## 2023-11-06 DIAGNOSIS — K219 Gastro-esophageal reflux disease without esophagitis: Secondary | ICD-10-CM | POA: Diagnosis present

## 2023-11-06 DIAGNOSIS — Z800 Family history of malignant neoplasm of digestive organs: Secondary | ICD-10-CM

## 2023-11-06 DIAGNOSIS — F1521 Other stimulant dependence, in remission: Secondary | ICD-10-CM | POA: Diagnosis present

## 2023-11-06 DIAGNOSIS — F53 Postpartum depression: Secondary | ICD-10-CM

## 2023-11-06 DIAGNOSIS — N3001 Acute cystitis with hematuria: Secondary | ICD-10-CM | POA: Diagnosis not present

## 2023-11-06 DIAGNOSIS — F1411 Cocaine abuse, in remission: Secondary | ICD-10-CM | POA: Diagnosis present

## 2023-11-06 DIAGNOSIS — E785 Hyperlipidemia, unspecified: Secondary | ICD-10-CM | POA: Diagnosis present

## 2023-11-06 DIAGNOSIS — Z5986 Financial insecurity: Secondary | ICD-10-CM

## 2023-11-06 DIAGNOSIS — Z882 Allergy status to sulfonamides status: Secondary | ICD-10-CM

## 2023-11-06 DIAGNOSIS — F1010 Alcohol abuse, uncomplicated: Secondary | ICD-10-CM | POA: Diagnosis present

## 2023-11-06 DIAGNOSIS — F419 Anxiety disorder, unspecified: Secondary | ICD-10-CM | POA: Diagnosis present

## 2023-11-06 DIAGNOSIS — Z91148 Patient's other noncompliance with medication regimen for other reason: Secondary | ICD-10-CM | POA: Diagnosis not present

## 2023-11-06 DIAGNOSIS — N939 Abnormal uterine and vaginal bleeding, unspecified: Secondary | ICD-10-CM | POA: Diagnosis not present

## 2023-11-06 DIAGNOSIS — Z8249 Family history of ischemic heart disease and other diseases of the circulatory system: Secondary | ICD-10-CM | POA: Diagnosis not present

## 2023-11-06 DIAGNOSIS — H0520 Unspecified exophthalmos: Secondary | ICD-10-CM | POA: Diagnosis not present

## 2023-11-06 DIAGNOSIS — F3162 Bipolar disorder, current episode mixed, moderate: Secondary | ICD-10-CM

## 2023-11-06 DIAGNOSIS — R45851 Suicidal ideations: Secondary | ICD-10-CM | POA: Diagnosis present

## 2023-11-06 DIAGNOSIS — R112 Nausea with vomiting, unspecified: Secondary | ICD-10-CM | POA: Diagnosis present

## 2023-11-06 DIAGNOSIS — G4700 Insomnia, unspecified: Secondary | ICD-10-CM | POA: Diagnosis present

## 2023-11-06 DIAGNOSIS — Z888 Allergy status to other drugs, medicaments and biological substances status: Secondary | ICD-10-CM

## 2023-11-06 MED ORDER — QUETIAPINE FUMARATE 50 MG PO TABS
150.0000 mg | ORAL_TABLET | Freq: Every day | ORAL | Status: DC
  Filled 2023-11-06 (×2): qty 3

## 2023-11-06 MED ORDER — ALUM & MAG HYDROXIDE-SIMETH 200-200-20 MG/5ML PO SUSP: 30.0000 mL | ORAL | Status: DC | PRN

## 2023-11-06 MED ORDER — CHLORPROMAZINE HCL 25 MG PO TABS
25.0000 mg | ORAL_TABLET | Freq: Four times a day (QID) | ORAL | Status: DC | PRN
  Administered 2023-11-06 – 2023-11-10 (×4): 25 mg via ORAL
  Filled 2023-11-06 (×5): qty 1

## 2023-11-06 MED ORDER — DIPHENHYDRAMINE HCL 50 MG/ML IJ SOLN: 50.0000 mg | Freq: Three times a day (TID) | INTRAMUSCULAR | Status: DC | PRN

## 2023-11-06 MED ORDER — HALOPERIDOL LACTATE 5 MG/ML IJ SOLN
5.0000 mg | Freq: Three times a day (TID) | INTRAMUSCULAR | Status: DC | PRN
  Administered 2023-11-06: 5 mg via INTRAMUSCULAR
  Filled 2023-11-06: qty 1

## 2023-11-06 MED ORDER — HALOPERIDOL 5 MG PO TABS: 5.0000 mg | ORAL_TABLET | Freq: Three times a day (TID) | ORAL | Status: DC | PRN

## 2023-11-06 MED ORDER — FLUOXETINE HCL 20 MG PO CAPS
40.0000 mg | ORAL_CAPSULE | Freq: Every day | ORAL | Status: DC
  Administered 2023-11-07 – 2023-11-08 (×2): 40 mg via ORAL
  Filled 2023-11-06 (×6): qty 2

## 2023-11-06 MED ORDER — FLUOXETINE HCL 20 MG PO CAPS
80.0000 mg | ORAL_CAPSULE | Freq: Every day | ORAL | Status: DC
  Filled 2023-11-06 (×3): qty 4

## 2023-11-06 MED ORDER — CHLORPROMAZINE HCL 25 MG/ML IJ SOLN: 50.0000 mg | Freq: Four times a day (QID) | INTRAMUSCULAR | Status: DC | PRN

## 2023-11-06 MED ORDER — FLUOXETINE HCL 40 MG PO CAPS: 80.0000 mg | ORAL_CAPSULE | Freq: Every day | ORAL | Status: DC

## 2023-11-06 MED ORDER — BUSPIRONE HCL 5 MG PO TABS
5.0000 mg | ORAL_TABLET | Freq: Three times a day (TID) | ORAL | Status: DC
  Administered 2023-11-06 – 2023-11-09 (×8): 5 mg via ORAL
  Filled 2023-11-06 (×16): qty 1

## 2023-11-06 MED ORDER — DIPHENHYDRAMINE HCL 50 MG/ML IJ SOLN
50.0000 mg | Freq: Three times a day (TID) | INTRAMUSCULAR | Status: DC | PRN
  Administered 2023-11-06: 50 mg via INTRAMUSCULAR
  Filled 2023-11-06: qty 1

## 2023-11-06 MED ORDER — IBUPROFEN 600 MG PO TABS
600.0000 mg | ORAL_TABLET | Freq: Four times a day (QID) | ORAL | Status: DC | PRN
  Administered 2023-11-06 – 2023-11-09 (×5): 600 mg via ORAL
  Filled 2023-11-06 (×5): qty 1

## 2023-11-06 MED ORDER — MAGNESIUM HYDROXIDE 400 MG/5ML PO SUSP: 30.0000 mL | Freq: Every day | ORAL | Status: DC | PRN

## 2023-11-06 MED ORDER — QUETIAPINE FUMARATE 300 MG PO TABS
300.0000 mg | ORAL_TABLET | Freq: Every day | ORAL | Status: DC
  Administered 2023-11-07: 300 mg via ORAL
  Filled 2023-11-06 (×4): qty 1

## 2023-11-06 MED ORDER — CHLORPROMAZINE HCL 25 MG/ML IJ SOLN
25.0000 mg | Freq: Four times a day (QID) | INTRAMUSCULAR | Status: DC | PRN
  Administered 2023-11-08 – 2023-11-09 (×5): 25 mg via INTRAMUSCULAR
  Filled 2023-11-06 (×2): qty 1

## 2023-11-06 MED ORDER — LORAZEPAM 2 MG/ML IJ SOLN
2.0000 mg | Freq: Three times a day (TID) | INTRAMUSCULAR | Status: DC | PRN
  Administered 2023-11-06: 2 mg via INTRAMUSCULAR
  Filled 2023-11-06: qty 1

## 2023-11-06 MED ORDER — DIPHENHYDRAMINE HCL 25 MG PO CAPS: 50.0000 mg | ORAL_CAPSULE | Freq: Three times a day (TID) | ORAL | Status: DC | PRN

## 2023-11-06 MED ORDER — ACETAMINOPHEN 325 MG PO TABS
650.0000 mg | ORAL_TABLET | Freq: Four times a day (QID) | ORAL | Status: DC | PRN
  Administered 2023-11-06 – 2023-11-10 (×4): 650 mg via ORAL
  Filled 2023-11-06 (×5): qty 2

## 2023-11-06 MED ORDER — TRAZODONE HCL 100 MG PO TABS
100.0000 mg | ORAL_TABLET | Freq: Every evening | ORAL | Status: DC | PRN
  Administered 2023-11-07 – 2023-11-09 (×3): 100 mg via ORAL
  Filled 2023-11-06 (×5): qty 1

## 2023-11-06 MED ORDER — CHLORPROMAZINE HCL 25 MG PO TABS: 50.0000 mg | ORAL_TABLET | Freq: Four times a day (QID) | ORAL | Status: DC | PRN

## 2023-11-06 MED ORDER — QUETIAPINE FUMARATE 150 MG PO TABS: 150.0000 mg | ORAL_TABLET | Freq: Every day | ORAL | Status: DC

## 2023-11-06 MED ORDER — LORAZEPAM 2 MG/ML IJ SOLN: 2.0000 mg | Freq: Three times a day (TID) | INTRAMUSCULAR | Status: DC | PRN

## 2023-11-06 MED ORDER — NICOTINE 21 MG/24HR TD PT24
21.0000 mg | MEDICATED_PATCH | Freq: Every day | TRANSDERMAL | Status: DC
  Administered 2023-11-08 – 2023-11-09 (×2): 21 mg via TRANSDERMAL
  Filled 2023-11-06 (×6): qty 1

## 2023-11-06 MED ORDER — LOPERAMIDE HCL 2 MG PO CAPS: 2.0000 mg | ORAL_CAPSULE | ORAL | Status: DC | PRN

## 2023-11-06 MED ORDER — HALOPERIDOL LACTATE 5 MG/ML IJ SOLN: 10.0000 mg | Freq: Three times a day (TID) | INTRAMUSCULAR | Status: DC | PRN

## 2023-11-06 MED ORDER — HYDROXYZINE HCL 50 MG PO TABS
50.0000 mg | ORAL_TABLET | Freq: Three times a day (TID) | ORAL | Status: DC | PRN
  Administered 2023-11-06 – 2023-11-09 (×5): 50 mg via ORAL
  Filled 2023-11-06 (×5): qty 1

## 2023-11-06 NOTE — ED Notes (Signed)
 Pt accepted to [facility] with bed assignment. Writer called to give report but was instructed to call back in 20 min as staff is in morning report. Will callback at a later time.

## 2023-11-06 NOTE — Progress Notes (Signed)
 Pt has been accepted to [facility] on 11/06/2023 Bed assignment: 305  Pt meets inpatient criteria per: Lynwood Bash   Attending Physician will be: Dr. Levada Pillar, MD   Report can be called to: Adult unit: [phone]  Pt can arrive after pre-admit   Care Team Notified: [facility] Bretta Qua RN, Joaquin Doing RN, Lajuana Nett RN, Lynwood Bash   [place] Clif Serio LCSW-A   11/06/2023 8:34 AM
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
## 2023-11-06 NOTE — ED Notes (Signed)
 Patient A&Ox4. Continue to endorse SI, denies plan but contracts for safety. Denies A/VH. Patient denies any physical complaints when asked. No acute distress noted. Support and encouragement provided. Routine safety checks conducted according to facility protocol. Encouraged patient to notify staff if thoughts of harm toward self or others arise. Patient verbalize understanding and agreement. Will continue to monitor for safety.

## 2023-11-06 NOTE — ED Notes (Signed)
 Patient A&O x 4, ambulatory. Patient transferred to [facility] in no acute distress.  Pt belongings given to safe transport driver from locker #60  intact. Patient escorted to [facility] via staff for transport to destination. Safety maintained.

## 2023-11-06 NOTE — Progress Notes (Signed)
 Chaplain received a consult to assist [place] with advance directives. Due to patient's transfer to acute unit, chaplain is not able to have that conversation with her at this time.  If she has other spiritual or emotional needs, please place another consult or page us  at [phone].
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
## 2023-11-06 NOTE — Plan of Care (Signed)
  Problem: Activity: Goal: Sleeping patterns will improve Outcome: Progressing   

## 2023-11-06 NOTE — Tx Team (Signed)
 Initial Treatment Plan 11/06/2023 1:26 PM Misty Jimenez [id number]    PATIENT STRESSORS: Health problems   Marital or family conflict   Substance abuse     PATIENT STRENGTHS: General fund of knowledge  Motivation for treatment/growth  Supportive family/friends    PATIENT IDENTIFIED PROBLEMS: Bipolar 1 D/O mixed, severe   Substance abuse                   DISCHARGE CRITERIA:  Ability to meet basic life and health needs Improved stabilization in mood, thinking, and/or behavior Motivation to continue treatment in a less acute level of care Need for constant or close observation no longer present Verbal commitment to aftercare and medication compliance  PRELIMINARY DISCHARGE PLAN: Outpatient therapy Return to previous living arrangement  PATIENT/FAMILY INVOLVEMENT: This treatment plan has been presented to and reviewed with the patient, Misty Jimenez.  The patient and family have been given the opportunity to ask questions and make suggestions.  Imraan Wendell M Lindsey Hommel, RN 11/06/2023, 1:26 PM

## 2023-11-06 NOTE — Progress Notes (Signed)
   11/06/23 2100  Psych Admission Type (Psych Patients Only)  Admission Status Voluntary  Psychosocial Assessment  Patient Complaints Agitation;Anxiety  Eye Contact Fair  Facial Expression Sad  Affect Flat  Speech Logical/coherent  Interaction Isolative  Motor Activity Slow  Appearance/Hygiene Disheveled  Behavior Characteristics Anxious  Mood Depressed;Anxious  Aggressive Behavior  Effect No apparent injury  Thought Process  Coherency Circumstantial  Content Blaming others  Delusions None reported or observed  Perception WDL  Hallucination None reported or observed  Judgment Poor  Confusion WDL  Danger to Self  Current suicidal ideation? Denies  Danger to Others  Danger to Others None reported or observed

## 2023-11-06 NOTE — Group Note (Signed)
 Date:  11/06/2023 Time:  9:14 PM  Group Topic/Focus:  Wrap-Up Group:   The focus of this group is to help patients review their daily goal of treatment and discuss progress on daily workbooks.    Participation Level:  Did Not Attend  Varnika Butz Dacosta 11/06/2023, 9:14 PM

## 2023-11-06 NOTE — H&P (Signed)
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 associated with substance abuse/mental health issues, participate in unit milieu and in scheduled group therapies   6. Long Term Goals: Improvement in symptoms so the patient is ready for discharge   --The risks/benefits/side-effects/alternatives to the medications above were discussed in detail with the patient and time was given for questions. The patient provided informed consent.   -- Metabolic profile and EKG monitoring obtained while on an atypical antipsychotic and listed in the EHR    Total Time Spent in Direct Patient Care:  I personally spent 60 minutes on the unit in direct patient care. The direct patient care time  included face-to-face time with the patient, reviewing the patient's chart, communicating with other professionals, and coordinating care. Greater than 50% of this time was spent in counseling or coordinating care with the patient regarding goals of hospitalization, psycho-education, and discharge planning needs.   I certify that inpatient services furnished can reasonably be expected to improve the patient's condition.    Glendia Kitty, MD 11/06/2023, 2:19 PM      Portions of this note were created using voice recognition software. Minor syntax errors, grammatical content, spelling, or punctuation errors may have occurred unintentionally. Please notify the dino if the meaning of any statement is unclear. Command

## 2023-11-06 NOTE — [facility] Group Notes (Signed)

## 2023-11-06 NOTE — ED Notes (Signed)
 Pt rates anxiety 10/10 d/t being away from her newborn and her husband. Given appropriate PRN medication.

## 2023-11-07 DIAGNOSIS — F3163 Bipolar disorder, current episode mixed, severe, without psychotic features: Secondary | ICD-10-CM | POA: Diagnosis not present

## 2023-11-07 LAB — COAG STUDIES INTERP REPORT

## 2023-11-07 LAB — VON WILLEBRAND PANEL
Coagulation Factor VIII: 104 % (ref 56–140)
Ristocetin Co-factor, Plasma: 87 % (ref 50–200)
Von Willebrand Antigen, Plasma: 326 % — ABNORMAL HIGH (ref 50–200)

## 2023-11-07 LAB — FOLATE: Folate: 12.7 ng/mL (ref >5.9)

## 2023-11-07 LAB — VITAMIN D 25 HYDROXY (VIT D DEFICIENCY, FRACTURES): Vit D, 25-Hydroxy: 33.52 ng/mL (ref 30–100)

## 2023-11-07 LAB — VITAMIN B12: Vitamin B-12: 426 pg/mL (ref 180–914)

## 2023-11-07 MED ORDER — QUETIAPINE FUMARATE 50 MG PO TABS
150.0000 mg | ORAL_TABLET | Freq: Once | ORAL | Status: AC
  Administered 2023-11-07: 150 mg via ORAL
  Filled 2023-11-07 (×2): qty 3

## 2023-11-07 MED ORDER — GABAPENTIN 100 MG PO CAPS
100.0000 mg | ORAL_CAPSULE | Freq: Three times a day (TID) | ORAL | Status: DC
  Administered 2023-11-07 – 2023-11-09 (×6): 100 mg via ORAL
  Filled 2023-11-07 (×12): qty 1

## 2023-11-07 NOTE — Group Note (Signed)
 Date:  11/07/2023 Time:  9:04 PM  Group Topic/Focus:  Wrap-Up Group:   The focus of this group is to help patients review their daily goal of treatment and discuss progress on daily workbooks.    Participation Level:  Did Not Attend  Participation Quality:  Appropriate  Affect:  Appropriate  Cognitive:  Appropriate  Insight: Appropriate  Engagement in Group:  Engaged  Modes of Intervention:  Education and Exploration  Additional Comments:  Patient attended and participated in group tonight.    Gwenn Chillington Dacosta 11/07/2023, 9:04 PM

## 2023-11-07 NOTE — Progress Notes (Signed)
 Pt too lethargic to give HS medication, pt would not stay awake long enough to have meaningful conversation

## 2023-11-07 NOTE — Progress Notes (Signed)
   11/07/23 2147  Psych Admission Type (Psych Patients Only)  Admission Status Voluntary  Psychosocial Assessment  Patient Complaints Anxiety  Eye Contact Fair  Facial Expression Anxious  Affect Appropriate to circumstance;Anxious  Speech Logical/coherent  Interaction Isolative  Motor Activity Other (Comment) (wnl)  Appearance/Hygiene Improved  Behavior Characteristics Anxious  Mood Anxious;Pleasant  Thought Process  Coherency Circumstantial  Content Blaming self  Delusions None reported or observed  Perception WDL  Hallucination None reported or observed  Judgment Poor  Confusion None  Danger to Self  Current suicidal ideation? Denies

## 2023-11-07 NOTE — Progress Notes (Signed)
 Patient denies SI, HI, and AVH. Patient reports pain in her abdomen and nausea vomiting and diarrhea. Patient had no incidents of behavioral dyscontrol this shift. Patient was compliant with medications and attended groups.   Assess patient for safety, offer medications as prescribed, engage patient in 1:1 staff talks.   Continue to monitor as planned. Patient able to contract for safety.

## 2023-11-07 NOTE — Progress Notes (Signed)
 [facility] MD Progress Note  11/07/2023 5:55 PM Misty Jimenez  MRN:  [id number] Subjective:   Misty Jimenez is a 28 yr old female who presented on 1/9 to [facility] due to SI w/ intent and worsening depression, agitation, anxiety, and relapse of substance use since the birth of her child in November, she was admitted to [facility] on 1/10.  PPHx is significant for Bipolar Disorder, PTSD, Borderline Personality Disorder, Polysubstance Abuse (EtOH, Opioids, THC), and a history of former Polysubstance Abuse (Meth, Cocaine, Mushrooms, Xanax), and 4 Suicide Attempts (last 2022), Multiple Psychiatric Hospitalizations, and 1 Prior Residential Rehab treatment ([place]).   Case was discussed in the multidisciplinary team. MAR was reviewed and patient was compliant with medications, she did not receive her Seroquel  due to being asleep from the agitation medications she received earlier.  She received PRN Agitation medications of IM Haldol , Benadryl , and Ativan .  She also received PRN Tylenol , Thorazine , Hydroxyzine , and Advil  yesterday.   Psychiatric Team made the following recommendations yesterday: -Increase Seroquel  to 300 mg QHS for Mood Stability -Decrease Prozac  to 40 mg daily tomorrow -Start PRN Thorazine  for nausea and vomiting -Start PRN Thorazine  for agitation    On interview today patient reports she slept good last night.  She reports her appetite is doing poor due to continuing to have nausea and vomiting.  She reports no SI, HI, or AVH.  She reports no Paranoia or Ideas of Reference.  She reports no issues with her medications.  She reports she needs to discharge and she continues to have significant pain which started during her labor/delivery.  She also reports significant pain in her breasts due to needing to express.  Discussed with her we could order a breast pump.  Discussed with her we could trial Gabapentin  for her pain.  Discussed with her that we would order a one time dose of Seroquel  this morning  since she slept through her scheduled dose last night.  She was agreeable to this.  She reports continuing to have nausea/vomiting and diarrhea but it is lessened from yesterday.  Encouraged her to ask for as needed medications to address these symptoms.  She reports no other concerns present.   Principal Problem: Bipolar 1 disorder, mixed, severe (HCC) Diagnosis: Principal Problem:   Bipolar 1 disorder, mixed, severe (HCC) Active Problems:   Suicidal ideation   Posttraumatic stress disorder   Borderline personality disorder (HCC)   Opioid use disorder, severe, in sustained remission (HCC)   Cocaine use disorder in remission   Amphetamine use disorder, moderate, in sustained remission (HCC)  Total Time spent with patient:  I personally spent 35 minutes on the unit in direct patient care. The direct patient care time included face-to-face time with the patient, reviewing the patient's chart, communicating with other professionals, and coordinating care. Greater than 50% of this time was spent in counseling or coordinating care with the patient regarding goals of hospitalization, psycho-education, and discharge planning needs.   Past Psychiatric History: Bipolar Disorder, PTSD, Borderline Personality Disorder, Polysubstance Abuse (EtOH, Opioids, THC), and a history of former Polysubstance Abuse (Meth, Cocaine, Mushrooms, Xanax), and 4 Suicide Attempts (last 2022), Multiple Psychiatric Hospitalizations, and 1 Prior Residential Rehab treatment ([place]).  Past Medical History:  Past Medical History:  Diagnosis Date   Abdominal pain, chronic, right lower quadrant 08/03/2013   Abdominal pain, recurrent    With Headache   Acute pyelonephritis 05/07/2016   kidney surgery at 23 months of age   Acute respiratory failure with  hypoxia (HCC) 08/24/2023   ADHD (attention deficit hyperactivity disorder)    Allergy    Anxiety    Bipolar affective disorder, current episode manic with psychotic symptoms  (HCC) 02/28/2021   I was so depressed after losing my job, I was thinking of cutting myself.     Bulimia 04/17/2023   Cannabis hyperemesis syndrome concurrent with and due to cannabis abuse (HCC) 10/12/2020   Closed fracture of distal end of left radius 06/19/2016   Current smoker 10/28/2018   Depression    Endometriosis    Endometritis 07/23/2017   Family history of adverse reaction to anesthesia    grandmother gets PONV   GE reflux 12/16/2011   Hyperlipidemia    Kidney disease 05/07/2016   Left breast abscess 12/18/2021   LGSIL on Pap smear of cervix 04/13/2023   03/2023 LSIL - PAP in 1 year     Moderate cannabis use disorder (HCC) 10/28/2018   Obesity 05/07/2016   PCOS (polycystic ovarian syndrome)    Preventative health care 11/04/2016   Reflux, vesicoureteral    Respiratory illness with fever 12/27/2019   Wrist pain, left 11/04/2016    Past Surgical History:  Procedure Laterality Date   KIDNEY SURGERY     As a small child   TYMPANOSTOMY TUBE PLACEMENT     URETER SURGERY     WRIST SURGERY Left 05/2016   10 pins and 2 plates   Family History:  Family History  Problem Relation Age of Onset   Kidney disease Mother    Alcohol abuse Father        and drug use, heroin, cocaine, marijuana   Migraines Maternal Aunt    Cancer Paternal Aunt        colon in 53s deceased   Asthma Maternal Grandmother    Diabetes Maternal Grandfather    Hyperlipidemia Other    Hypertension Other    Depression Other    Stroke Neg Hx    Family Psychiatric  History:  Father- Substance Use Mother- Depression Sister- Died of Carfentanyl OD 2023-02-16 No Known Suicides   Social History:  Social History   Substance and Sexual Activity  Alcohol Use Not Currently   Alcohol/week: 2.0 - 5.0 standard drinks of alcohol   Types: 2 - 5 Cans of beer per week   Comment: 3-4 x week     Social History   Substance and Sexual Activity  Drug Use Not Currently   Types: Marijuana,  Methamphetamines    Social History   Socioeconomic History   Marital status: Married    Spouse name: Not on file   Number of children: Not on file   Years of education: Not on file   Highest education level: Not on file  Occupational History   Not on file  Tobacco Use   Smoking status: Some Days    Current packs/day: 0.00    Average packs/day: 0.3 packs/day for 2.0 years (0.5 ttl pk-yrs)    Types: Cigars, Cigarettes    Start date: 10/27/2013    Last attempt to quit: 10/28/2015    Years since quitting: 8.0   Smokeless tobacco: Former    Types: Chew   Tobacco comments:    Pt declines information  Vaping Use   Vaping status: Former  Substance and Sexual Activity   Alcohol use: Not Currently    Alcohol/week: 2.0 - 5.0 standard drinks of alcohol    Types: 2 - 5 Cans of beer per week    Comment: 3-4  x week   Drug use: Not Currently    Types: Marijuana, Methamphetamines   Sexual activity: Yes  Other Topics Concern   Not on file  Social History Narrative   Lives in boyfriend, completing High school   No dietary restrictions has a history of binge eating.. Former smoker, no alcohol or drug use.   Social Drivers of Health   Financial Resource Strain: Medium Risk (11/26/2020)   [facility]   Overall Financial Resource Strain (CARDIA)    Difficulty of Paying Living Expenses: Somewhat hard  Food Insecurity: No Food Insecurity (11/06/2023)   Hunger Vital Sign    Worried About Running Out of Food in the Last Year: Never true    Ran Out of Food in the Last Year: Never true  Transportation Needs: No Transportation Needs (11/06/2023)   PRAPARE - Administrator, Civil Service (Medical): No    Lack of Transportation (Non-Medical): No  Physical Activity: Insufficiently Active (11/26/2020)   [facility]   Exercise Vital Sign    Days of  Exercise per Week: 3 days    Minutes of Exercise per Session: 40 min  Stress: Stress Concern Present (11/26/2020)   [facility]   [employer] of Occupational Health - Occupational Stress Questionnaire    Feeling of Stress : Very much  Social Connections: Unknown (03/07/2022)   Received from [facility], [facility]   Social Network    Social Network: Not on file   Additional Social History:                         Sleep: Good  Appetite:  Poor due to nausea and vomiting  Current Medications: Current Facility-Administered Medications  Medication Dose Route Frequency Provider Last Rate Last Admin   acetaminophen  (TYLENOL ) tablet 650 mg  650 mg Oral Q6H PRN Delsie Lynwood Morene Lavone, MD   650 mg at 11/07/23 0852   alum & mag hydroxide-simeth (MAALOX/MYLANTA) 200-200-20 MG/5ML suspension 30 mL  30 mL Oral Q4H PRN Delsie Lynwood Morene Lavone, MD       busPIRone  (BUSPAR ) tablet 5 mg  5 mg Oral TID Parker, Alvin S, MD   5 mg at 11/07/23 1231   chlorproMAZINE  (THORAZINE ) tablet 25 mg  25 mg Oral QID PRN Parker, Alvin S, MD   25 mg at 11/06/23 1405   Or   chlorproMAZINE  (THORAZINE ) injection 25 mg  25 mg Intramuscular QID PRN Kennyth Starleen RAMAN, MD       chlorproMAZINE  (THORAZINE ) tablet 50 mg  50 mg Oral QID PRN Kennyth Starleen RAMAN, MD       Or   chlorproMAZINE  (THORAZINE ) injection 50 mg  50 mg Intramuscular QID PRN Kennyth Starleen RAMAN, MD       FLUoxetine  (PROZAC ) capsule 40 mg  40 mg Oral Daily Parker, Alvin S, MD   40 mg at 11/07/23 9147   gabapentin  (NEURONTIN ) capsule 100 mg  100 mg Oral TID Idris Edmundson S, MD   100 mg at 11/07/23 1231   hydrOXYzine  (ATARAX ) tablet 50 mg  50 mg Oral TID PRN Delsie Lynwood Morene Lavone, MD   50 mg at 11/07/23 0603   ibuprofen  (ADVIL ) tablet 600 mg  600 mg Oral Q6H PRN Parker, Alvin S, MD   600 mg at 11/07/23  9396   loperamide  (IMODIUM ) capsule 2 mg  2 mg  Oral PRN Parker, Alvin S, MD       magnesium  hydroxide (MILK OF MAGNESIA) suspension 30 mL  30 mL Oral Daily PRN Delsie Lynwood Morene Lavone, MD       nicotine  (NICODERM CQ  - dosed in mg/24 hours) patch 21 mg  21 mg Transdermal Q0600 Delsie Lynwood Morene Lavone, MD       QUEtiapine  (SEROQUEL ) tablet 300 mg  300 mg Oral QHS Kennyth Starleen RAMAN, MD       traZODone  (DESYREL ) tablet 100 mg  100 mg Oral QHS PRN Delsie Lynwood Morene Lavone, MD        Lab Results:  Results for orders placed or performed during the hospital encounter of 11/06/23 (from the past 48 hours)  Folate     Status: None   Collection Time: 11/07/23  6:39 AM  Result Value Ref Range   Folate 12.7 >5.9 ng/mL    Comment: Performed at [facility], [address], [place], [place] [address]  Vitamin B12     Status: None   Collection Time: 11/07/23  6:39 AM  Result Value Ref Range   Vitamin B-12 426 180 - 914 pg/mL    Comment: (NOTE) This assay is not validated for testing neonatal or myeloproliferative syndrome specimens for Vitamin B12 levels. Performed at [facility], [address] [place], [place] [address]   VITAMIN D  25 Hydroxy (Vit-D Deficiency, Fractures)     Status: None   Collection Time: 11/07/23  6:39 AM  Result Value Ref Range   Vit D, 25-Hydroxy 33.52 30 - 100 ng/mL    Comment: (NOTE) Vitamin D  deficiency has been defined by the Institute of Medicine  and an Endocrine Society practice guideline as a level of serum 25-OH  vitamin D  less than 20 ng/mL (1,2). The Endocrine Society went on to  further define vitamin D  insufficiency as a level between 21 and 29  ng/mL (2).  1. IOM (Institute of Medicine). 2010. Dietary reference intakes for  calcium and D. [place]: [employer]. 2. Holick MF, Binkley Lawton, Bischoff-Ferrari HA, et al. Evaluation,  treatment, and prevention of vitamin D  deficiency: an Endocrine  Society clinical practice  guideline, JCEM. 2011 Jul; 96(7): 1911-30.  Performed at [facility], [address], [place], [place] [address]     Blood Alcohol level:  Lab Results  Component Value Date   [facility] <10 11/05/2023   ETH <10 08/01/2019    Metabolic Disorder Labs: Lab Results  Component Value Date   HGBA1C 4.4 (L) 11/05/2023   MPG 79.58 11/05/2023   MPG 85.32 09/25/2018   Lab Results  Component Value Date   PROLACTIN 6.7 08/03/2013   Lab Results  Component Value Date   CHOL 161 11/05/2023   TRIG 95 11/05/2023   HDL 47 11/05/2023   CHOLHDL 3.4 11/05/2023   VLDL 19 11/05/2023   LDLCALC 95 11/05/2023   LDLCALC 198 (H) 09/26/2018    Physical Findings: AIMS:  , ,  ,  ,    CIWA:    COWS:     Musculoskeletal: Strength & Muscle Tone: within normal limits Gait & Station: normal Patient leans: N/A  Psychiatric Specialty Exam:  Presentation  General Appearance:  Disheveled  Eye Contact: Fair  Speech: Clear and Coherent (rapid)  Speech Volume: Normal  Handedness: Right   Mood and Affect  Mood: Anxious; Dysphoric  Affect: Congruent  Thought Process  Thought Processes: -- (mostly coherent with some disorganization)  Descriptions of Associations:Circumstantial  Orientation:Full (Time, Place and Person)  Thought Content:Scattered; Tangential  History of Schizophrenia/Schizoaffective disorder:No  Duration of Psychotic Symptoms:No data recorded Hallucinations:Hallucinations: None  Ideas of Reference:None  Suicidal Thoughts:Suicidal Thoughts: No SI Passive Intent and/or Plan: Without Intent  Homicidal Thoughts:Homicidal Thoughts: No   Sensorium  Memory: Immediate Fair  Judgment: Intact  Insight: Present   Executive Functions  Concentration: Fair  Attention Span: Fair  Recall: Good  Fund of Knowledge: Good  Language: Good   Psychomotor Activity  Psychomotor Activity: Psychomotor Activity: Normal   Assets   Assets: Communication Skills; Desire for Improvement; Social Support; Housing; Resilience   Sleep  Sleep: Sleep: Good Number of Hours of Sleep: 8.25    Physical Exam: Physical Exam Vitals and nursing note reviewed.  Constitutional:      General: She is not in acute distress.    Appearance: Normal appearance. She is not ill-appearing or toxic-appearing.  HENT:     Head: Normocephalic and atraumatic.  Pulmonary:     Effort: Pulmonary effort is normal.  Musculoskeletal:        General: Normal range of motion.  Neurological:     General: No focal deficit present.     Mental Status: She is alert.    Review of Systems  Respiratory:  Negative for cough and shortness of breath.   Cardiovascular:  Negative for chest pain.  Gastrointestinal:  Positive for diarrhea, nausea and vomiting. Negative for abdominal pain and constipation.  Neurological:  Positive for dizziness. Negative for weakness and headaches.  Psychiatric/Behavioral:  Positive for depression. Negative for hallucinations and suicidal ideas. The patient is nervous/anxious.    Blood pressure (!) 155/86, pulse (!) 105, temperature 98.2 F (36.8 C), temperature source Oral, resp. rate 16, height 5' 7 (1.702 m), weight 85.2 kg, last menstrual period 10/17/2023, SpO2 99%, currently breastfeeding. Body mass index is 29.41 kg/m.   Treatment Plan Summary: Daily contact with patient to assess and evaluate symptoms and progress in treatment and Medication management  Misty Jimenez is a 29 yr old female who presented on 1/9 to [facility] due to [facility] w/ intent and worsening depression, agitation, anxiety, and relapse of substance use since the birth of her child in November, she was admitted to [facility] on 1/10.  PPHx is significant for Bipolar Disorder, PTSD, Borderline Personality Disorder, Polysubstance Abuse (EtOH, Opioids, THC), and a history of former Polysubstance Abuse (Meth, Cocaine, Mushrooms, Xanax), and 4 Suicide Attempts (last  2022), Multiple Psychiatric Hospitalizations, and 1 Prior Residential Rehab treatment ([place]).   [place] continues to be disorganized and circumstantial.  We will order a one time dose of Seroquel  for this morning but will continue with her evening dose.  We will order a breast pump for her.  We will start Gabapentin  to address her pain and help with mood stability.    Bipolar Disorder, current episode Mania  PTSD: -Continue Seroquel  300 mg QHS for Mood Stability -One time dose of Seroquel  150 mg AM  -Continue Prozac  40 mg daily for depression -Continue Buspar  5 mg TID for anxiety -Start Gabapentin  100 mg TID for pain, anxiety, and mood stability -Continue PRN Thorazine  for agitation   Nicotine  Dependence: -Continue Nicotine  Patch 21 mg daily   -Continue PRN Thorazine  for nausea and vomiting -Continue PRN's: Tylenol , Maalox, Atarax , Advil , Imodium , Milk of Magnesia, Trazodone    Marsa GORMAN Rosser, MD 11/07/2023, 5:55 PM
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
## 2023-11-08 DIAGNOSIS — F3163 Bipolar disorder, current episode mixed, severe, without psychotic features: Secondary | ICD-10-CM | POA: Diagnosis not present

## 2023-11-08 MED ORDER — QUETIAPINE FUMARATE 400 MG PO TABS
400.0000 mg | ORAL_TABLET | Freq: Every day | ORAL | Status: DC
  Administered 2023-11-08 – 2023-11-09 (×2): 400 mg via ORAL
  Filled 2023-11-08 (×3): qty 1
  Filled 2023-11-08: qty 2

## 2023-11-08 MED ORDER — FLUOXETINE HCL 20 MG PO CAPS
20.0000 mg | ORAL_CAPSULE | Freq: Every day | ORAL | Status: DC
  Administered 2023-11-09: 20 mg via ORAL
  Filled 2023-11-08 (×4): qty 1

## 2023-11-08 NOTE — Progress Notes (Signed)
 Patient ID: Misty Jimenez Rather, female   DOB: 1996/01/05, 28 y.o.   MRN: [id number]   Pt has been anxious and irritated while talking on the phone. Pt c/o being IVC'd and not being able to have her husband visit her anymore. Pt's grandmother called RN's station and pt gave verbal consent to speak with grandmother because pt my husband and my baby live with her. Pt's grandmother reported that pt keeps calling her telling her she doesn't know how or why she was IVC'd. Pt's grandmother also provided her phone number if the Provider needed to call Misty Jimenez - [phone]). RN provided support and assurance for pt's grandmother and pt.

## 2023-11-08 NOTE — Progress Notes (Signed)
   11/08/23 0850  Psych Admission Type (Psych Patients Only)  Admission Status Voluntary  Psychosocial Assessment  Patient Complaints Anxiety  Eye Contact Fair  Facial Expression Anxious  Affect Preoccupied;Anxious  Speech Logical/coherent  Interaction Needy  Motor Activity Other (Comment) (WNL)  Appearance/Hygiene Improved  Behavior Characteristics Anxious;Irritable  Mood Anxious;Preoccupied;Irritable  Thought Process  Coherency Circumstantial  Content Blaming others;Blaming self  Delusions None reported or observed  Perception WDL  Hallucination None reported or observed  Judgment Poor  Confusion None  Danger to Self  Current suicidal ideation? Denies  Agreement Not to Harm Self Yes  Description of Agreement verbal  Danger to Others  Danger to Others None reported or observed

## 2023-11-08 NOTE — [facility] Counselor (Signed)
 Patient asked to meet with CSW this morning. Patient express her dissatisfaction with the way she is being treated at [facility]. CSW assisted the client with distress tolerance skills. Patient continued to hyper focus on her pain, and discomfort. Nursing staff and doctor have explained the medication that she is taking to her and when she is able to have another dose. CSW practiced ways to keep herself distracted. Patient showed pictures of her 32 week old daughter. Advised patient to continue to use calming strategies in order to work towards her goal of getting back home to her daughter.   Amala Petion LCSWA

## 2023-11-08 NOTE — Plan of Care (Signed)
  Problem: Coping: Goal: Ability to verbalize frustrations and anger appropriately will improve Outcome: Progressing   Problem: Health Behavior/Discharge Planning: Goal: Compliance with treatment plan for underlying cause of condition will improve Outcome: Progressing   Problem: Safety: Goal: Periods of time without injury will increase Outcome: Progressing   

## 2023-11-08 NOTE — Progress Notes (Signed)
 [facility] MD Progress Note  11/08/2023 12:51 PM Misty Jimenez  MRN:  [id number] Subjective:   Misty Jimenez is a 28 yr old female who presented on 1/9 to [facility] due to SI w/ intent and worsening depression, agitation, anxiety, and relapse of substance use since the birth of her child in November, she was admitted to [facility] on 1/10.  PPHx is significant for Bipolar Disorder, PTSD, Borderline Personality Disorder, Polysubstance Abuse (EtOH, Opioids, THC), and a history of former Polysubstance Abuse (Meth, Cocaine, Mushrooms, Xanax), and 4 Suicide Attempts (last 2022), Multiple Psychiatric Hospitalizations (approximately 30), and 1 Prior Residential Rehab treatment ([place]).  Case was discussed in the multidisciplinary team. MAR was reviewed and patient was noncompliant with a.m. medications due to nausea and vomiting.  Thorazine  25 mg was given intermuscularly for nausea and vomiting.  She continues to make somatic complaints related to pain from her neck to her vagina.  The patient was seen in her room during rounds.  She continues to report issues with nausea, vomiting, and diarrhea and is subsequently had decreased appetite.  She denies suicidal ideation, homicidal ideation, auditory hallucinations, or visual hallucinations.  Her speech remains rapid, but she is easily interruptible.  She presents as intensely anxious.  She was advised that Thorazine  is available to take IM if she is not able to hold down oral medication.  She reports that BuSpar  has been slightly helpful for anxiety, but that her anxiety remains quite high.  She reports insomnia last night partially related to pain and partially related to restlessness.  We discussed increasing Seroquel  to 400 mg to address both issues.  Will continue to down titrate fluoxetine  to promote mood stability.   Principal Problem: Bipolar 1 disorder, mixed, severe (HCC) Diagnosis: Principal Problem:   Bipolar 1 disorder, mixed, severe (HCC) Active Problems:    Suicidal ideation   Posttraumatic stress disorder   Borderline personality disorder (HCC)   Opioid use disorder, severe, in sustained remission (HCC)   Cocaine use disorder in remission   Amphetamine use disorder, moderate, in sustained remission (HCC)  Total Time spent with patient:  I personally spent 35 minutes on the unit in direct patient care. The direct patient care time included face-to-face time with the patient, reviewing the patient's chart, communicating with other professionals, and coordinating care. Greater than 50% of this time was spent in counseling or coordinating care with the patient regarding goals of hospitalization, psycho-education, and discharge planning needs.   Past Psychiatric History: Bipolar Disorder, PTSD, Borderline Personality Disorder, Polysubstance Abuse (EtOH, Opioids, THC), and a history of former Polysubstance Abuse (Meth, Cocaine, Mushrooms, Xanax), and 4 Suicide Attempts (last 2022), Multiple Psychiatric Hospitalizations, and 1 Prior Residential Rehab treatment ([place]).  Past Medical History:  Past Medical History:  Diagnosis Date   Abdominal pain, chronic, right lower quadrant 08/03/2013   Abdominal pain, recurrent    With Headache   Acute pyelonephritis 05/07/2016   kidney surgery at 64 months of age   Acute respiratory failure with hypoxia (HCC) 08/24/2023   ADHD (attention deficit hyperactivity disorder)    Allergy    Anxiety    Bipolar affective disorder, current episode manic with psychotic symptoms (HCC) 02/28/2021   I was so depressed after losing my job, I was thinking of cutting myself.     Bulimia 04/17/2023   Cannabis hyperemesis syndrome concurrent with and due to cannabis abuse (HCC) 10/12/2020   Closed fracture of distal end of left radius 06/19/2016   Current smoker 10/28/2018  Depression    Endometriosis    Endometritis 07/23/2017   Family history of adverse reaction to anesthesia    grandmother gets PONV   GE reflux  12/16/2011   Hyperlipidemia    Kidney disease 05/07/2016   Left breast abscess 12/18/2021   LGSIL on Pap smear of cervix 04/13/2023   03/2023 LSIL - PAP in 1 year     Moderate cannabis use disorder (HCC) 10/28/2018   Obesity 05/07/2016   PCOS (polycystic ovarian syndrome)    Preventative health care 11/04/2016   Reflux, vesicoureteral    Respiratory illness with fever 12/27/2019   Wrist pain, left 11/04/2016    Past Surgical History:  Procedure Laterality Date   KIDNEY SURGERY     As a small child   TYMPANOSTOMY TUBE PLACEMENT     URETER SURGERY     WRIST SURGERY Left 05/2016   10 pins and 2 plates   Family History:  Family History  Problem Relation Age of Onset   Kidney disease Mother    Alcohol abuse Father        and drug use, heroin, cocaine, marijuana   Migraines Maternal Aunt    Cancer Paternal Aunt        colon in 68s deceased   Asthma Maternal Grandmother    Diabetes Maternal Grandfather    Hyperlipidemia Other    Hypertension Other    Depression Other    Stroke Neg Hx    Family Psychiatric  History:  Father- Substance Use Mother- Depression Sister- Died of Carfentanyl OD 02/20/23 No Known Suicides   Social History:  Social History   Substance and Sexual Activity  Alcohol Use Not Currently   Alcohol/week: 2.0 - 5.0 standard drinks of alcohol   Types: 2 - 5 Cans of beer per week   Comment: 3-4 x week     Social History   Substance and Sexual Activity  Drug Use Not Currently   Types: Marijuana, Methamphetamines    Social History   Socioeconomic History   Marital status: Married    Spouse name: Not on file   Number of children: Not on file   Years of education: Not on file   Highest education level: Not on file  Occupational History   Not on file  Tobacco Use   Smoking status: Some Days    Current packs/day: 0.00    Average packs/day: 0.3 packs/day for 2.0 years (0.5 ttl pk-yrs)    Types: Cigars, Cigarettes    Start date: 10/27/2013     Last attempt to quit: 10/28/2015    Years since quitting: 8.0   Smokeless tobacco: Former    Types: Chew   Tobacco comments:    Pt declines information  Vaping Use   Vaping status: Former  Substance and Sexual Activity   Alcohol use: Not Currently    Alcohol/week: 2.0 - 5.0 standard drinks of alcohol    Types: 2 - 5 Cans of beer per week    Comment: 3-4 x week   Drug use: Not Currently    Types: Marijuana, Methamphetamines   Sexual activity: Yes  Other Topics Concern   Not on file  Social History Narrative   Lives in boyfriend, completing High school   No dietary restrictions has a history of binge eating.. Former smoker, no alcohol or drug use.   Social Drivers of Health   Financial Resource Strain: Medium Risk (11/26/2020)   [facility]  Overall Financial Resource Strain (CARDIA)    Difficulty of Paying Living Expenses: Somewhat hard  Food Insecurity: No Food Insecurity (11/06/2023)   Hunger Vital Sign    Worried About Running Out of Food in the Last Year: Never true    Ran Out of Food in the Last Year: Never true  Transportation Needs: No Transportation Needs (11/06/2023)   PRAPARE - Administrator, Civil Service (Medical): No    Lack of Transportation (Non-Medical): No  Physical Activity: Insufficiently Active (11/26/2020)   [facility]   Exercise Vital Sign    Days of Exercise per Week: 3 days    Minutes of Exercise per Session: 40 min  Stress: Stress Concern Present (11/26/2020)   [facility]   [employer] of Occupational Health - Occupational Stress Questionnaire    Feeling of Stress : Very much  Social Connections: Unknown (03/07/2022)   Received from [facility]   Social Network    Social Network: Not on file   Additional  Social History:                         Sleep: Good  Appetite:  Poor due to nausea and vomiting  Current Medications: Current Facility-Administered Medications  Medication Dose Route Frequency Provider Last Rate Last Admin   acetaminophen  (TYLENOL ) tablet 650 mg  650 mg Oral Q6H PRN Delsie Lynwood Morene Lavone, MD   650 mg at 11/07/23 0852   alum & mag hydroxide-simeth (MAALOX/MYLANTA) 200-200-20 MG/5ML suspension 30 mL  30 mL Oral Q4H PRN Delsie Lynwood Morene Lavone, MD       busPIRone  (BUSPAR ) tablet 5 mg  5 mg Oral TID Kalisa Girtman S, MD   5 mg at 11/08/23 1103   chlorproMAZINE  (THORAZINE ) tablet 25 mg  25 mg Oral QID PRN Lamondre Wesche S, MD   25 mg at 11/08/23 0630   Or   chlorproMAZINE  (THORAZINE ) injection 25 mg  25 mg Intramuscular QID PRN Lennie Dunnigan S, MD   25 mg at 11/08/23 1100   chlorproMAZINE  (THORAZINE ) tablet 50 mg  50 mg Oral QID PRN Kennyth Starleen RAMAN, MD       Or   chlorproMAZINE  (THORAZINE ) injection 50 mg  50 mg Intramuscular QID PRN Keylee Shrestha S, MD       [START ON 11/09/2023] FLUoxetine  (PROZAC ) capsule 20 mg  20 mg Oral Daily Kennyth Starleen RAMAN, MD       gabapentin  (NEURONTIN ) capsule 100 mg  100 mg Oral TID Pashayan, Alexander S, MD   100 mg at 11/08/23 1103   hydrOXYzine  (ATARAX ) tablet 50 mg  50 mg Oral TID PRN Delsie Lynwood Morene Lavone, MD   50 mg at 11/07/23 2206   ibuprofen  (ADVIL ) tablet 600 mg  600 mg Oral Q6H PRN Shaunika Italiano S, MD   600 mg at 11/07/23 2205   loperamide  (IMODIUM ) capsule 2 mg  2 mg Oral PRN Yen Wandell S, MD       magnesium  hydroxide (MILK OF MAGNESIA) suspension 30 mL  30 mL Oral Daily PRN Delsie Lynwood Morene Lavone, MD       nicotine  (NICODERM CQ  - dosed in mg/24 hours) patch 21 mg  21 mg Transdermal Q0600 Delsie Lynwood Morene Lavone, MD   21 mg at 11/08/23 1101   QUEtiapine  (SEROQUEL ) tablet 400 mg  400  mg Oral QHS Tarren Sabree S, MD       traZODone  (DESYREL ) tablet 100 mg  100 mg Oral QHS PRN Delsie Lynwood Morene Lavone, MD   100 mg at 11/07/23 2206    Lab Results:  Results for orders placed or performed during the hospital encounter of 11/06/23 (from the past 48 hours)  Folate     Status: None   Collection Time: 11/07/23  6:39 AM  Result Value Ref Range   Folate 12.7 >5.9 ng/mL    Comment: Performed at [facility], [address] [place], [place] [address]  Vitamin B12     Status: None   Collection Time: 11/07/23  6:39 AM  Result Value Ref Range   Vitamin B-12 426 180 - 914 pg/mL    Comment: (NOTE) This assay is not validated for testing neonatal or myeloproliferative syndrome specimens for Vitamin B12 levels. Performed at [facility], [address], [place], [place] [address]   VITAMIN D  25 Hydroxy (Vit-D Deficiency, Fractures)     Status: None   Collection Time: 11/07/23  6:39 AM  Result Value Ref Range   Vit D, 25-Hydroxy 33.52 30 - 100 ng/mL    Comment: (NOTE) Vitamin D  deficiency has been defined by the Institute of Medicine  and an Endocrine Society practice guideline as a level of serum 25-OH  vitamin D  less than 20 ng/mL (1,2). The Endocrine Society went on to  further define vitamin D  insufficiency as a level between 21 and 29  ng/mL (2).  1. IOM (Institute of Medicine). 2010. Dietary reference intakes for  calcium and D. [place]: The [employer]. 2. Holick MF, [place], Bischoff-Ferrari HA, et al. Evaluation,  treatment, and prevention of vitamin D  deficiency: an Endocrine  Society clinical practice guideline, JCEM. 2011 Jul; 96(7): 1911-30.  Performed at [facility], [address] [place], [place] [address]     Blood Alcohol level:  Lab Results  Component Value Date   [facility] <10 11/05/2023   ETH <10 08/01/2019    Metabolic Disorder Labs: Lab Results  Component Value Date   HGBA1C 4.4 (L) 11/05/2023   MPG 79.58 11/05/2023   MPG 85.32 09/25/2018   Lab Results  Component  Value Date   PROLACTIN 6.7 08/03/2013   Lab Results  Component Value Date   CHOL 161 11/05/2023   TRIG 95 11/05/2023   HDL 47 11/05/2023   CHOLHDL 3.4 11/05/2023   VLDL 19 11/05/2023   LDLCALC 95 11/05/2023   LDLCALC 198 (H) 09/26/2018    Physical Findings: AIMS:  , ,  ,  ,    CIWA:    COWS:     Musculoskeletal: Strength & Muscle Tone: within normal limits Gait & Station: normal Patient leans: N/A  Psychiatric Specialty Exam:  Presentation  General Appearance:  Fairly Groomed  Eye Contact: Good  Speech: Clear and Coherent  Speech Volume: Normal  Handedness: Right   Mood and Affect  Mood: Anxious; Dysphoric  Affect: Congruent; Labile   Thought Process  Thought Processes: Coherent  Descriptions of Associations:Intact  Orientation:Full (Time, Place and Person)  Thought Content:Logical  History of Schizophrenia/Schizoaffective disorder:No  Duration of Psychotic Symptoms:No data recorded Hallucinations:Hallucinations: None  Ideas of Reference:None  Suicidal Thoughts:Suicidal Thoughts: No  Homicidal Thoughts:Homicidal Thoughts: No   Sensorium  Memory: Immediate Good; Remote Good  Judgment: Fair  Insight: Poor   Executive Functions  Concentration: Good  Attention Span: Good  Recall: Good  Fund of Knowledge: Good  Language: Good   Psychomotor Activity  Psychomotor Activity: Psychomotor Activity: Increased   Assets  Assets: Communication Skills; Desire for Improvement; Social Support   Sleep  Sleep: Sleep: Poor Number of Hours of Sleep: 8.25    Physical Exam: Physical Exam Vitals and nursing note reviewed.  Constitutional:      General: She is not in acute distress.    Appearance: Normal appearance. She is not ill-appearing or toxic-appearing.  HENT:     Head: Normocephalic and atraumatic.  Pulmonary:     Effort: Pulmonary effort is normal.  Musculoskeletal:        General: Normal range of motion.   Neurological:     General: No focal deficit present.     Mental Status: She is alert.    Review of Systems  Respiratory:  Negative for cough and shortness of breath.   Cardiovascular:  Negative for chest pain.  Gastrointestinal:  Positive for diarrhea, nausea and vomiting. Negative for abdominal pain and constipation.  Neurological:  Positive for dizziness. Negative for weakness and headaches.  Psychiatric/Behavioral:  Positive for depression. Negative for hallucinations and suicidal ideas. The patient is nervous/anxious.    Blood pressure 124/84, pulse 85, temperature 98.1 F (36.7 C), temperature source Oral, resp. rate 16, height 5' 7 (1.702 m), weight 85.2 kg, last menstrual period 10/17/2023, SpO2 98%, currently breastfeeding. Body mass index is 29.41 kg/m.   Treatment Plan Summary: Daily contact with patient to assess and evaluate symptoms and progress in treatment and Medication management  Sharis L Kruczek is a 28 yr old female who presented on 1/9 to [facility] due to [facility] w/ intent and worsening depression, agitation, anxiety, and relapse of substance use since the birth of her child in November, she was admitted to [facility] on 1/10.  PPHx is significant for Bipolar Disorder, PTSD, Borderline Personality Disorder, Polysubstance Abuse (EtOH, Opioids, THC), and a history of former Polysubstance Abuse (Meth, Cocaine, Mushrooms, Xanax), and 4 Suicide Attempts (last 2022), Multiple Psychiatric Hospitalizations, and 1 Prior Residential Rehab treatment ([place]).  Patient is more linear in conversation today.  Speech remains rapid, but she is easily interruptible.  Somatic complaints as well as nausea, vomiting, and diarrhea persist.  Patient was encouraged to use IM medications that she is not able to hold down oral medications.  Emotional lability is improved since admission, but still present.    Bipolar Disorder, current episode Mania  PTSD: -Increase Seroquel  to 400 mg QHS for Mood  Stability -Decrease Prozac  from 40 mg to 20 mg -Continue Buspar  5 mg TID for anxiety -Continue gabapentin  100 mg TID for pain, anxiety, and mood stability -Continue PRN Thorazine  for agitation, nausea, vomiting, and hiccups   Nicotine  Dependence: -Continue Nicotine  Patch 21 mg daily   -Continue PRN Thorazine  for nausea and vomiting -Continue PRN's: Tylenol , Maalox, Atarax , Advil , Imodium , Milk of Magnesia, Trazodone    Starleen GORMAN Kitty, MD 11/08/2023, 12:51 PM

## 2023-11-08 NOTE — [facility] Group Notes (Signed)
 Adult Psychoeducational Group Note  Date:  11/08/2023 Time:  10:31 AM  Group Topic/Focus:  Goals Group:   The focus of this group is to help patients establish daily goals to achieve during treatment and discuss how the patient can incorporate goal setting into their daily lives to aide in recovery. Orientation:   The focus of this group is to educate the patient on the purpose and policies of crisis stabilization and provide a format to answer questions about their admission.  The group details unit policies and expectations of patients while admitted.  Participation Level:  Active  Participation Quality:  Attentive  Affect:  Appropriate  Cognitive:  Appropriate  Insight: Appropriate  Engagement in Group:  Engaged  Modes of Intervention:  Discussion  Additional Comments:  Pt attended the goals group and remained appropriate and engaged throughout the duration of the group.   Misty Jimenez 11/08/2023, 10:31 AM

## 2023-11-08 NOTE — Group Note (Signed)
 Date:  11/08/2023 Time:  9:56 PM  Group Topic/Focus:  Wrap-Up Group:   The focus of this group is to help patients review their daily goal of treatment and discuss progress on daily workbooks.    Participation Level:  Did Not Attend   Misty Jimenez 11/08/2023, 9:56 PM

## 2023-11-09 ENCOUNTER — Encounter (HOSPITAL_COMMUNITY): Payer: Self-pay

## 2023-11-09 DIAGNOSIS — F3163 Bipolar disorder, current episode mixed, severe, without psychotic features: Secondary | ICD-10-CM | POA: Diagnosis not present

## 2023-11-09 MED ORDER — GABAPENTIN 100 MG PO CAPS: 100.0000 mg | ORAL_CAPSULE | Freq: Three times a day (TID) | ORAL | 0 refills | Status: DC

## 2023-11-09 MED ORDER — BUSPIRONE HCL 5 MG PO TABS: 5.0000 mg | ORAL_TABLET | Freq: Three times a day (TID) | ORAL | 0 refills | Status: DC

## 2023-11-09 MED ORDER — QUETIAPINE FUMARATE 400 MG PO TABS: 400.0000 mg | ORAL_TABLET | Freq: Every day | ORAL | 0 refills | Status: DC

## 2023-11-09 MED ORDER — FLUOXETINE HCL 20 MG PO CAPS: 20.0000 mg | ORAL_CAPSULE | Freq: Every day | ORAL | 0 refills | Status: DC

## 2023-11-09 MED ORDER — NICOTINE 21 MG/24HR TD PT24: 21.0000 mg | MEDICATED_PATCH | Freq: Every day | TRANSDERMAL | 0 refills | Status: DC

## 2023-11-09 NOTE — B[facility] Suicide Risk Assessment (Signed)
 [facility] INPATIENT:  Family/Significant Other Suicide Prevention Education  Suicide Prevention Education:  Education Completed; Misty Jimenez - husband [phone],  (name of family member/significant other) has been identified by the patient as the family member/significant other with whom the patient will be residing, and identified as the person(s) who will aid the patient in the event of a mental health crisis (suicidal ideations/suicide attempt).  With written consent from the patient, the family member/significant other has been provided the following suicide prevention education, prior to the and/or following the discharge of the patient.  Misty Jimenez - husband [phone] said that he doesn't have any concerns with Misty Jimenez coming home.  He said that they don't have any weapons in the home.  When asked about medications, he said, I will take care of it.  He said he will keep medications in a special place.  [name]  [name]  [name]  The suicide prevention education provided includes the following: Suicide risk factors Suicide prevention and interventions National Suicide Hotline telephone number [facility] assessment telephone number [facility] Emergency Assistance 911 [facility] and/or Residential Mobile Crisis Unit telephone number  Request made of family/significant other to: Remove weapons (e.g., guns, rifles, knives), all items previously/currently identified as safety concern.   Remove drugs/medications (over-the-counter, prescriptions, illicit drugs), all items previously/currently identified as a safety concern.  The family member/significant other verbalizes understanding of the suicide prevention education information provided.  The family member/significant other agrees to remove the items of safety concern listed above.  Misty Jimenez 11/09/2023, 3:04 PM

## 2023-11-09 NOTE — Plan of Care (Signed)
   Problem: Education: Goal: Knowledge of [facility] Education information/materials will improve Outcome: Progressing Goal: Emotional status will improve Outcome: Progressing Goal: Mental status will improve Outcome: Progressing Goal: Verbalization of understanding the information provided will improve Outcome: Progressing   Problem: Safety: Goal: Periods of time without injury will increase Outcome: Progressing

## 2023-11-09 NOTE — Group Note (Signed)
 Recreation Therapy Group Note   Group Topic:General Recreation  Group Date: 11/09/2023 Start Time: 1005 End Time: 1030 Facilitators: Pasha Gadison-McCall, LRT,CTRS Location: [address] Day[address]   Group Topic/Focus: General Recreation   Goal Area(s) Addresses:  Patient will discuss the importance of music in daily life. Patient will appropriately engage in group session.  Behavioral Response: Appropriate   Intervention: Music  Activity:  Music Therapy. LRT and patients discussed how music can affect a person's mood and emotions. Patients were then allowed to pick songs they wanted to play for the group. Patients would also share the importance of the songs they chose. Patient songs had to be clean and appropriate for group session.     Affect/Mood: Appropriate   Participation Level: Engaged   Participation Quality: Independent   Behavior: Appropriate   Speech/Thought Process: Focused   Insight: Good   Judgement: Good   Modes of Intervention: Music   Patient Response to Interventions:  Engaged   Education Outcome:  In group clarification offered    Clinical Observations/Individualized Feedback: Pt was bright and attentive to the songs of peers. Pt asked questions as needed and was respectful. Pt also described one of the songs she described as being important to her because she and her husband played while she was giving birth to they child.    Plan: Continue to engage patient in RT group sessions 2-3x/week.   Arman Loy-McCall, LRT,CTRS 11/09/2023 12:59 PM

## 2023-11-09 NOTE — Group Note (Signed)
 Date:  11/09/2023 Time:  9:17 PM  Group Topic/Focus:  Wrap-Up Group:   The focus of this group is to help patients review their daily goal of treatment and discuss progress on daily workbooks.    Participation Level:  Active  Participation Quality:  Appropriate  Affect:  Appropriate  Cognitive:  Appropriate  Insight: Appropriate  Engagement in Group:  Developing/Improving  Modes of Intervention:  Discussion  Additional Comments:  Pt stated her goal for today was to focus on her treatment plan and discuss her discharge plan with her treatment team. Pt stated she accomplished her goals today. Pt stated she talked with her doctor and with her social worker about her care today. Pt rated her overall day a 10. Pt stated she was able to contact her mother, grandmother, husband, and her daughter today which improved her overall day. Pt stated she felt better about herself tonight. Pt stated she was able to attend lunch and dinner today. Pt stated she took all medications provided today. Pt stated her appetite was poor today. Pt rated her sleep last night was pretty good. Pt stated the goal tonight was to get some rest. Pt stated she had some physical pain tonight. Pt stated she had some severe body pain tonight. Pt rated the severe body pain a 8 on the pain level scale. Pt nurse was updated on the situation. Pt deny visual hallucinations and auditory issues tonight. Pt denies thoughts of harming herself or others. Pt stated she would alert staff if anything changed.  Lonni Na 11/09/2023, 9:17 PM

## 2023-11-09 NOTE — Plan of Care (Signed)
 Alert and oriented x4. Compliant w/ scheduled meds.Attended and participated in group. Denies SI/HI/hallucinations/ & voice. Safety checks performed 15 min check. Care, comfort, and safety ongoing/maintained.   Problem: Education: Goal: Knowledge of [facility] Education information/materials will improve Outcome: Progressing Goal: Emotional status will improve Outcome: Progressing Goal: Mental status will improve Outcome: Progressing Goal: Verbalization of understanding the information provided will improve Outcome: Progressing   Problem: Activity: Goal: Interest or engagement in activities will improve Outcome: Progressing Goal: Sleeping patterns will improve Outcome: Progressing   Problem: Coping: Goal: Ability to verbalize frustrations and anger appropriately will improve Outcome: Progressing Goal: Ability to demonstrate self-control will improve Outcome: Progressing   Problem: Health Behavior/Discharge Planning: Goal: Identification of resources available to assist in meeting health care needs will improve Outcome: Progressing Goal: Compliance with treatment plan for underlying cause of condition will improve Outcome: Progressing   Problem: Physical Regulation: Goal: Ability to maintain clinical measurements within normal limits will improve Outcome: Progressing   Problem: Safety: Goal: Periods of time without injury will increase Outcome: Progressing   Problem: Education: Goal: Utilization of techniques to improve thought processes will improve Outcome: Progressing Goal: Knowledge of the prescribed therapeutic regimen will improve Outcome: Progressing   Problem: Activity: Goal: Interest or engagement in leisure activities will improve Outcome: Progressing Goal: Imbalance in normal sleep/wake cycle will improve Outcome: Progressing   Problem: Coping: Goal: Coping ability will improve Outcome: Progressing Goal: Will verbalize feelings Outcome:  Progressing   Problem: Health Behavior/Discharge Planning: Goal: Ability to make decisions will improve Outcome: Progressing Goal: Compliance with therapeutic regimen will improve Outcome: Progressing   Problem: Role Relationship: Goal: Will demonstrate positive changes in social behaviors and relationships Outcome: Progressing   Problem: Safety: Goal: Ability to disclose and discuss suicidal ideas will improve Outcome: Progressing Goal: Ability to identify and utilize support systems that promote safety will improve Outcome: Progressing   Problem: Self-Concept: Goal: Will verbalize positive feelings about self Outcome: Progressing Goal: Level of anxiety will decrease Outcome: Progressing   Problem: Education: Goal: Ability to state activities that reduce stress will improve Outcome: Progressing   Problem: Coping: Goal: Ability to identify and develop effective coping behavior will improve Outcome: Progressing   Problem: Self-Concept: Goal: Ability to identify factors that promote anxiety will improve Outcome: Progressing Goal: Level of anxiety will decrease Outcome: Progressing Goal: Ability to modify response to factors that promote anxiety will improve Outcome: Progressing

## 2023-11-09 NOTE — Progress Notes (Signed)
 D: Patient is alert, oriented, needy, and cooperative. Denies SI, HI, AVH, and verbally contracts for safety. Patient reports she slept good last night with sleeping medication. Patient reports her appetite as fair, energy level as normal, and concentration as good. Patient rates her depression 0/10, hopelessness 0/10, and anxiety 2/10. Patient reports pain from breasts to vagina and nausea/vomiting.    A: Scheduled medications administered per MD order. Support provided. Patient educated on safety on the unit and medications. Routine safety checks every 15 minutes. Patient stated understanding to tell nurse about any new physical symptoms. Patient understands to tell staff of any needs.     R: No adverse drug reactions noted. Patient remains safe at this time and will continue to monitor.    11/09/23 1000  Psych Admission Type (Psych Patients Only)  Admission Status Voluntary  Psychosocial Assessment  Patient Complaints Anxiety  Eye Contact Fair  Facial Expression Anxious  Affect Preoccupied;Anxious  Speech Logical/coherent  Interaction Assertive;Needy  Motor Activity Other (Comment) (WNL)  Appearance/Hygiene Unremarkable  Behavior Characteristics Anxious  Mood Anxious;Preoccupied  Thought Process  Coherency Circumstantial  Content Blaming others;Blaming self  Delusions None reported or observed  Perception WDL  Hallucination None reported or observed  Judgment Poor  Confusion None  Danger to Self  Current suicidal ideation? Denies  Agreement Not to Harm Self Yes  Description of Agreement verbal  Danger to Others  Danger to Others None reported or observed

## 2023-11-09 NOTE — Group Note (Signed)
 LCSW Group Therapy Note   Group Date: 11/09/2023 Start Time: 1300 End Time: 1400   Type of Therapy:  Group Therapy  Topic:  Stress Less: Techniques for Relaxation and Well-Being  Participation:  Patient participated for 5 minutes and left due to physical pain.  Objectives:  The objective of this session is to introduce participants to effective techniques and strategies for managing and reducing stress.   Goals: Reduce immediate stress: Provide participants with tools that can help them relax and feel less stressed in the moment. Increase awareness of stress triggers: Help participants identify the sources of their stress and how to address them proactively. Promote healthy coping strategies: Encourage the use of constructive methods, rather than unhealthy coping mechanisms, to deal with stress. Support long-term stress management: Empower participants to incorporate destressing techniques into their daily lives for sustained mental and emotional health.  Summary: By the end of the session, participants had the opportunity to: Understand the impact of stress: Recognize how stress affects both mental and physical health. Learn practical destressing techniques: Explore simple and accessible methods for managing stress and promoting relaxation, including deep breathing, progressive muscle relaxation, self-compassion, cognitive restructuring, social support, physical movement, sleep, creative expression, and mindful eating. Develop personalized strategies: Be equipped with tools that can be tailored to their own needs for managing stress in everyday situations. Build resilience: Understand how consistent practice of destressing techniques can enhance emotional well-being and help build resilience against future stressors.  Modalities:   Elements of DBT Elements of CBT  Anes Rigel O Arti Trang, LCSWA 11/09/2023  5:04 PM

## 2023-11-09 NOTE — BH IP Treatment Plan (Signed)
 Interdisciplinary Treatment and Diagnostic Plan Update  11/09/2023 Time of Session: 12:00 pm Misty Jimenez MRN: [id number]  Principal Diagnosis: Bipolar 1 disorder, mixed, severe (HCC)  Secondary Diagnoses: Principal Problem:   Bipolar 1 disorder, mixed, severe (HCC) Active Problems:   Suicidal ideation   Posttraumatic stress disorder   Borderline personality disorder (HCC)   Opioid use disorder, severe, in sustained remission (HCC)   Cocaine use disorder in remission   Amphetamine use disorder, moderate, in sustained remission (HCC)   Current Medications:  Current Facility-Administered Medications  Medication Dose Route Frequency Provider Last Rate Last Admin   acetaminophen  (TYLENOL ) tablet 650 mg  650 mg Oral Q6H PRN Delsie Lynwood Morene Lavone, MD   650 mg at 11/09/23 1628   alum & mag hydroxide-simeth (MAALOX/MYLANTA) 200-200-20 MG/5ML suspension 30 mL  30 mL Oral Q4H PRN Delsie Lynwood Morene Lavone, MD       busPIRone  (BUSPAR ) tablet 5 mg  5 mg Oral TID Parker, Alvin S, MD   5 mg at 11/09/23 1627   chlorproMAZINE  (THORAZINE ) tablet 25 mg  25 mg Oral QID PRN Parker, Alvin S, MD   25 mg at 11/09/23 1057   Or   chlorproMAZINE  (THORAZINE ) injection 25 mg  25 mg Intramuscular QID PRN Parker, Alvin S, MD   25 mg at 11/09/23 2031   chlorproMAZINE  (THORAZINE ) tablet 50 mg  50 mg Oral QID PRN Kennyth Starleen RAMAN, MD       Or   chlorproMAZINE  (THORAZINE ) injection 50 mg  50 mg Intramuscular QID PRN Kennyth Starleen RAMAN, MD       FLUoxetine  (PROZAC ) capsule 20 mg  20 mg Oral Daily Parker, Alvin S, MD   20 mg at 11/09/23 0745   gabapentin  (NEURONTIN ) capsule 100 mg  100 mg Oral TID Pashayan, Alexander S, MD   100 mg at 11/09/23 1627   hydrOXYzine  (ATARAX ) tablet 50 mg  50 mg Oral TID PRN Delsie Lynwood Morene Lavone, MD   50 mg at 11/09/23 1312   ibuprofen  (ADVIL ) tablet 600 mg  600 mg Oral Q6H PRN Parker, Alvin S, MD   600 mg at 11/09/23 2035   loperamide  (IMODIUM ) capsule 2 mg  2  mg Oral PRN Kennyth Starleen RAMAN, MD       magnesium  hydroxide (MILK OF MAGNESIA) suspension 30 mL  30 mL Oral Daily PRN Delsie Lynwood Morene Lavone, MD       nicotine  (NICODERM CQ  - dosed in mg/24 hours) patch 21 mg  21 mg Transdermal Q0600 Delsie Lynwood Morene Lavone, MD   21 mg at 11/09/23 9357   QUEtiapine  (SEROQUEL ) tablet 400 mg  400 mg Oral QHS Parker, Alvin S, MD   400 mg at 11/09/23 2030   traZODone  (DESYREL ) tablet 100 mg  100 mg Oral QHS PRN Delsie Lynwood Morene Lavone, MD   100 mg at 11/09/23 2035   PTA Medications: Medications Prior to Admission  Medication Sig Dispense Refill Last Dose/Taking   albuterol  (VENTOLIN  HFA) 108 (90 Base) MCG/ACT inhaler Inhale 1-2 puffs into the lungs every 6 (six) hours as needed for wheezing or shortness of breath.   Taking As Needed   amoxicillin -clavulanate (AUGMENTIN ) 875-125 MG tablet Take 1 tablet by mouth 2 (two) times daily. 7 day supply from 12/28. Patient stated she still has 3-4 days of abx left   Taking   FLUoxetine  (PROZAC ) 40 MG capsule Take 2 capsules (80 mg total) by mouth daily.   11/05/2023   QUEtiapine  Fumarate 150 MG TABS  Take 150 mg by mouth at bedtime.       Patient Stressors: Health problems   Marital or family conflict   Substance abuse    Patient Strengths: Automotive Engineer for treatment/growth  Supportive family/friends   Treatment Modalities: Medication Management, Group therapy, Case management,  1 to 1 session with clinician, Psychoeducation, Recreational therapy.   Physician Treatment Plan for Primary Diagnosis: Bipolar 1 disorder, mixed, severe (HCC) Long Term Goal(s):     Short Term Goals:    Medication Management: Evaluate patient's response, side effects, and tolerance of medication regimen.  Therapeutic Interventions: 1 to 1 sessions, Unit Group sessions and Medication administration.  Evaluation of Outcomes: Not Progressing  Physician Treatment Plan for Secondary Diagnosis:  Principal Problem:   Bipolar 1 disorder, mixed, severe (HCC) Active Problems:   Suicidal ideation   Posttraumatic stress disorder   Borderline personality disorder (HCC)   Opioid use disorder, severe, in sustained remission (HCC)   Cocaine use disorder in remission   Amphetamine use disorder, moderate, in sustained remission (HCC)  Long Term Goal(s):     Short Term Goals:       Medication Management: Evaluate patient's response, side effects, and tolerance of medication regimen.  Therapeutic Interventions: 1 to 1 sessions, Unit Group sessions and Medication administration.  Evaluation of Outcomes: Adequate for Discharge   RN Treatment Plan for Primary Diagnosis: Bipolar 1 disorder, mixed, severe (HCC) Long Term Goal(s): Knowledge of disease and therapeutic regimen to maintain health will improve  Short Term Goals: Ability to remain free from injury will improve, Ability to verbalize frustration and anger appropriately will improve, Ability to demonstrate self-control, Ability to participate in decision making will improve, Ability to verbalize feelings will improve, Ability to disclose and discuss suicidal ideas, Ability to identify and develop effective coping behaviors will improve, and Compliance with prescribed medications will improve  Medication Management: RN will administer medications as ordered by provider, will assess and evaluate patient's response and provide education to patient for prescribed medication. RN will report any adverse and/or side effects to prescribing provider.  Therapeutic Interventions: 1 on 1 counseling sessions, Psychoeducation, Medication administration, Evaluate responses to treatment, Monitor vital signs and CBGs as ordered, Perform/monitor CIWA, COWS, AIMS and Fall Risk screenings as ordered, Perform wound care treatments as ordered.  Evaluation of Outcomes: Adequate for Discharge   LCSW Treatment Plan for Primary Diagnosis: Bipolar 1 disorder,  mixed, severe (HCC) Long Term Goal(s): Safe transition to appropriate next level of care at discharge, Engage patient in therapeutic group addressing interpersonal concerns.  Short Term Goals: Engage patient in aftercare planning with referrals and resources, Increase ability to appropriately verbalize feelings, and Facilitate acceptance of mental health diagnosis and concerns  Therapeutic Interventions: Assess for all discharge needs, 1 to 1 time with Social worker, Explore available resources and support systems, Assess for adequacy in community support network, Educate family and significant other(s) on suicide prevention, Complete Psychosocial Assessment, Interpersonal group therapy.  Evaluation of Outcomes: Progressing   Progress in Treatment: Attending groups: Yes. Participating in groups: Yes. Taking medication as prescribed: Yes. Toleration medication: Yes. Family/Significant other contact made: Yes, individual(s) contacted:  Misty Jimenez, Misty Jimenez (Spouse) [phone] Patient understands diagnosis: Yes. Discussing patient identified problems/goals with staff: Yes. Medical problems stabilized or resolved: Yes. Denies suicidal/homicidal ideation: Yes. Issues/concerns per patient self-inventory: No. Other: none reported  New problem(s) identified: No, Describe:  none reported  New Short Term/Long Term Goal(s): Safe transition to appropriate next level of care at discharge,  Engage patient in therapeutic groups addressing interpersonal concerns.    Patient Goals:   I would like to get out of my post partum depression and I feel I have accomplished that and would like to have medications managed   Discharge Plan or Barriers: Patient recently admitted. CSW will continue to follow and assess for appropriate referrals and possible discharge planning.    Reason for Continuation of Hospitalization: Anxiety Depression Suicidal ideation  Estimated Length of Stay: 3-5 days  Last 3  [place] Suicide Severity Risk Score: Flowsheet Row Admission (Current) [facility] from 11/05/2023 in [facility] ED from 11/01/2023 in [facility] Emergency Department at [facility]  C-SSRS RISK CATEGORY Low Risk Moderate Risk No Risk       Last [facility] 2/9 Scores:    04/08/2023    8:48 AM 11/04/2016    9:57 AM  Depression screen PHQ 2/9  Decreased Interest 2 1  Down, Depressed, Hopeless 3 1  PHQ - 2 Score 5 2  Altered sleeping 3   Tired, decreased energy 3   Change in appetite 3   Feeling bad or failure about yourself  3   Trouble concentrating 2   Moving slowly or fidgety/restless 0   Suicidal thoughts 0   PHQ-9 Score 19     Scribe for Treatment Team: Benjaman Donia JONELLE ISRAEL 11/09/2023 8:59 PM

## 2023-11-09 NOTE — Progress Notes (Signed)
 [facility] MD Progress Note  11/09/2023 10:01 AM Misty Jimenez  MRN:  [id number] Subjective: 27 year old Caucasian female, married, lives with her husband.  Recently had a baby.  Had not been adherent with her medication while she was pregnant.  Postpartum she has relapsed into alcohol abuse and gambling.  She presented to the emergency room in company of her husband. Routine labs were essentially normal.  UDS was positive for THC.  I assumed care of this patient today.  Chart reviewed.  Patient discussed at team.  Nursing staff reports that patient has been doing well.  No observed response to internal stimuli.  Not endorsing any delusional statements.  She slept for over 7 hours.  She is keen on being discharged soon as she has an appointment tomorrow.  Social worker is going to get feedback on collateral from her husband.  I met with the patient for the first time today.  She tells me that she feels really good now that she is back on her medications.  States that she feels they will be keeled.  She has been able to sleep well at night.  She feels well rested.  Patient is not endorsing any form of delusion.  She is not endorsing any form of hallucination.  She feels in control of her thoughts.  Her thoughts are not racing.  She showed me pictures of her baby and her husband.  States that she is missing her family and wants to be with them.  She has been in communication with her husband regularly.  States that he did visit once.  Patient states that she has an appointment with her pain management team at 8 AM tomorrow.  She does not want to miss that appointment.  We have agreed to keep her medicine the same and get feedback from her husband.    Principal Problem: Bipolar 1 disorder, mixed, severe (HCC) Diagnosis: Principal Problem:   Bipolar 1 disorder, mixed, severe (HCC) Active Problems:   Suicidal ideation   Posttraumatic stress disorder   Borderline personality disorder (HCC)   Opioid  use disorder, severe, in sustained remission (HCC)   Cocaine use disorder in remission   Amphetamine use disorder, moderate, in sustained remission (HCC)  Total Time spent with patient: 30 minutes  Past Psychiatric History:  See H&P  Past Medical History:  Past Medical History:  Diagnosis Date   Abdominal pain, chronic, right lower quadrant 08/03/2013   Abdominal pain, recurrent    With Headache   Acute pyelonephritis 05/07/2016   kidney surgery at 52 months of age   Acute respiratory failure with hypoxia (HCC) 08/24/2023   ADHD (attention deficit hyperactivity disorder)    Allergy    Anxiety    Bipolar affective disorder, current episode manic with psychotic symptoms (HCC) 02/28/2021   I was so depressed after losing my job, I was thinking of cutting myself.     Bulimia 04/17/2023   Cannabis hyperemesis syndrome concurrent with and due to cannabis abuse (HCC) 10/12/2020   Closed fracture of distal end of left radius 06/19/2016   Current smoker 10/28/2018   Depression    Endometriosis    Endometritis 07/23/2017   Family history of adverse reaction to anesthesia    grandmother gets PONV   GE reflux 12/16/2011   Hyperlipidemia    Kidney disease 05/07/2016   Left breast abscess 12/18/2021   LGSIL on Pap smear of cervix 04/13/2023   03/2023 LSIL - PAP in 1 year  Moderate cannabis use disorder (HCC) 10/28/2018   Obesity 05/07/2016   PCOS (polycystic ovarian syndrome)    Preventative health care 11/04/2016   Reflux, vesicoureteral    Respiratory illness with fever 12/27/2019   Wrist pain, left 11/04/2016    Past Surgical History:  Procedure Laterality Date   KIDNEY SURGERY     As a small child   TYMPANOSTOMY TUBE PLACEMENT     URETER SURGERY     WRIST SURGERY Left 05/2016   10 pins and 2 plates   Family History:  Family History  Problem Relation Age of Onset   Kidney disease Mother    Alcohol abuse Father        and drug use, heroin, cocaine, marijuana    Migraines Maternal Aunt    Cancer Paternal Aunt        colon in 50s deceased   Asthma Maternal Grandmother    Diabetes Maternal Grandfather    Hyperlipidemia Other    Hypertension Other    Depression Other    Stroke Neg Hx    Family Psychiatric  History:  See H&P Social History:  Social History   Substance and Sexual Activity  Alcohol Use Not Currently   Alcohol/week: 2.0 - 5.0 standard drinks of alcohol   Types: 2 - 5 Cans of beer per week   Comment: 3-4 x week     Social History   Substance and Sexual Activity  Drug Use Not Currently   Types: Marijuana, Methamphetamines    Social History   Socioeconomic History   Marital status: Married    Spouse name: Not on file   Number of children: Not on file   Years of education: Not on file   Highest education level: Not on file  Occupational History   Not on file  Tobacco Use   Smoking status: Some Days    Current packs/day: 0.00    Average packs/day: 0.3 packs/day for 2.0 years (0.5 ttl pk-yrs)    Types: Cigars, Cigarettes    Start date: 10/27/2013    Last attempt to quit: 10/28/2015    Years since quitting: 8.0   Smokeless tobacco: Former    Types: Chew   Tobacco comments:    Pt declines information  Vaping Use   Vaping status: Former  Substance and Sexual Activity   Alcohol use: Not Currently    Alcohol/week: 2.0 - 5.0 standard drinks of alcohol    Types: 2 - 5 Cans of beer per week    Comment: 3-4 x week   Drug use: Not Currently    Types: Marijuana, Methamphetamines   Sexual activity: Yes  Other Topics Concern   Not on file  Social History Narrative   Lives in boyfriend, completing High school   No dietary restrictions has a history of binge eating.. Former smoker, no alcohol or drug use.   Social Drivers of Health   Financial Resource Strain: Medium Risk (11/26/2020)   [facility]   Overall Financial Resource Strain (CARDIA)     Difficulty of Paying Living Expenses: Somewhat hard  Food Insecurity: No Food Insecurity (11/06/2023)   Hunger Vital Sign    Worried About Running Out of Food in the Last Year: Never true    Ran Out of Food in the Last Year: Never true  Transportation Needs: No Transportation Needs (11/06/2023)   PRAPARE - Administrator, Civil Service (Medical): No    Lack  of Transportation (Non-Medical): No  Physical Activity: Insufficiently Active (11/26/2020)   [facility]   Exercise Vital Sign    Days of Exercise per Week: 3 days    Minutes of Exercise per Session: 40 min  Stress: Stress Concern Present (11/26/2020)   [facility]   [employer] of Occupational Health - Occupational Stress Questionnaire    Feeling of Stress : Very much  Social Connections: Unknown (03/07/2022)   [facility]   Social Network    Social Network: Not on file   Additional Social History:     Current Medications: Current Facility-Administered Medications  Medication Dose Route Frequency Provider Last Rate Last Admin   acetaminophen  (TYLENOL ) tablet 650 mg  650 mg Oral Q6H PRN Delsie Lynwood Morene Lavone, MD   650 mg at 11/07/23 0852   alum & mag hydroxide-simeth (MAALOX/MYLANTA) 200-200-20 MG/5ML suspension 30 mL  30 mL Oral Q4H PRN Delsie Lynwood Morene Lavone, MD       busPIRone  (BUSPAR ) tablet 5 mg  5 mg Oral TID Parker, Alvin S, MD   5 mg at 11/09/23 0745   chlorproMAZINE  (THORAZINE ) tablet 25 mg  25 mg Oral QID PRN Parker, Alvin S, MD   25 mg at 11/08/23 0630   Or   chlorproMAZINE  (THORAZINE ) injection 25 mg  25 mg Intramuscular QID PRN Parker, Alvin S, MD   25 mg at 11/09/23 9358   chlorproMAZINE  (THORAZINE ) tablet 50 mg  50 mg Oral QID PRN Kennyth Starleen RAMAN, MD       Or   chlorproMAZINE  (THORAZINE ) injection 50 mg  50 mg  Intramuscular QID PRN Kennyth Starleen RAMAN, MD       FLUoxetine  (PROZAC ) capsule 20 mg  20 mg Oral Daily Parker, Alvin S, MD   20 mg at 11/09/23 0745   gabapentin  (NEURONTIN ) capsule 100 mg  100 mg Oral TID Pashayan, Alexander S, MD   100 mg at 11/09/23 0745   hydrOXYzine  (ATARAX ) tablet 50 mg  50 mg Oral TID PRN Delsie Lynwood Morene Lavone, MD   50 mg at 11/08/23 1320   ibuprofen  (ADVIL ) tablet 600 mg  600 mg Oral Q6H PRN Parker, Alvin S, MD   600 mg at 11/07/23 2205   loperamide  (IMODIUM ) capsule 2 mg  2 mg Oral PRN Parker, Alvin S, MD       magnesium  hydroxide (MILK OF MAGNESIA) suspension 30 mL  30 mL Oral Daily PRN Delsie Lynwood Morene Lavone, MD       nicotine  (NICODERM CQ  - dosed in mg/24 hours) patch 21 mg  21 mg Transdermal Q0600 Delsie Lynwood Morene Lavone, MD   21 mg at 11/09/23 9357   QUEtiapine  (SEROQUEL ) tablet 400 mg  400 mg Oral QHS Parker, Alvin S, MD   400 mg at 11/08/23 2102   traZODone  (DESYREL ) tablet 100 mg  100 mg Oral QHS PRN Delsie Lynwood Morene Lavone, MD   100 mg at 11/08/23 2103    Lab Results: No results found for this or any previous visit (from the past 48 hours).  Blood Alcohol level:  Lab Results  Component Value Date   [facility] <10 11/05/2023   ETH <10 08/01/2019    Metabolic Disorder Labs: Lab Results  Component Value Date   HGBA1C 4.4 (L) 11/05/2023   MPG 79.58 11/05/2023   MPG 85.32 09/25/2018   Lab Results  Component Value Date   PROLACTIN 6.7 08/03/2013   Lab Results  Component Value Date   CHOL 161 11/05/2023   TRIG 95 11/05/2023   HDL 47 11/05/2023   CHOLHDL 3.4 11/05/2023   VLDL 19 11/05/2023   LDLCALC 95 11/05/2023   LDLCALC 198 (H) 09/26/2018    Physical Findings: AIMS:  , ,  ,  ,    CIWA:    COWS:     Musculoskeletal: Strength & Muscle Tone: within normal limits Gait & Station: normal Patient leans: N/A  Psychiatric Specialty Exam:  Presentation  General Appearance:  Casually dressed, not in any distress, was  interacting appropriately with a peer prior to interview.  Eye Contact: Good  Speech: Clear and Coherent  Speech Volume: Normal   Mood and Affect  Mood: Euthymic.  Affect: Full range and mood congruent.  Thought Process  Thought Processes: Normal speed of thought.  Linear and goal directed.  Descriptions of Associations:Intact  Orientation:Full (Time, Place and Person)  Thought Content: No delusional preoccupation.  No negative ruminations.  No obsessions.  No suicidal thoughts.  No homicidal thoughts.  No thoughts of violence.  Patient is future oriented.  Sensorium  Memory: Immediate Good; Remote Good  Judgment: Good.  Insight: Good.  Executive Functions  Concentration: Good  Attention Span: Good  Recall: Good  Fund of Knowledge: Good  Language: Good   Psychomotor Activity  Psychomotor Activity: Normal psychomotor activity.    Physical Exam: Physical Exam ROS Blood pressure 108/67, pulse 86, temperature 98.4 F (36.9 C), temperature source Oral, resp. rate 16, height 5' 7 (1.702 m), weight 85.2 kg, last menstrual period 10/17/2023, SpO2 99%, currently breastfeeding. Body mass index is 29.41 kg/m.   Treatment Plan Summary: Patient is tolerating her medications well.  She is responding appropriately to treatment.  Her mood is stabilizing appropriately.  No dangerousness.  She has good support in the community.  We will get collateral feedback from.  Hopeful discharge tomorrow if she is stable.  1 . Quetiapine  400 mg at bedtime. 2.  Fluoxetine  20 mg daily 3.  Buspirone  5 mg 3 times daily. 4.  Gabapentin  100 mg 3 times daily. 5.  Continue to monitor mood and behavior and interactions with others  6.  Continue to encourage unit groups and therapeutic activities. 7.  Social worker will get feedback from her husband. 8.  Social work will finalize aftercare discharge planning.   Jerrell DELENA Forehand, MD 11/09/2023, 10:01 AM

## 2023-11-09 NOTE — Progress Notes (Signed)
  [facility] Adult Case Management Discharge Plan :  Will you be returning to the same living situation after discharge:  Yes,  patient will return home with Zaineb Nowaczyk (husband) [phone] At discharge, do you have transportation home?: Yes,  Pearlina Friedly will pick her up at 7 AM.  Do you have the ability to pay for your medications: Yes,  patient has [facility].  Release of information consent forms completed and in the chart;  Patient's signature needed at discharge.  Patient to Follow up at:  Follow-up Information     [facility] Follow up.   Why: You have an appointment for medication management on Wednesday, February 5 at 4:10 PM.  This appointment will be held virtually. Contact information: [address] [place] [address] [phone]         [facility] Follow up.   Why: Please follow up with [facility] regarding your appointment. Contact information: [email or web address] [email or web address] Dr. Julietta                Next level of care provider has access to [facility] Link:no  Safety Planning and Suicide Prevention discussed: Yes,  Quetzally Callas (husband) [phone]   Has patient been referred to the Quitline?: Patient refused referral for treatment  Patient has been referred for addiction treatment: No known substance use disorder.  Graiden Henes O Kaaliyah Kita, LCSWA 11/09/2023, 3:56 PM

## 2023-11-09 NOTE — Progress Notes (Signed)
 Recreation Therapy Notes  INPATIENT RECREATION THERAPY ASSESSMENT  Patient Details Name: Misty Jimenez MRN: [id number] DOB: 06/21/1996 Today's Date: 11/09/2023       Information Obtained From: Patient  Able to Participate in Assessment/Interview: Yes  Patient Presentation: Alert  Reason for Admission (Per Patient): Other (Comments) (a lot of post partum and feeling like I'm not being a good mother)  Patient Stressors: Other (Comment) (felt I wasn't being a good mom)  Coping Skills:   Isolation, Journal, Meditate, Music, Deep Breathing, Exercise, Art, Talk, Prayer, Avoidance, Read, Hot Bath/Shower  Leisure Interests (2+):  Social - Family  Frequency of Recreation/Participation: Other (Comment) (Daily)  Awareness of Community Resources:  Yes  Community Resources:  Library, [employer]  Current Use: Yes  If no, Barriers?:    Expressed Interest in [employer] Information: No  [employer] of Residence:  Engineer, Technical Sales  Patient Main Form of Transportation: Set Designer  Patient Strengths:  Being a good mom, wife and person  Patient Identified Areas of Improvement:  Anger because of my past  Patient Goal for Hospitalization:  I think I've met it, getting the rignt meds and get stable  Current SI (including self-harm):  No  Current HI:  No  Current AVH: No  Staff Intervention Plan: Group Attendance, Collaborate with Interdisciplinary Treatment Team  Consent to Intern Participation: N/A   Hilliary Jock-McCall, LRT,CTRS Markus Casten A Jeshua Ransford-McCall 11/09/2023, 1:37 PM

## 2023-11-09 NOTE — [facility]HH Suicide Risk Assessment (Signed)
 [facility] Discharge Suicide Risk Assessment   Principal Problem: Bipolar 1 disorder, mixed, severe (HCC) Discharge Diagnoses: Principal Problem:   Bipolar 1 disorder, mixed, severe (HCC) Active Problems:   Suicidal ideation   Posttraumatic stress disorder   Borderline personality disorder (HCC)   Opioid use disorder, severe, in sustained remission (HCC)   Cocaine use disorder in remission   Amphetamine use disorder, moderate, in sustained remission (HCC)   Total Time spent with patient: 30 minutes  Musculoskeletal: Strength & Muscle Tone: within normal limits Gait & Station: normal Patient leans: Right and N/A  Psychiatric Specialty Exam  Presentation   Presentation  General Appearance:  Casually dressed, not in any distress, was interacting appropriately with a peer prior to interview.   Eye Contact: Good   Speech: Clear and Coherent   Speech Volume: Normal     Mood and Affect  Mood: Euthymic.   Affect: Full range and mood congruent.   Thought Process  Thought Processes: Normal speed of thought.  Linear and goal directed.   Descriptions of Associations:Intact   Orientation:Full (Time, Place and Person)   Thought Content: No delusional preoccupation.  No negative ruminations.  No obsessions.  No suicidal thoughts.  No homicidal thoughts.  No thoughts of violence.  Patient is future oriented.   Sensorium  Memory: Immediate Good; Remote Good   Judgment: Good.   Insight: Good.   Executive Functions  Concentration: Good   Attention Span: Good   Recall: Good   Fund of Knowledge: Good   Language: Good     Psychomotor Activity  Psychomotor Activity: Normal psychomotor activity.    Physical Exam: Physical Exam ROS Blood pressure 108/67, pulse 86, temperature 98.4 F (36.9 C), temperature source Oral, resp. rate 16, height 5' 7 (1.702 m), weight 85.2 kg, last menstrual period 10/17/2023, SpO2 99%, currently breastfeeding. Body mass index  is 29.41 kg/m.  Mental Status Per Nursing Assessment::   On Admission:  NA  Demographic Factors:  Caucasian  Loss Factors: NA  Historical Factors: Prior suicide attempts and Impulsivity  Risk Reduction Factors:   Responsible for children under 34 years of age, Sense of responsibility to family, Living with another person, especially a relative, Positive social support, Positive therapeutic relationship, and Positive coping skills or problem solving skills  Continued Clinical Symptoms:  Patient is back on psychotropic medication.  She is responding appropriately to treatment.  She looks forward to being with her family.  There is no evidence of mania.  No evidence of psychosis.  No cravings for any psychoactive substance.  She is tolerating her medicines without any side effects.  Cognitive Features That Contribute To Risk:  None    Suicide Risk:  Minimal: No identifiable suicidal ideation.  Patients presenting with no risk factors but with morbid ruminations; may be classified as minimal risk based on the severity of the depressive symptoms   Follow-up Information     [facility] Follow up.   Why: Your appointment is scheduled Contact information: [address] [place] [address] [phone]                 Plan Of Care/Follow-up recommendations:  See discharge and aftercare planning  Jerrell DELENA Forehand, MD 11/09/2023, 3:51 PM

## 2023-11-09 NOTE — Progress Notes (Signed)
 New patient visit   Patient: [place]   DOB: July 30, 1996   27 y.o. Female  MRN: [id number] Visit Date: 11/10/2023  Today's healthcare provider: Manuelita Flatness, PA-C   Cc. New patient, several concerns  Subjective    [place] L Wool[place] is a 28 y.o. female who presents today as a new patient to establish care.  Pt is a G1P1 female who was recently hospitalized for SI, substance use disorder, and bipolar 1. She was inpatient from 11/05/23-11/10/23, discharged today.  Discussed the use of AI scribe software for clinical note transcription with the patient, who gave verbal consent to proceed.  History of Present Illness   [place] presents today with her husband, concerned with continuous bleeding since giving birth approximately two months ago. The bleeding is accompanied by urinary symptoms, including cloudy urine, suggestive of a urinary tract infection. She also reports pain in her lower abdomen and back, which has been ongoing for a significant period. [place] has been experiencing issues with her eyes, including bulging and twitching, and has had difficulty with bowel movements. She recently underwent a stay in a behavioral health unit and is currently on several new medications. [place] is interested in breastfeeding but has concerns about the safety of her medications in relation to breastfeeding. She would prefer to breastfeed and states she will stop all her medicines if needed.   She was discharged fro behavioral health this morning, and has not taken her medication yet today, or picked them up from the pharmacy.   She also complains of neck pain and instability, headaches.  Her and her husband currently live with her grandparents, who are currently watching their infant.      Past Medical History:  Diagnosis Date   Abdominal pain, chronic, right lower quadrant 08/03/2013   Abdominal pain, recurrent    With Headache   Acute pyelonephritis 05/07/2016   kidney surgery at 80  months of age   Acute respiratory failure with hypoxia (HCC) 08/24/2023   ADHD (attention deficit hyperactivity disorder)    Allergy    Anxiety    Bipolar affective disorder, current episode manic with psychotic symptoms (HCC) 02/28/2021   I was so depressed after losing my job, I was thinking of cutting myself.     Bulimia 04/17/2023   Cannabis hyperemesis syndrome concurrent with and due to cannabis abuse (HCC) 10/12/2020   Closed fracture of distal end of left radius 06/19/2016   Current smoker 10/28/2018   Depression    Endometriosis    Endometritis 07/23/2017   Family history of adverse reaction to anesthesia    grandmother gets PONV   GE reflux 12/16/2011   Hyperlipidemia    Kidney disease 05/07/2016   Left breast abscess 12/18/2021   LGSIL on Pap smear of cervix 04/13/2023   03/2023 LSIL - PAP in 1 year     Moderate cannabis use disorder (HCC) 10/28/2018   Obesity 05/07/2016   PCOS (polycystic ovarian syndrome)    Preventative health care 11/04/2016   Reflux, vesicoureteral    Respiratory illness with fever 12/27/2019   Wrist pain, left 11/04/2016   Past Surgical History:  Procedure Laterality Date   KIDNEY SURGERY     As a small child   TYMPANOSTOMY TUBE PLACEMENT     URETER SURGERY     WRIST SURGERY Left 05/2016   10 pins and 2 plates   Family Status  Relation Name Status   Mother Alan GRUMET Alive   Father Venetia ROSALEE Searles  Mat Aunt  (Not Specified)   Pat Aunt 20s Deceased   MGM 65 Alive   MGF 69 Alive   PGM 64 Alive   PGF unknown Alive   Other  (Not Specified)   Neg Hx  (Not Specified)  No partnership data on file   Family History  Problem Relation Age of Onset   Kidney disease Mother    Alcohol abuse Father        and drug use, heroin, cocaine, marijuana   Migraines Maternal Aunt    Cancer Paternal Aunt        colon in 60s deceased   Asthma Maternal Grandmother    Diabetes Maternal Grandfather    Hyperlipidemia Other    Hypertension Other     Depression Other    Stroke Neg Hx    Social History   Socioeconomic History   Marital status: Married    Spouse name: Not on file   Number of children: Not on file   Years of education: Not on file   Highest education level: Not on file  Occupational History   Not on file  Tobacco Use   Smoking status: Some Days    Current packs/day: 0.00    Average packs/day: 0.3 packs/day for 2.0 years (0.5 ttl pk-yrs)    Types: Cigars, Cigarettes    Start date: 10/27/2013    Last attempt to quit: 10/28/2015    Years since quitting: 8.0   Smokeless tobacco: Former    Types: Chew   Tobacco comments:    Pt declines information  Vaping Use   Vaping status: Former  Substance and Sexual Activity   Alcohol use: Not Currently    Alcohol/week: 2.0 - 5.0 standard drinks of alcohol    Types: 2 - 5 Cans of beer per week    Comment: 3-4 x week   Drug use: Not Currently    Types: Marijuana, Methamphetamines   Sexual activity: Yes  Other Topics Concern   Not on file  Social History Narrative   Lives in boyfriend, completing High school   No dietary restrictions has a history of binge eating.. Former smoker, no alcohol or drug use.   Social Drivers of Health   Financial Resource Strain: Medium Risk (11/26/2020)   [facility]   Overall Financial Resource Strain (CARDIA)    Difficulty of Paying Living Expenses: Somewhat hard  Food Insecurity: No Food Insecurity (11/06/2023)   Hunger Vital Sign    Worried About Running Out of Food in the Last Year: Never true    Ran Out of Food in the Last Year: Never true  Transportation Needs: No Transportation Needs (11/06/2023)   PRAPARE - Administrator, Civil Service (Medical): No    Lack of Transportation (Non-Medical): No  Physical Activity: Insufficiently Active (11/26/2020)   [facility]   Exercise Vital  Sign    Days of Exercise per Week: 3 days    Minutes of Exercise per Session: 40 min  Stress: Stress Concern Present (11/26/2020)   [facility]   [employer] of Occupational Health - Occupational Stress Questionnaire    Feeling of Stress : Very much  Social Connections: Unknown (03/07/2022)   [facility], [facility]   Social Network    Social Network: Not on file   Outpatient  Medications Prior to Visit  Medication Sig   busPIRone  (BUSPAR ) 5 MG tablet Take 1 tablet (5 mg total) by mouth 3 (three) times daily.   FLUoxetine  (PROZAC ) 20 MG capsule Take 1 capsule (20 mg total) by mouth daily.   gabapentin  (NEURONTIN ) 100 MG capsule Take 1 capsule (100 mg total) by mouth 3 (three) times daily.   nicotine  (NICODERM CQ  - DOSED IN MG/24 HOURS) 21 mg/24hr patch Place 1 patch (21 mg total) onto the skin daily at 6 (six) AM.   QUEtiapine  (SEROQUEL ) 400 MG tablet Take 1 tablet (400 mg total) by mouth at bedtime.   No facility-administered medications prior to visit.   Allergies  Allergen Reactions   Sulfa Antibiotics Hives and Rash   Sulfasalazine Hives   Amphetamines Other (See Comments)    Reports different parts of her body melt and causes hallucinations.   Other Other (See Comments)    Fiberglass cast caused rash and hives   Monosodium Glutamate Nausea And Vomiting, Rash and Other (See Comments)    Headache and dizziness    Immunization History  Administered Date(s) Administered   DTaP 07/04/1998, 08/29/1998, 11/14/1998, 10/30/1999, 05/10/2002   H1N1 09/07/2008   HIB (PRP-OMP) 07/04/1998, 08/29/1998, 10/30/1999   HPV Quadrivalent 05/30/2009, 06/03/2010, 06/11/2011   Hepatitis A 05/12/2006, 05/14/2007   Hepatitis B 11/14/1998, 02/06/1999, 05/10/1999   IPV 07/04/1998, 08/29/1998, 10/30/1999, 05/10/2002   Influenza Nasal 09/07/2008, 05/30/2009, 06/03/2010   Influenza Split 09/18/2006    Influenza, Seasonal, Injecte, Preservative Fre 08/18/2014, 09/04/2023   Influenza,inj,Quad PF,6+ Mos 11/04/2016, 01/18/2018, 08/17/2018, 11/11/2018, 07/12/2019, 09/08/2019   MMR 10/30/1999, 05/10/2002   Meningococcal Conjugate 05/30/2009, 07/12/2014   PPD Test 11/01/2018, 04/03/2021   Pneumococcal Conjugate-13 01/01/2000, 03/24/2000   Td 05/30/2009   Tdap 05/30/2009, 07/12/2019, 06/10/2023   Varicella 05/10/1999, 05/12/2006    Health Maintenance  Topic Date Due   Pneumococcal Vaccine 78-87 Years old (1 of 1 - PPSV23 or PCV20) 05/07/2004   COVID-19 Vaccine (1 - 2024-25 season) Never done   Cervical Cancer Screening (Pap smear)  04/07/2024   DTaP/Tdap/Td (10 - Td or Tdap) 06/09/2033   INFLUENZA VACCINE  Completed   HPV VACCINES  Completed   Hepatitis C Screening  Completed   HIV Screening  Completed    Patient Care Team: Cyndi Shaver, PA-C as PCP - General (Physician Assistant) Barbra Lang PARAS, DO as PCP - OBGYN (Family Medicine) Susen Elsie DEL, MD (Inactive) as Attending Physician (Neurology)  Review of Systems  Constitutional:  Negative for fatigue and fever.  Respiratory:  Negative for cough and shortness of breath.   Cardiovascular:  Negative for chest pain and leg swelling.  Gastrointestinal:  Positive for abdominal pain and nausea.  Genitourinary:  Positive for dysuria, flank pain and vaginal bleeding.  Neurological:  Negative for dizziness and headaches.        Objective    BP 106/71   Pulse 75   Temp 97.8 F (36.6 C) (Oral)   Ht 5' 7.25 (1.708 m)   Wt 184 lb 2 oz (83.5 kg)   LMP 10/17/2023 (Exact Date) Comment: pt sts she has had vaginal bleeding since giving birth 09/02/23  SpO2 97%   BMI 28.62 kg/m     Physical Exam Constitutional:      General: She is awake.     Appearance: She is well-developed.  HENT:     Head: Normocephalic.  Eyes:     Conjunctiva/sclera: Conjunctivae normal.  Cardiovascular:     Rate and Rhythm: Normal rate and  regular rhythm.     Heart sounds: Normal heart sounds.  Pulmonary:     Effort: Pulmonary effort is normal.     Breath sounds: Normal breath sounds.  Abdominal:     General: Abdomen is flat.     Palpations: Abdomen is soft.     Tenderness: There is abdominal tenderness.  Skin:    General: Skin is warm.  Neurological:     Mental Status: She is alert and oriented to person, place, and time.  Psychiatric:        Attention and Perception: Attention normal.        Mood and Affect: Mood normal.        Speech: Speech normal.        Behavior: Behavior is cooperative.    Depression Screen    11/10/2023    8:33 AM 04/08/2023    8:48 AM 11/04/2016    9:57 AM  PHQ 2/9 Scores  PHQ - 2 Score 1 5 2   PHQ- 9 Score  19    Results for orders placed or performed in visit on 11/10/23  POCT urinalysis dipstick  Result Value Ref Range   Color, UA red (A) yellow   Clarity, UA cloudy (A) clear   Glucose, UA negative negative mg/dL   Bilirubin, UA moderate (A) negative   Ketones, POC UA trace (5) (A) negative mg/dL   Spec Grav, UA 8.974 8.989 - 1.025   Blood, UA large (A) negative   pH, UA 6.0 5.0 - 8.0   Protein Ur, POC =100 (A) negative mg/dL   Urobilinogen, UA 0.2 0.2 or 1.0 E.U./dL   Nitrite, UA Positive (A) Negative   Leukocytes, UA Small (1+) (A) Negative  Results for orders placed or performed during the hospital encounter of 11/06/23  Folate  Result Value Ref Range   Folate 12.7 >5.9 ng/mL  Vitamin B12  Result Value Ref Range   Vitamin B-12 426 180 - 914 pg/mL  VITAMIN D  25 Hydroxy (Vit-D Deficiency, Fractures)  Result Value Ref Range   Vit D, 25-Hydroxy 33.52 30 - 100 ng/mL    Assessment & Plan     Bipolar 1 disorder, mixed, severe (HCC) Assessment & Plan: Patient was discharged with: Quetiapine  400 mg at bedtime Floxetine 20 mg daily Gabapentin  100 mg tid Buspar  5 mg tid   I reviewed each med w/ Lactmed, all are present in breastmilk -- side effects to infant are  sedation, GI upset. No solid research on buspar . Advised pt as such, she states breastfeeding was not discussed by psych inpatient. She has an upcoming OB appointment.  I advised patient until discussing with OB, may be best to pump and dump. She stated she would rather be 'unhealthy' and stop her meds, and still breastfeed.   Advised that is not a good decision, best for baby and her for her to stay medicated. Pt agrees. She states she will monitor infant for changes.  She has upcoming appt virtually with psychiatry.    Abnormal vaginal bleeding Assessment & Plan: Pt reports constant vaginal bleeding, either spotting or clots, since she gave birth 09/02/23. She has appt later this mo with OB.   Will check cbc, iron, if anemia is progressing, would recommend an earlier appt with OB  Pelvic US  12/24 did not confirm or deny retained products on conception  Orders: -     CBC with Differential/Platelet -     IBC + Ferritin -     Comprehensive metabolic panel  Acute cystitis with hematuria Urine is bright red.  UA + nitrites, blood, bacteria.  Unable to determine if bleeding is urinary given patient's reported large amount of vaginal bleeding  Will tx as UTI, sending for culture.  Would recommend repeat UA/culture/urine micro after abx course. -     POCT urinalysis dipstick -     Urine Culture -     Cephalexin ; Take 1 capsule (500 mg total) by mouth 2 (two) times daily for 5 days.  Dispense: 10 capsule; Refill: 0  Bulging eyes Assessment & Plan: Per pt. Will check tsh/t4  Orders: -     TSH -     T4, free   Return if symptoms worsen or fail to improve.      Manuelita Flatness, PA-C  [facility] [phone] (phone) [phone] (fax)  [facility]

## 2023-11-10 ENCOUNTER — Telehealth: Payer: Self-pay | Admitting: Physician Assistant

## 2023-11-10 ENCOUNTER — Telehealth: Payer: Self-pay

## 2023-11-10 ENCOUNTER — Ambulatory Visit: Payer: MEDICAID | Admitting: Physician Assistant

## 2023-11-10 VITALS — BP 106/71 | HR 75 | Temp 97.8°F | Ht 67.25 in | Wt 184.1 lb

## 2023-11-10 DIAGNOSIS — N3001 Acute cystitis with hematuria: Secondary | ICD-10-CM | POA: Diagnosis not present

## 2023-11-10 DIAGNOSIS — F3163 Bipolar disorder, current episode mixed, severe, without psychotic features: Secondary | ICD-10-CM

## 2023-11-10 DIAGNOSIS — N939 Abnormal uterine and vaginal bleeding, unspecified: Secondary | ICD-10-CM | POA: Insufficient documentation

## 2023-11-10 DIAGNOSIS — H0520 Unspecified exophthalmos: Secondary | ICD-10-CM

## 2023-11-10 LAB — CBC WITH DIFFERENTIAL/PLATELET
Basophils Absolute: 0 10*3/uL (ref 0.0–0.1)
Basophils Relative: 0.5 % (ref 0.0–3.0)
Eosinophils Absolute: 0.1 10*3/uL (ref 0.0–0.7)
Eosinophils Relative: 0.8 % (ref 0.0–5.0)
HCT: 38.3 % (ref 36.0–46.0)
Hemoglobin: 12.8 g/dL (ref 12.0–15.0)
Lymphocytes Relative: 21.1 % (ref 12.0–46.0)
Lymphs Abs: 1.9 10*3/uL (ref 0.7–4.0)
MCHC: 33.3 g/dL (ref 30.0–36.0)
MCV: 93.1 fL (ref 78.0–100.0)
Monocytes Absolute: 0.5 10*3/uL (ref 0.1–1.0)
Monocytes Relative: 6.2 % (ref 3.0–12.0)
Neutro Abs: 6.3 10*3/uL (ref 1.4–7.7)
Neutrophils Relative %: 71.4 % (ref 43.0–77.0)
Platelets: 377 10*3/uL (ref 150.0–400.0)
RBC: 4.12 Mil/uL (ref 3.87–5.11)
RDW: 12.3 % (ref 11.5–15.5)
WBC: 8.8 10*3/uL (ref 4.0–10.5)

## 2023-11-10 LAB — POCT URINALYSIS DIP (MANUAL ENTRY)
Glucose, UA: NEGATIVE mg/dL
Nitrite, UA: POSITIVE — AB
Protein Ur, POC: 100 mg/dL — AB
Spec Grav, UA: 1.025 (ref 1.010–1.025)
Urobilinogen, UA: 0.2 U/dL
pH, UA: 6 (ref 5.0–8.0)

## 2023-11-10 LAB — IBC + FERRITIN
Ferritin: 30.2 ng/mL (ref 10.0–291.0)
Iron: 56 ug/dL (ref 42–145)
Saturation Ratios: 16.9 % — ABNORMAL LOW (ref 20.0–50.0)
TIBC: 330.4 ug/dL (ref 250.0–450.0)
Transferrin: 236 mg/dL (ref 212.0–360.0)

## 2023-11-10 LAB — COMPREHENSIVE METABOLIC PANEL
ALT: 10 U/L (ref 0–35)
AST: 19 U/L (ref 0–37)
Albumin: 4.4 g/dL (ref 3.5–5.2)
Alkaline Phosphatase: 68 U/L (ref 39–117)
BUN: 9 mg/dL (ref 6–23)
CO2: 28 meq/L (ref 19–32)
Calcium: 9.2 mg/dL (ref 8.4–10.5)
Chloride: 103 meq/L (ref 96–112)
Creatinine, Ser: 0.89 mg/dL (ref 0.40–1.20)
GFR: 90.05 mL/min (ref >60.00)
Glucose, Bld: 79 mg/dL (ref 70–99)
Potassium: 3.7 meq/L (ref 3.5–5.1)
Sodium: 141 meq/L (ref 135–145)
Total Bilirubin: 0.6 mg/dL (ref 0.2–1.2)
Total Protein: 6.4 g/dL (ref 6.0–8.3)

## 2023-11-10 LAB — TSH: TSH: 2.07 u[IU]/mL (ref 0.35–5.50)

## 2023-11-10 LAB — T4, FREE: Free T4: 0.85 ng/dL (ref 0.60–1.60)

## 2023-11-10 MED ORDER — CEPHALEXIN 500 MG PO CAPS: 500.0000 mg | ORAL_CAPSULE | Freq: Two times a day (BID) | ORAL | 0 refills | Status: DC

## 2023-11-10 NOTE — Telephone Encounter (Signed)
 Pt advised she did not sign the DPR for our office and will need to come back to be able to use this as a release form.

## 2023-11-10 NOTE — Assessment & Plan Note (Signed)
 Pt reports constant vaginal bleeding, either spotting or clots, since she gave birth 09/02/23. She has appt later this mo with OB.   Will check cbc, iron, if anemia is progressing, would recommend an earlier appt with OB  Pelvic US  12/24 did not confirm or deny retained products on conception

## 2023-11-10 NOTE — Plan of Care (Signed)
  Problem: Self-Concept: Goal: Will verbalize positive feelings about self Outcome: Progressing   Problem: Self-Concept: Goal: Ability to identify factors that promote anxiety will improve Outcome: Progressing Goal: Level of anxiety will decrease Outcome: Progressing

## 2023-11-10 NOTE — Telephone Encounter (Signed)
 Copied from CRM [id number]. Topic: General - Other >> Nov 10, 2023 11:20 AM Misty Jimenez wrote: Reason for CRM: Patient was seen in the office today and prescribed cephALEXin  (KEFLEX ) 500 MG capsule ; however, the pharmacy is not fillinf the prescription because of her insurance. Patient is requesting we call the pharmacy and provide insurance information.

## 2023-11-10 NOTE — Patient Instructions (Signed)
 Why: You have an appointment for medication management on Wednesday, February 5 at 4:10 PM.  This appointment will be held virtually. Contact information: [address] [place] [address] [phone]

## 2023-11-10 NOTE — Plan of Care (Signed)
  Problem: Education: Goal: Knowledge of [facility] Education information/materials will improve Outcome: Adequate for Discharge Goal: Emotional status will improve Outcome: Adequate for Discharge Goal: Mental status will improve Outcome: Adequate for Discharge Goal: Verbalization of understanding the information provided will improve Outcome: Adequate for Discharge   Problem: Health Behavior/Discharge Planning: Goal: Identification of resources available to assist in meeting health care needs will improve Outcome: Adequate for Discharge   Problem: Safety: Goal: Periods of time without injury will increase Outcome: Adequate for Discharge

## 2023-11-10 NOTE — Telephone Encounter (Signed)
 Called patient to let them know to call insurance company and try to find out which pharmacy they prefer using. Patient should be calling back with information

## 2023-11-10 NOTE — Assessment & Plan Note (Signed)
 Per pt. Will check tsh/t4

## 2023-11-10 NOTE — Assessment & Plan Note (Signed)
 Patient was discharged with: Quetiapine  400 mg at bedtime Floxetine 20 mg daily Gabapentin  100 mg tid Buspar  5 mg tid   I reviewed each med w/ Lactmed, all are present in breastmilk -- side effects to infant are sedation, GI upset. No solid research on buspar . Advised pt as such, she states breastfeeding was not discussed by psych inpatient. She has an upcoming OB appointment.  I advised patient until discussing with OB, may be best to pump and dump. She stated she would rather be 'unhealthy' and stop her meds, and still breastfeed.   Advised that is not a good decision, best for baby and her for her to stay medicated. Pt agrees.   She has upcoming appt virtually with psychiatry.

## 2023-11-10 NOTE — Progress Notes (Signed)
 Nurs Dischg Note:     D:  Patient denies SI/HI/AVH at this time. Pt appears calm and cooperative, and no distress noted.   A: All Personal items in locker returned to pt. Pt given AVS/ SRA / BH Transition/ Pt escorted to the lobby where husband was present to pick her up.  Pt verbalized they will be enroute to 0805 outpatient appt.    R:  Pt States she will comply with outpatient/ other services, and take MEDS as prescribed.

## 2023-11-10 NOTE — Progress Notes (Addendum)
 Pt awoke at 0410 for gatorade.  No distress observed/reported.  Pt reported feeling dizzy after shower.  Guided pt to sit for awhile and she complied.  Pt vomited and this writer noted minimal blood in toilet.  Thorazine  and Tylenol  given this am   11/09/23 2300  Psych Admission Type (Psych Patients Only)  Admission Status Voluntary  Psychosocial Assessment  Patient Complaints Anxiety  Eye Contact Fair  Facial Expression Pained;Other (Comment) (Smiling)  Affect Other (Comment) (Congruent with thoughts)  Speech Logical/coherent  Interaction Assertive  Appearance/Hygiene Unremarkable  Behavior Characteristics Cooperative;Appropriate to situation  Mood Preoccupied;Other (Comment) (I miss my baby)  Thought Process  Coherency WDL  Content Other (Comment) (Focused on discharge)  Delusions None reported or observed  Perception WDL  Hallucination None reported or observed  Judgment Impaired  Confusion None  Danger to Self  Current suicidal ideation? Denies  Agreement Not to Harm Self Yes  Description of Agreement Verbal  Danger to Others  Danger to Others None reported or observed

## 2023-11-11 ENCOUNTER — Other Ambulatory Visit: Payer: Self-pay

## 2023-11-11 ENCOUNTER — Encounter (HOSPITAL_BASED_OUTPATIENT_CLINIC_OR_DEPARTMENT_OTHER): Payer: Self-pay | Admitting: Emergency Medicine

## 2023-11-11 DIAGNOSIS — R300 Dysuria: Secondary | ICD-10-CM | POA: Insufficient documentation

## 2023-11-11 DIAGNOSIS — Z1152 Encounter for screening for COVID-19: Secondary | ICD-10-CM | POA: Diagnosis not present

## 2023-11-11 DIAGNOSIS — J029 Acute pharyngitis, unspecified: Secondary | ICD-10-CM | POA: Insufficient documentation

## 2023-11-11 LAB — URINALYSIS, ROUTINE W REFLEX MICROSCOPIC
Bilirubin Urine: NEGATIVE
Glucose, UA: NEGATIVE mg/dL
Ketones, ur: NEGATIVE mg/dL
Nitrite: NEGATIVE
Protein, ur: 100 mg/dL — AB
Specific Gravity, Urine: >=1.03 (ref 1.005–1.030)
pH: 5.5 (ref 5.0–8.0)

## 2023-11-11 LAB — URINALYSIS, MICROSCOPIC (REFLEX)

## 2023-11-11 LAB — GROUP A STREP BY PCR: Group A Strep by PCR: NOT DETECTED

## 2023-11-11 LAB — RESP PANEL BY RT-PCR (RSV, FLU A&B, COVID)  RVPGX2
Influenza A by PCR: NEGATIVE
Influenza B by PCR: NEGATIVE
Resp Syncytial Virus by PCR: NEGATIVE
SARS Coronavirus 2 by RT PCR: NEGATIVE

## 2023-11-11 NOTE — ED Triage Notes (Signed)
 Pt reports sore throat and dysuria; sts she has abx rx for UTI, but she is unable to pick it up because Medicaid does not cover it

## 2023-11-12 ENCOUNTER — Telehealth: Payer: Self-pay | Admitting: Physician Assistant

## 2023-11-12 ENCOUNTER — Emergency Department (HOSPITAL_BASED_OUTPATIENT_CLINIC_OR_DEPARTMENT_OTHER)
Admission: EM | Admit: 2023-11-12 | Discharge: 2023-11-12 | Disposition: A | Discharge status: Home / Self Care | Payer: MEDICAID | Attending: Emergency Medicine | Admitting: Emergency Medicine

## 2023-11-12 DIAGNOSIS — J029 Acute pharyngitis, unspecified: Secondary | ICD-10-CM

## 2023-11-12 DIAGNOSIS — R300 Dysuria: Secondary | ICD-10-CM

## 2023-11-12 DIAGNOSIS — N3001 Acute cystitis with hematuria: Secondary | ICD-10-CM

## 2023-11-12 LAB — URINE CULTURE
MICRO NUMBER:: 15952636
SPECIMEN QUALITY:: ADEQUATE

## 2023-11-12 MED ORDER — CEPHALEXIN 250 MG PO CAPS
500.0000 mg | ORAL_CAPSULE | Freq: Once | ORAL | Status: AC
Start: 2023-11-12 — End: 2023-11-12
  Administered 2023-11-12: 500 mg via ORAL
  Filled 2023-11-12: qty 2

## 2023-11-12 MED ORDER — CEPHALEXIN 500 MG PO CAPS: 500.0000 mg | ORAL_CAPSULE | Freq: Two times a day (BID) | ORAL | 0 refills | Status: AC

## 2023-11-12 NOTE — Discharge Summary (Signed)
 Physician Discharge Summary Note  Patient:  Misty Jimenez is an 28 y.o., female MRN:  [id number] DOB:  Oct 28, 1995 Patient phone:  [phone] (home)  Patient address:   [address] [place] [address],  Total Time spent with patient: 30 minutes  Date of Admission:  11/06/2023 Date of Discharge: 11/10/2023  Reason for Admission:   28 year old Caucasian female, married, lives with her husband.  Recently had a baby.  Had not been adherent with her medication while she was pregnant.  Postpartum she has relapsed into alcohol abuse and gambling.  She presented to the emergency room in company of her husband.  Patient presented with mixed affective symptoms. Routine labs were essentially normal.  UDS was positive for THC.   Principal Problem: Bipolar 1 disorder, mixed, severe (HCC) Discharge Diagnoses: Principal Problem:   Bipolar 1 disorder, mixed, severe (HCC) Active Problems:   Suicidal ideation   Posttraumatic stress disorder   Borderline personality disorder (HCC)   Opioid use disorder, severe, in sustained remission (HCC)   Cocaine use disorder in remission   Amphetamine use disorder, moderate, in sustained remission [facility])   Past Psychiatric History:  Bipolar disorder and substance use disorder  Past Medical History:  Past Medical History:  Diagnosis Date   Abdominal pain, chronic, right lower quadrant 08/03/2013   Abdominal pain, recurrent    With Headache   Acute pyelonephritis 05/07/2016   kidney surgery at 48 months of age   Acute respiratory failure with hypoxia (HCC) 08/24/2023   ADHD (attention deficit hyperactivity disorder)    Allergy    Anxiety    Bipolar affective disorder, current episode manic with psychotic symptoms (HCC) 02/28/2021   I was so depressed after losing my job, I was thinking of cutting myself.     Bulimia 04/17/2023   Cannabis hyperemesis syndrome concurrent with and due to cannabis abuse (HCC) 10/12/2020   Closed fracture of  distal end of left radius 06/19/2016   Current smoker 10/28/2018   Depression    Endometriosis    Endometritis 07/23/2017   Family history of adverse reaction to anesthesia    grandmother gets PONV   GE reflux 12/16/2011   Hyperlipidemia    Kidney disease 05/07/2016   Left breast abscess 12/18/2021   LGSIL on Pap smear of cervix 04/13/2023   03/2023 LSIL - PAP in 1 year     Moderate cannabis use disorder (HCC) 10/28/2018   Obesity 05/07/2016   PCOS (polycystic ovarian syndrome)    Preventative health care 11/04/2016   Reflux, vesicoureteral    Respiratory illness with fever 12/27/2019   Wrist pain, left 11/04/2016    Past Surgical History:  Procedure Laterality Date   KIDNEY SURGERY     As a small child   TYMPANOSTOMY TUBE PLACEMENT     URETER SURGERY     WRIST SURGERY Left 05/2016   10 pins and 2 plates   Family History:  Family History  Problem Relation Age of Onset   Kidney disease Mother    Alcohol abuse Father        and drug use, heroin, cocaine, marijuana   Migraines Maternal Aunt    Cancer Paternal Aunt        colon in 49s deceased   Asthma Maternal Grandmother    Diabetes Maternal Grandfather    Hyperlipidemia Other    Hypertension Other    Depression Other    Stroke Neg Hx    Family Psychiatric  History:  Social History:  Social History   Substance and Sexual Activity  Alcohol Use Not Currently   Alcohol/week: 2.0 - 5.0 standard drinks of alcohol   Types: 2 - 5 Cans of beer per week   Comment: 3-4 x week     Social History   Substance and Sexual Activity  Drug Use Not Currently   Types: Marijuana, Methamphetamines    Social History   Socioeconomic History   Marital status: Married    Spouse name: Not on file   Number of children: Not on file   Years of education: Not on file   Highest education level: Not on file  Occupational History   Not on file  Tobacco Use   Smoking status: Some Days    Current packs/day: 0.00    Average  packs/day: 0.3 packs/day for 2.0 years (0.5 ttl pk-yrs)    Types: Cigars, Cigarettes    Start date: 10/27/2013    Last attempt to quit: 10/28/2015    Years since quitting: 8.0   Smokeless tobacco: Former    Types: Chew   Tobacco comments:    Pt declines information  Vaping Use   Vaping status: Former  Substance and Sexual Activity   Alcohol use: Not Currently    Alcohol/week: 2.0 - 5.0 standard drinks of alcohol    Types: 2 - 5 Cans of beer per week    Comment: 3-4 x week   Drug use: Not Currently    Types: Marijuana, Methamphetamines   Sexual activity: Yes  Other Topics Concern   Not on file  Social History Narrative   Lives in boyfriend, completing High school   No dietary restrictions has a history of binge eating.. Former smoker, no alcohol or drug use.   Social Drivers of Health   Financial Resource Strain: Medium Risk (11/26/2020)   [facility]   Overall Financial Resource Strain (CARDIA)    Difficulty of Paying Living Expenses: Somewhat hard  Food Insecurity: No Food Insecurity (11/06/2023)   Hunger Vital Sign    Worried About Running Out of Food in the Last Year: Never true    Ran Out of Food in the Last Year: Never true  Transportation Needs: No Transportation Needs (11/06/2023)   PRAPARE - Administrator, Civil Service (Medical): No    Lack of Transportation (Non-Medical): No  Physical Activity: Insufficiently Active (11/26/2020)   [facility]   Exercise Vital Sign    Days of Exercise per Week: 3 days    Minutes of Exercise per Session: 40 min  Stress: Stress Concern Present (11/26/2020)   [facility]   [employer] of Occupational Health - Occupational Stress Questionnaire    Feeling of Stress : Very much  Social Connections: Unknown  (03/07/2022)   [facility]   Social Network    Social Network: Not on file    Hospital Course:   Patient was admitted on CIWA protocol and suicide precaution.  She detox from psychoactive substances smoothly.  She was started on her antidepressant or mood stabilizer.  The patient tolerated her medication well and responded well to treatment.  Patient however has been spotting since she had a baby.  She complained of colicky abdominal pain which radiates to her back.  She had seen her OB/GYN with limited benefit.  We  recommended nonsteroidal anti-inflammatory and Tylenol  while she was here.  I have encouraged her to go back to her OB/GYN so that they can explore and see if she has any left tissue in her uterus.  Patient was slightly dramatic during her hospital stay.  She did not require any restraints during her hospital stay.  Her family was very supportive.  Sleep-wake cycle regularized.  Other biological functions regularized.  Patient did not exhibit any psychotic symptoms.  She did not express any self-injurious thoughts or violent thoughts towards others. Patient and team agrees that she is back to her baseline.  Team agrees with discharge today.  Physical Findings: AIMS:  , ,  ,  ,    CIWA:    COWS:     Musculoskeletal: Strength & Muscle Tone: within normal limits Gait & Station: normal Patient leans: N/A   Psychiatric Specialty Exam:  General Appearance:  Casually dressed, not in any distress, was interacting appropriately with a peer prior to interview.   Eye Contact: Good   Speech: Clear and Coherent   Speech Volume: Normal     Mood and Affect  Mood: Euthymic.   Affect: Full range and mood congruent.   Thought Process  Thought Processes: Normal speed of thought.  Linear and goal directed.   Descriptions of Associations:Intact   Orientation:Full (Time, Place and Person)   Thought Content: No delusional preoccupation.  No  negative ruminations.  No obsessions.  No suicidal thoughts.  No homicidal thoughts.  No thoughts of violence.  Patient is future oriented.   Sensorium  Memory: Immediate Good; Remote Good   Judgment: Good.   Insight: Good.   Executive Functions  Concentration: Good   Attention Span: Good   Recall: Good   Fund of Knowledge: Good   Language: Good     Psychomotor Activity  Psychomotor Activity: Normal psychomotor activity.   Physical Exam: Physical Exam ROS Blood pressure (!) 150/91, pulse 67, temperature 98.4 F (36.9 C), temperature source Oral, resp. rate 16, height 5' 7 (1.702 m), weight 85.2 kg, last menstrual period 10/17/2023, SpO2 99%, currently breastfeeding. Body mass index is 29.41 kg/m.   Social History   Tobacco Use  Smoking Status Some Days   Current packs/day: 0.00   Average packs/day: 0.3 packs/day for 2.0 years (0.5 ttl pk-yrs)   Types: Cigars, Cigarettes   Start date: 10/27/2013   Last attempt to quit: 10/28/2015   Years since quitting: 8.0  Smokeless Tobacco Former   Types: Chew  Tobacco Comments   Pt declines information   Tobacco Cessation:  N/A, patient does not currently use tobacco products   Blood Alcohol level:  Lab Results  Component Value Date   ETH <10 11/05/2023   ETH <10 08/01/2019    Metabolic Disorder Labs:  Lab Results  Component Value Date   HGBA1C 4.4 (L) 11/05/2023   MPG 79.58 11/05/2023   MPG 85.32 09/25/2018   Lab Results  Component Value Date   PROLACTIN 6.7 08/03/2013   Lab Results  Component Value Date   CHOL 161 11/05/2023   TRIG 95 11/05/2023   HDL 47 11/05/2023   CHOLHDL 3.4 11/05/2023   VLDL 19 11/05/2023   LDLCALC 95 11/05/2023   LDLCALC 198 (H) 09/26/2018    See Psychiatric Specialty Exam and Suicide Risk Assessment completed by Attending Physician prior to discharge.  Discharge destination:  Home  Is patient on multiple antipsychotic therapies at discharge:  No   Has Patient had  three or more  failed trials of antipsychotic monotherapy by history:  No  Recommended Plan for Multiple Antipsychotic Therapies: NA  Discharge Instructions     Diet - low sodium heart healthy   Complete by: As directed    Increase activity slowly   Complete by: As directed       Allergies as of 11/10/2023       Reactions   Sulfa Antibiotics Hives, Rash   Sulfasalazine Hives   Amphetamines Other (See Comments)   Reports different parts of her body melt and causes hallucinations.   Other Other (See Comments)   Fiberglass cast caused rash and hives   Monosodium Glutamate Nausea And Vomiting, Rash, Other (See Comments)   Headache and dizziness        Medication List     STOP taking these medications    albuterol  108 (90 Base) MCG/ACT inhaler Commonly known as: VENTOLIN  HFA   amoxicillin -clavulanate 875-125 MG tablet Commonly known as: AUGMENTIN        TAKE these medications      Indication  busPIRone  5 MG tablet Commonly known as: BUSPAR  Take 1 tablet (5 mg total) by mouth 3 (three) times daily.  Indication: Major Depressive Disorder   FLUoxetine  20 MG capsule Commonly known as: PROZAC  Take 1 capsule (20 mg total) by mouth daily. What changed:  medication strength how much to take  Indication: Major Depressive Disorder   gabapentin  100 MG capsule Commonly known as: NEURONTIN  Take 1 capsule (100 mg total) by mouth 3 (three) times daily.  Indication: Generalized Anxiety Disorder   nicotine  21 mg/24hr patch Commonly known as: NICODERM CQ  - dosed in mg/24 hours Place 1 patch (21 mg total) onto the skin daily at 6 (six) AM.  Indication: Nicotine  Addiction   QUEtiapine  400 MG tablet Commonly known as: SEROQUEL  Take 1 tablet (400 mg total) by mouth at bedtime. What changed:  medication strength how much to take  Indication: Manic-Depression        Follow-up Information     [facility] Follow up.   Why: You have an appointment for  medication management on Wednesday, February 5 at 4:10 PM.  This appointment will be held virtually. Contact information: [address] [place] [address] [phone]         [facility] Follow up.   Why: Please follow up with [facility] regarding your appointment. Contact information: [email or web address] Dr. Julietta                Follow-up recommendations:   Patient will stay on her current regimen and follow up as recommended.  There are no restrictions to activity about her diet.  Signed: Jerrell DELENA Forehand, MD 11/12/2023, 3:08 PM

## 2023-11-12 NOTE — Telephone Encounter (Signed)
Put in a call to [facility] DSS --was told I'd get a call back as it is after hours Plan on reporting-- Pt was seen in ED last night and it was reported to ED provider that she had taken oxycodone  Pt was recently d/c from inpt psych and does not want to taker her meds,  She has a 2 mo old infant she is actively breastfeeding

## 2023-11-12 NOTE — ED Provider Notes (Signed)
[place] [facility]  Provider Note  CSN: [id number] Arrival date & time: 11/11/23 2159  History Chief Complaint  Patient presents with   Sore Throat    Misty Jimenez is a 28 y.o. female with history of bipolar disorder and substance use disorder is about 2 months post-partum. Recently admitted to [facility] for SI and substance abuse, discharged on 1/14. Went to see new PCP that day and was complaining of urinary symptoms. UA there was concerning for UTI and she was prescribed Keflex which she states Medicaid wouldn't cover. She has also had a sore throat today, husband diagnosed with strep. No fever.    Home Medications Prior to Admission medications   Medication Sig Start Date End Date Taking? Authorizing Provider  busPIRone (BUSPAR) 5 MG tablet Take 1 tablet (5 mg total) by mouth 3 (three) times daily. 11/09/23   Izediuno, Delight Ovens, MD  cephALEXin (KEFLEX) 500 MG capsule Take 1 capsule (500 mg total) by mouth 2 (two) times daily for 5 days. 11/12/23 11/17/23  Pollyann Savoy, MD  FLUoxetine (PROZAC) 20 MG capsule Take 1 capsule (20 mg total) by mouth daily. 11/10/23   Izediuno, Delight Ovens, MD  gabapentin (NEURONTIN) 100 MG capsule Take 1 capsule (100 mg total) by mouth 3 (three) times daily. 11/09/23   Izediuno, Delight Ovens, MD  nicotine (NICODERM CQ - DOSED IN MG/24 HOURS) 21 mg/24hr patch Place 1 patch (21 mg total) onto the skin daily at 6 (six) AM. 11/10/23   Izediuno, Delight Ovens, MD  QUEtiapine (SEROQUEL) 400 MG tablet Take 1 tablet (400 mg total) by mouth at bedtime. 11/09/23   Georgiann Cocker, MD     Allergies    Sulfa antibiotics, Sulfasalazine, Amphetamines, Other, and Monosodium glutamate   Review of Systems   Review of Systems Please see HPI for pertinent positives and negatives  Physical Exam BP (!) 140/109 (BP Location: Right Arm)   Pulse 98   Temp 98.6 F (37 C)   Resp 18   Ht 5' 7.25" (1.708 m)   Wt 83.5 kg   LMP 10/17/2023  (Exact Date) Comment: pt sts she has had vaginal bleeding since giving birth 09/02/23  SpO2 99%   BMI 28.60 kg/m   Physical Exam Vitals and nursing note reviewed.  Constitutional:      Appearance: Normal appearance.  HENT:     Head: Normocephalic and atraumatic.     Nose: Nose normal.     Mouth/Throat:     Mouth: Mucous membranes are moist.     Pharynx: Posterior oropharyngeal erythema present. No oropharyngeal exudate.     Tonsils: No tonsillar exudate.  Eyes:     Extraocular Movements: Extraocular movements intact.     Conjunctiva/sclera: Conjunctivae normal.  Cardiovascular:     Rate and Rhythm: Normal rate.  Pulmonary:     Effort: Pulmonary effort is normal.     Breath sounds: Normal breath sounds.  Abdominal:     General: Abdomen is flat.     Palpations: Abdomen is soft.     Tenderness: There is no abdominal tenderness.  Musculoskeletal:        General: No swelling. Normal range of motion.     Cervical back: Neck supple.  Skin:    General: Skin is warm and dry.  Neurological:     General: No focal deficit present.     Mental Status: She is alert.  Psychiatric:        Mood  and Affect: Mood normal.     ED Results / Procedures / Treatments   EKG None  Procedures Procedures  Medications Ordered in the ED Medications  cephALEXin (KEFLEX) capsule 500 mg (has no administration in time range)    Initial Impression and Plan  Patient here with sore throat, Covid/Flu/RSV and strep swabs negative. Also having some urinary symptoms, UA is equivocal for infection. Unclear why Keflex would not be covered, but will give her a paper prescription so she can find a different pharmacy to fill. She stopped taking her Psych meds because she wanted to breastfeed. Also reported to me she had taken some oxycodone today. She was strongly encouraged to take the medications prescribed to her and to bottle feed her infant.    ED Course       MDM Rules/Calculators/A&P Medical  Decision Making Problems Addressed: Dysuria: acute illness or injury Sore throat: acute illness or injury  Amount and/or Complexity of Data Reviewed Labs: ordered. Decision-making details documented in ED Course.  Risk Prescription drug management.     Final Clinical Impression(s) / ED Diagnoses Final diagnoses:  Dysuria  Sore throat    Rx / DC Orders ED Discharge Orders          Ordered    cephALEXin (KEFLEX) 500 MG capsule  2 times daily        11/12/23 0045             Pollyann Savoy, MD 11/12/23 [phone]

## 2023-11-13 ENCOUNTER — Ambulatory Visit: Payer: Self-pay | Admitting: Physician Assistant

## 2023-11-13 NOTE — Telephone Encounter (Signed)
Copied from CRM [id number]. Topic: Clinical - Red Word Triage >> Nov 13, 2023 12:19 PM Shelbie Proctor wrote: Red Word that prompted transfer to Nurse Triage: Patient [phone] states she has no voice, throat extreme pain, fever, burping eggs but has not eaten egges, patient is already on antibiotics for bladder infection. Patient's husband has strep throat, patient was tested for strep and it's negative but it might've been too early.  Chief Complaint: GI distress Symptoms: NVD, vomiting blood, fever, sore throat, headache Frequency: Ongoing Pertinent Negatives: Patient denies relief Disposition: [x] ED /[] Urgent Care (no appt availability in office) / [] Appointment(In office/virtual)/ []  [facility]/ [] Home Care/ [x] Refused Recommended Disposition /[] [facility]/ []  Follow-up with PCP Additional Notes: Patient called in to report a variety of GI symptoms. Patient reported severe vomiting and diarrhea. Patient reported she is experiencing sharp pain in her abdomen. Patient also reported vomiting blood, fever, and headache. Patient reported that she cannot keep fluids down. Patient stated she currently has strep throat and a bladder infection. Patient stated she has been vomiting since last January, but has also been pregnant in the last year. Patient is currently 2 months post partum. Patient is convinced that she has "Giardia" after she researched her symptoms on WebMD. Advised ED. Patient refused disposition and explained that she really wanted to be seen in the office because she has a newborn at home. This RN advised patient that I would route this conversation to the clinic for their discretion. Strongly encouraged ED, if she does not hear from the clinic. Patient complied and said she would go to the ED if she does not hear from the clinic in the next hour. This RN notified [facility] of refused disposition.   Reason for Disposition  [1] SEVERE vomiting (e.g., 6 or more times/day) AND  [2] present > 8 hours (Exception: Patient sounds well, is drinking liquids, does not sound dehydrated, and vomiting has lasted less than 24 hours.)  Answer Assessment - Initial Assessment Questions 1. VOMITING SEVERITY: "How many times have you vomited in the past 24 hours?"     - MILD:  1 - 2 times/day    - MODERATE: 3 - 5 times/day, decreased oral intake without significant weight loss or symptoms of dehydration    - SEVERE: 6 or more times/day, vomits everything or nearly everything, with significant weight loss, symptoms of dehydration      States she has vomited "over 29 times" today  2. ONSET: "When did the vomiting begin?"      January of last year  3. FLUIDS: "What fluids or food have you vomited up today?" "Have you been able to keep any fluids down?"     States she cannot keep any fluids down  4. ABDOMEN PAIN: "Are your having any abdomen pain?" If Yes : "How bad is it and what does it feel like?" (e.g., crampy, dull, intermittent, constant)      States abdomen is very sore, pain is sharp all over  5. DIARRHEA: "Is there any diarrhea?" If Yes, ask: "How many times today?"      Yes  6. CONTACTS: "Is there anyone else in the family with the same symptoms?"      Husband has strep throat at home, but denies GI symptoms  7. CAUSE: "What do you think is causing your vomiting?"     States she has symptoms of "Giardia" after a Google search  8. HYDRATION STATUS: "Any signs of dehydration?" (e.g., dry mouth [not  only dry lips], too weak to stand) "When did you last urinate?"     States she is having a hard time drinking fluids  9. OTHER SYMPTOMS: "Do you have any other symptoms?" (e.g., fever, headache, vertigo, vomiting blood or coffee grounds, recent head injury)     Current strep throat, current bladder infection, significant weight loss, fever, headache, vomiting blood  10. PREGNANCY: "Is there any chance you are pregnant?" "When was your last menstrual period?"       2 months  post partum  Protocols used: Vomiting-A-AH

## 2023-11-13 NOTE — Telephone Encounter (Signed)
Was able to get in contact yesterday evening w/ social work & left report

## 2023-11-13 NOTE — Telephone Encounter (Signed)
Tried calling patient to let her know she will needed to go to ED as we do not have any appointments today.  Patient VM is full so I could not leave a message-  Number on file is also husbands number and there is no DPR filled out to leave VM or speak to husband

## 2023-11-13 NOTE — Telephone Encounter (Signed)
Chief Complaint: Anxiety/Panic Attack Symptoms: unable to calm down, skin inflamed Pertinent Negatives: Patient denies SI, HI Disposition: [x] ED /[] Urgent Care (no appt availability in office) / [] Appointment(In office/virtual)/ []  [place] [facility]/ [] Home Care/ [] Refused Recommended Disposition /[]  Mobile Bus/ []  Follow-up with PCP Additional Notes: patient called with concerns for continuous panic attacks. Patient is with her husband who helps throughout triage. Patient is very keyed up while on the phone-gave history of multiple ED visits. Patient endorses panic attacks are coming in waves and that she is having pain along with feeling like her skin is inflamed. Per protocol, patient is instructed to go to the ED. Patient was given advice on how to manage her visit. Patient was relatively calmer by the end of the call. Patient verbalized understanding of the plan and all questions answered.        Copied from CRM [id number]. Topic: Clinical - Red Word Triage >> Nov 13, 2023  4:20 PM Irine Seal wrote: [facility] that prompted transfer to Nurse Triage: more than a week ago pt went to the doctor for haemorrhagia and pain that resulted in anemia, 2 months post partum, at the hospital she was given ativan and morphine at the hospital without her consent which resulted in relapsing. She is not suicidal but she is very distraught, and in the middle of a panic attack that comes in waves, skin is inflamed in pain and uncomfortable. Pt was dx with bladder infection and possible kidney infection- pt does not want pain medication she is frustrated that noone will listen to her and treats her as "drug seeking" . Pt has a lot of current medical issues taking place. She needs medical advice and is frantic. Pt wants to be heard and helped Reason for Disposition  [1] SEVERE anxiety (e.g., extremely anxious with intense emotional symptoms such as feeling of unreality, urge to flee, unable to calm down;  unable to cope or function) AND [2] not better after 10 minutes of reassurance and Care Advice  Answer Assessment - Initial Assessment Questions 1. CONCERN: "Did anything happen that prompted you to call today?"      Patient with psych hx-off of her medication due to breastfeeding-severe anxiety 2. ANXIETY SYMPTOMS: "Can you describe how you (your loved one; patient) have been feeling?" (e.g., tense, restless, panicky, anxious, keyed up, overwhelmed, sense of impending doom).      Anxious, panicky, keyed up 3. ONSET: "How long have you been feeling this way?" (e.g., hours, days, weeks)     On and off si 4. SEVERITY: "How would you rate the level of anxiety?" (e.g., 0 - 10; or mild, moderate, severe).     severe 5. FUNCTIONAL IMPAIRMENT: "How have these feelings affected your ability to do daily activities?" "Have you had more difficulty than usual doing your normal daily activities?" (e.g., getting better, same, worse; self-care, school, work, interactions)     No 6. HISTORY: "Have you felt this way before?" "Have you ever been diagnosed with an anxiety problem in the past?" (e.g., generalized anxiety disorder, panic attacks, PTSD). If Yes, ask: "How was this problem treated?" (e.g., medicines, counseling, etc.)     Yes-treated with medication 7. RISK OF HARM - SUICIDAL IDEATION: "Do you ever have thoughts of hurting or killing yourself?" If Yes, ask:  "Do you have these feelings now?" "Do you have a plan on how you would do this?"     no 8. TREATMENT:  "What has been done so far to  treat this anxiety?" (e.g., medicines, relaxation strategies). "What has helped?"     medications 9. TREATMENT - THERAPIST: "Do you have a counselor or therapist? Name?"     no 10. POTENTIAL TRIGGERS: "Do you drink caffeinated beverages (e.g., coffee, colas, teas), and how much daily?" "Do you drink alcohol or use any drugs?" "Have you started any new medicines recently?"       No 11. PATIENT SUPPORT: "Who is with  you now?" "Who do you live with?" "Do you have family or friends who you can talk to?"        Husband 12. OTHER SYMPTOMS: "Do you have any other symptoms?" (e.g., feeling depressed, trouble concentrating, trouble sleeping, trouble breathing, palpitations or fast heartbeat, chest pain, sweating, nausea, or diarrhea)       No 13. PREGNANCY: "Is there any chance you are pregnant?" "When was your last menstrual period?"       10/17/2023  Protocols used: Anxiety and Panic Attack-A-AH

## 2023-11-15 ENCOUNTER — Ambulatory Visit (HOSPITAL_COMMUNITY)
Admission: EM | Admit: 2023-11-15 | Discharge: 2023-11-15 | Disposition: A | Discharge status: Home / Self Care | Payer: MEDICAID
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
## 2023-11-15 NOTE — ED Notes (Signed)
Patient left AMA.
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
## 2023-11-16 NOTE — Telephone Encounter (Signed)
Agree with ed dispo

## 2023-11-17 ENCOUNTER — Ambulatory Visit: Payer: MEDICAID | Admitting: Advanced Practice Midwife

## 2023-11-18 ENCOUNTER — Telehealth: Payer: Self-pay | Admitting: Physician Assistant

## 2023-11-18 NOTE — Telephone Encounter (Signed)
Patient is aware of scheduled appointment times/dates for New Patient appointment;patient is also aware of arrival times, location, and also was given my callback number if needed for rescheduling

## 2023-11-24 ENCOUNTER — Ambulatory Visit: Payer: Self-pay | Admitting: Physician Assistant

## 2023-11-24 ENCOUNTER — Ambulatory Visit: Payer: MEDICAID | Admitting: Urgent Care

## 2023-11-24 NOTE — Telephone Encounter (Signed)
Copied from CRM [id number]. Topic: Clinical - Red Word Triage >> Nov 24, 2023 11:23 AM Louie Boston wrote: Red Word that prompted transfer to Nurse Triage: Swelling in Legs   Chief Complaint: Bruising  Symptoms: Bruising and swelling (both legs), body aches, vaginal bleeding, intermittent dizziness (none now) Disposition: [] ED /[] Urgent Care (no appt availability in office) / [x] Appointment(In office/virtual)/ []  [place] [facility]/ [] Home Care/ [] Refused Recommended Disposition /[] [place] [facility]/ []  Follow-up with PCP  Additional Notes:  Patient stated that she has bruising and swelling in both legs which has been ongoing since November. Patient also having persistent vaginal bleeding. Patient reported worsening body aches. Body is hurting all over. Appointment scheduled this afternoon.   Reason for Disposition  [1] After 3 weeks AND [2] bruise still present  Answer Assessment - Initial Assessment Questions 1. APPEARANCE of BRUISE: "Describe the bruise."      Purple, green, yellow, and black bruises all over legs  2. SIZE: "How large is the bruise?"      Some are quarter size, some 3-4 inches, some 6 inches  3. NUMBER: "How many bruises are there?"      15 on one leg and 22 on other  4. LOCATION: "Where is the bruise located?"      Both legs  5. ONSET: "How long ago did the bruise occur?"      Since November   6. CAUSE: "Tell me how it happened."     Unknown   7. OTHER SYMPTOMS: "Do you have any other symptoms?"  (e.g., weakness, dizziness, pain, fever, nosebleed, blood in urine/stool)     Leg swelling, Body aching, occasional dizziness (none now)  Answer Assessment - Initial Assessment Questions 1. AMOUNT: "Describe the bleeding that you are having."    - SPOTTING: spotting, or pinkish / brownish mucous discharge; does not fill panty liner or pad    - MILD:  less than 1 pad / hour; less than patient's usual menstrual bleeding   - MODERATE: 1-2 pads / hour; 1 menstrual  cup every 6 hours; small-medium blood clots (e.g., pea, grape, small coin)   - SEVERE: soaking 2 or more pads/hour for 2 or more hours; 1 menstrual cup every 2 hours; bleeding not contained by pads or continuous red blood from vagina; large blood clots (e.g., golf ball, large coin)      Patient stated she is wearing a diaper today and it was saturated in 3 hours  2. ONSET: "When did the bleeding begin?" "Is it continuing now?"     Since November. It resolved for one week and then it has continued since then  3. REGULARITY: "How regular are your periods?"     Hx PCOS and endometriosis, hx of irregular periods but they were not this heavy in the past  4. ABDOMEN PAIN: "Do you have any pain?" "How bad is the pain?"  (e.g., Scale 1-10; mild, moderate, or severe)   - MILD (1-3): doesn't interfere with normal activities, abdomen soft and not tender to touch    - MODERATE (4-7): interferes with normal activities or awakens from sleep, abdomen tender to touch    - SEVERE (8-10): excruciating pain, doubled over, unable to do any normal activities     Yes.  Hx PCOS and endometriosis, very painful   5. BLOOD THINNER MEDICINES: "Do you take any blood thinners?" (e.g., Coumadin / warfarin, Pradaxa / dabigatran, aspirin)     No  6. HEMODYNAMIC STATUS: "Are you weak or feeling  lightheaded?" If Yes, ask: "Can you stand and walk normally?"        At times wobbly and has fallen before. Not dizzy today.  7. OTHER SYMPTOMS: "What other symptoms are you having with the bleeding?" (e.g., passed tissue, vaginal discharge, fever, menstrual-type cramps)      Some clots about the size of ping pong ball  Protocols used: Bruises-A-AH, Vaginal Bleeding - Abnormal-A-AH

## 2023-11-25 ENCOUNTER — Encounter: Payer: Self-pay | Admitting: Physician Assistant

## 2023-11-25 ENCOUNTER — Ambulatory Visit (HOSPITAL_BASED_OUTPATIENT_CLINIC_OR_DEPARTMENT_OTHER)
Admission: RE | Admit: 2023-11-25 | Discharge: 2023-11-25 | Disposition: A | Discharge status: Home / Self Care | Payer: MEDICAID | Source: Ambulatory Visit | Attending: Physician Assistant | Admitting: Physician Assistant

## 2023-11-25 ENCOUNTER — Telehealth: Payer: Self-pay | Admitting: Physician Assistant

## 2023-11-25 ENCOUNTER — Ambulatory Visit (INDEPENDENT_AMBULATORY_CARE_PROVIDER_SITE_OTHER): Payer: MEDICAID | Admitting: Physician Assistant

## 2023-11-25 VITALS — BP 126/89 | HR 76 | Temp 98.2°F | Ht 67.25 in | Wt 181.1 lb

## 2023-11-25 DIAGNOSIS — N939 Abnormal uterine and vaginal bleeding, unspecified: Secondary | ICD-10-CM | POA: Insufficient documentation

## 2023-11-25 DIAGNOSIS — T148XXA Other injury of unspecified body region, initial encounter: Secondary | ICD-10-CM | POA: Diagnosis not present

## 2023-11-25 LAB — PROTIME-INR
INR: 1 {ratio} (ref 0.8–1.0)
Prothrombin Time: 10.7 s (ref 9.6–13.1)

## 2023-11-25 LAB — APTT: aPTT: 29.6 s (ref 25.4–36.8)

## 2023-11-25 NOTE — Patient Instructions (Addendum)
Call OB for an appointment: [phone]

## 2023-11-25 NOTE — Telephone Encounter (Signed)
Called to advise pt the appt she has today at 2 is an imaging appt and not able to be virtual. She stated she already had her appt with her pcp and wanted to change it to virtual yesterday. Tried to advise pt maybe there was a miscommunication because based on the message it was regarding her appt today at 2. Pt seemed to very agitated on the phone and stated there is always a problem and she gets the "run around" at [place] and disconnected the call.

## 2023-11-25 NOTE — Telephone Encounter (Signed)
Copied from CRM [id number]. Topic: Appointments - Scheduling Inquiry for Clinic >> Nov 24, 2023 12:25 PM Corin V wrote: Reason for CRM: Patient has 2:00 appointment at clinic and her transport just cancelled on her. It is an acute visit, but patient was wanting to see if it can be a video visit so she can still receive assistance. CAL was closed for lunch when agent called. Please call patient back to confirm a video visit or to reschedule appointment.
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
## 2023-11-27 ENCOUNTER — Emergency Department (HOSPITAL_BASED_OUTPATIENT_CLINIC_OR_DEPARTMENT_OTHER)
Admission: EM | Admit: 2023-11-27 | Discharge: 2023-11-27 | Disposition: A | Discharge status: Home / Self Care | Payer: MEDICAID | Attending: Emergency Medicine | Admitting: Emergency Medicine

## 2023-11-27 ENCOUNTER — Ambulatory Visit: Payer: MEDICAID | Admitting: Physician Assistant

## 2023-11-27 ENCOUNTER — Other Ambulatory Visit: Payer: Self-pay

## 2023-11-27 ENCOUNTER — Ambulatory Visit: Payer: Self-pay | Admitting: Physician Assistant

## 2023-11-27 ENCOUNTER — Emergency Department (HOSPITAL_BASED_OUTPATIENT_CLINIC_OR_DEPARTMENT_OTHER): Payer: MEDICAID

## 2023-11-27 ENCOUNTER — Encounter (HOSPITAL_BASED_OUTPATIENT_CLINIC_OR_DEPARTMENT_OTHER): Payer: Self-pay | Admitting: Urology

## 2023-11-27 DIAGNOSIS — R109 Unspecified abdominal pain: Secondary | ICD-10-CM | POA: Diagnosis present

## 2023-11-27 DIAGNOSIS — N939 Abnormal uterine and vaginal bleeding, unspecified: Secondary | ICD-10-CM | POA: Diagnosis not present

## 2023-11-27 DIAGNOSIS — R1084 Generalized abdominal pain: Secondary | ICD-10-CM

## 2023-11-27 LAB — CBC WITH DIFFERENTIAL/PLATELET
Abs Immature Granulocytes: 0.01 10*3/uL (ref 0.00–0.07)
Basophils Absolute: 0 10*3/uL (ref 0.0–0.1)
Basophils Relative: 1 %
Eosinophils Absolute: 0.1 10*3/uL (ref 0.0–0.5)
Eosinophils Relative: 2 %
HCT: 36.3 % (ref 36.0–46.0)
Hemoglobin: 12.1 g/dL (ref 12.0–15.0)
Immature Granulocytes: 0 %
Lymphocytes Relative: 33 %
Lymphs Abs: 2.2 10*3/uL (ref 0.7–4.0)
MCH: 30 pg (ref 26.0–34.0)
MCHC: 33.3 g/dL (ref 30.0–36.0)
MCV: 90.1 fL (ref 80.0–100.0)
Monocytes Absolute: 0.4 10*3/uL (ref 0.1–1.0)
Monocytes Relative: 6 %
Neutro Abs: 3.9 10*3/uL (ref 1.7–7.7)
Neutrophils Relative %: 58 %
Platelets: 350 10*3/uL (ref 150–400)
RBC: 4.03 MIL/uL (ref 3.87–5.11)
RDW: 12.3 % (ref 11.5–15.5)
WBC: 6.7 10*3/uL (ref 4.0–10.5)
nRBC: 0 % (ref 0.0–0.2)

## 2023-11-27 LAB — HCG, QUANTITATIVE, PREGNANCY: hCG, Beta Chain, Quant, S: <1 m[IU]/mL (ref <5)

## 2023-11-27 LAB — URINALYSIS, ROUTINE W REFLEX MICROSCOPIC
Bilirubin Urine: NEGATIVE
Glucose, UA: NEGATIVE mg/dL
Ketones, ur: NEGATIVE mg/dL
Leukocytes,Ua: NEGATIVE
Nitrite: NEGATIVE
Protein, ur: 100 mg/dL — AB
Specific Gravity, Urine: 1.02 (ref 1.005–1.030)
pH: 7 (ref 5.0–8.0)

## 2023-11-27 LAB — RAPID URINE DRUG SCREEN, HOSP PERFORMED
Amphetamines: NOT DETECTED
Barbiturates: NOT DETECTED
Benzodiazepines: NOT DETECTED
Cocaine: NOT DETECTED
Opiates: NOT DETECTED
Tetrahydrocannabinol: POSITIVE — AB

## 2023-11-27 LAB — URINALYSIS, MICROSCOPIC (REFLEX)

## 2023-11-27 LAB — COMPREHENSIVE METABOLIC PANEL
ALT: 10 U/L (ref 0–44)
AST: 22 U/L (ref 15–41)
Albumin: 3.6 g/dL (ref 3.5–5.0)
Alkaline Phosphatase: 60 U/L (ref 38–126)
Anion gap: 8 (ref 5–15)
BUN: 13 mg/dL (ref 6–20)
CO2: 22 mmol/L (ref 22–32)
Calcium: 8.8 mg/dL — ABNORMAL LOW (ref 8.9–10.3)
Chloride: 107 mmol/L (ref 98–111)
Creatinine, Ser: 0.66 mg/dL (ref 0.44–1.00)
GFR, Estimated: >60 mL/min (ref >60)
Glucose, Bld: 80 mg/dL (ref 70–99)
Potassium: 3.6 mmol/L (ref 3.5–5.1)
Sodium: 137 mmol/L (ref 135–145)
Total Bilirubin: 0.7 mg/dL (ref 0.0–1.2)
Total Protein: 6.3 g/dL — ABNORMAL LOW (ref 6.5–8.1)

## 2023-11-27 LAB — TROPONIN I (HIGH SENSITIVITY): Troponin I (High Sensitivity): 3 ng/L (ref <18)

## 2023-11-27 MED ORDER — KETOROLAC TROMETHAMINE 15 MG/ML IJ SOLN
15.0000 mg | Freq: Once | INTRAMUSCULAR | Status: AC
  Administered 2023-11-27: 15 mg via INTRAVENOUS

## 2023-11-27 MED ORDER — ACETAMINOPHEN 500 MG PO TABS
1000.0000 mg | ORAL_TABLET | Freq: Once | ORAL | Status: AC
  Administered 2023-11-27: 1000 mg via ORAL
  Filled 2023-11-27: qty 2

## 2023-11-27 MED ORDER — KETOROLAC TROMETHAMINE 15 MG/ML IJ SOLN
15.0000 mg | Freq: Once | INTRAMUSCULAR | Status: DC
  Filled 2023-11-27: qty 1

## 2023-11-27 NOTE — Discharge Instructions (Addendum)
Please read and follow all provided instructions.  Your diagnoses today include:  1. Abnormal uterine bleeding (AUB)   2. Generalized abdominal pain     Tests performed today include: Blood cell counts and platelets Kidney and liver function tests Pancreas function test (called lipase) Urine test to look for infection/Drug screen A blood or urine test for pregnancy (women only) Cardiac enzyme test: Was negative for heart inflammation Vital signs. See below for your results today.   Medications prescribed:  Please use over-the-counter NSAID medications (ibuprofen, naproxen) or Tylenol (acetaminophen) as directed on the packaging for pain -- as long as you do not have any reasons avoid these medications. Reasons to avoid NSAID medications include: weak kidneys, a history of bleeding in your stomach or gut, or uncontrolled high blood pressure or previous heart attack. Reasons to avoid Tylenol include: liver problems or ongoing alcohol use. Never take more than 4000mg  or 8 Extra strength Tylenol in a 24 hour period.     Take any prescribed medications only as directed.  Home care instructions:  Follow any educational materials contained in this packet.  Follow-up instructions: Please follow-up with your primary care provider in the next 3 days for further evaluation of your symptoms.    Return instructions:  SEEK IMMEDIATE MEDICAL ATTENTION IF: The pain does not go away or becomes severe  A temperature above 101F develops  Repeated vomiting occurs (multiple episodes)  The pain becomes localized to portions of the abdomen. The right side could possibly be appendicitis. In an adult, the left lower portion of the abdomen could be colitis or diverticulitis.  Blood is being passed in stools or vomit (bright red or black tarry stools)  You develop chest pain, difficulty breathing, dizziness or fainting, or become confused, poorly responsive, or inconsolable (young children) If you have any  other emergent concerns regarding your health  Additional Information: Abdominal (belly) pain can be caused by many things. Your caregiver performed an examination and possibly ordered blood/urine tests and imaging (CT scan, x-rays, ultrasound). Many cases can be observed and treated at home after initial evaluation in the emergency department. Even though you are being discharged home, abdominal pain can be unpredictable. Therefore, you need a repeated exam if your pain does not resolve, returns, or worsens. Most patients with abdominal pain don't have to be admitted to the hospital or have surgery, but serious problems like appendicitis and gallbladder attacks can start out as nonspecific pain. Many abdominal conditions cannot be diagnosed in one visit, so follow-up evaluations are very important.  Your vital signs today were: BP 114/87 (BP Location: Left Arm)   Pulse (!) 58   Temp 97.6 F (36.4 C) (Oral)   Resp 20   Ht 5\' 8"  (1.727 m)   Wt 82.1 kg   LMP 10/17/2023 (Exact Date) Comment: pt sts she has had vaginal bleeding since giving birth 09/02/23  SpO2 98%   Breastfeeding No   BMI 27.52 kg/m  If your blood pressure (bp) was elevated above 135/85 this visit, please have this repeated by your doctor within one month. --------------

## 2023-11-27 NOTE — ED Triage Notes (Signed)
Per EMS, postpartum on 11/6, vaginal bleeding since  Vaginal birth  Tenderness to abdomen  VS wnl   Pt states confusion, got lost going home  Pt states "heart rate has been all over the place"

## 2023-11-27 NOTE — ED Notes (Signed)
Pt called out, Reports pain medication is not working. EDP notified

## 2023-11-27 NOTE — Telephone Encounter (Signed)
Copied from CRM [id number]. Topic: Clinical - Red Word Triage >> Nov 27, 2023 12:28 PM Alcus Dad H wrote: Red Word that prompted transfer to Nurse Triage: Patient said she thinks she might have lupus, experiencing swelling in the legs, blood in her urine, really bad headaches, occasional rashes and she just started experiencing really bad pain in the chest.   Chief Complaint: Appointment Request Symptoms: Nausea, Vomiting, Chills, Rashes, Hair Loss, Hematuria, Joint Pain, Chest Pain Frequency: Acute Disposition: [x] ED /[] Urgent Care (no appt availability in office) / [] Appointment(In office/virtual)/ []  [facility]/ [] Home Care/ [] Refused Recommended Disposition /[] [place]/ [x]  Follow-up with PCP Additional Notes: DH is a 28 year old who is currently in the ER at [facility]. The patient reports chest pain and is waiting to be evaluated or has been and told to obtain a PCP appointment with her provider. The patient reports a number of symptoms and is adamant about seeing her provider today. The patient is anxious and suspects she has Lupus. Advised the patient to remain in the ED for evaluation due to the presentation and explanation of symptoms. Due to adamancy of wanting an appointment, made patient a late in office appointment for today.    Reason for Disposition  [1] Chest pain (or "angina") comes and goes AND [2] is happening more often (increasing in frequency) or getting worse (increasing in severity)  (Exception: Chest pains that last only a few seconds.)  Answer Assessment - Initial Assessment Questions 1. LOCATION: "Where does it hurt?"       Left Chest Wall  2. RADIATION: "Does the pain go anywhere else?" (e.g., into neck, jaw, arms, back)     No  3. ONSET: "When did the chest pain begin?" (Minutes, hours or days)      Acute  4. PATTERN: "Does the pain come and go, or has it been constant since it started?"  "Does it get worse with exertion?"       Constant  5. DURATION: "How long does it last" (e.g., seconds, minutes, hours)     Hours 6. SEVERITY: "How bad is the pain?"  (e.g., Scale 1-10; mild, moderate, or severe)    - MILD (1-3): doesn't interfere with normal activities     - MODERATE (4-7): interferes with normal activities or awakens from sleep    - SEVERE (8-10): excruciating pain, unable to do any normal activities       Severe  7. CARDIAC RISK FACTORS: "Do you have any history of heart problems or risk factors for heart disease?" (e.g., angina, prior heart attack; diabetes, high blood pressure, high cholesterol, smoker, or strong family history of heart disease)     No  8. PULMONARY RISK FACTORS: "Do you have any history of lung disease?"  (e.g., blood clots in lung, asthma, emphysema, birth control pills)     No  9. CAUSE: "What do you think is causing the chest pain?"     Unsure  10. OTHER SYMPTOMS: "Do you have any other symptoms?" (e.g., dizziness, nausea, vomiting, sweating, fever, difficulty breathing, cough)       Nausea, Vomiting, Chills, Rashes, Hair Loss, Hematuria, Joint Pain, Chest Pain  11. PREGNANCY: "Is there any chance you are pregnant?" "When was your last menstrual period?"       No  Protocols used: Chest Pain-A-AH

## 2023-11-27 NOTE — ED Notes (Signed)
 Patient transported to X-ray

## 2023-11-27 NOTE — ED Provider Notes (Signed)
[facility] AT [facility] Provider Note   CSN: [id number] Arrival date & time: 11/27/23  1003     History  Chief Complaint  Patient presents with   Vaginal Bleeding    Misty Jimenez is a 28 y.o. female.  Patient with substance abuse history, bipolar disorder, psychiatric admission earlier this month (1/10-1/15) --presents to the emergency department today by EMS for evaluation of abdominal pain and ongoing vaginal bleeding.  Patient was recently seen by PCP for this.  She had PT/INR and APTT which were normal and a and a transvaginal ultrasound which was reassuring.  Patient states today that her symptoms are continuing.  She denies any new symptoms other than "my heart hurts".  She describes left-sided chest pain that started this morning.  No associated shortness of breath.  She reports chills.  No cough or sore throat.  She reports nausea and occasional vomiting.       Home Medications Prior to Admission medications   Not on File      Allergies    Sulfa antibiotics, Sulfasalazine, Amphetamines, Other, and Monosodium glutamate    Review of Systems   Review of Systems  Physical Exam Updated Vital Signs BP 114/87 (BP Location: Left Arm)   Pulse (!) 58   Temp 97.6 F (36.4 C) (Oral)   Resp 20   Ht 5\' 8"  (1.727 m)   Wt 82.1 kg   LMP 10/17/2023 (Exact Date) Comment: pt sts she has had vaginal bleeding since giving birth 09/02/23  SpO2 98%   Breastfeeding No   BMI 27.52 kg/m   Physical Exam Vitals and nursing note reviewed.  Constitutional:      General: She is not in acute distress.    Appearance: She is well-developed.  HENT:     Head: Normocephalic and atraumatic.     Right Ear: External ear normal.     Left Ear: External ear normal.     Nose: Nose normal.  Eyes:     Conjunctiva/sclera: Conjunctivae normal.  Cardiovascular:     Rate and Rhythm: Normal rate and regular rhythm.     Heart sounds: No murmur  heard. Pulmonary:     Effort: No respiratory distress.     Breath sounds: No wheezing, rhonchi or rales.  Abdominal:     Palpations: Abdomen is soft.     Tenderness: There is abdominal tenderness. There is no guarding or rebound.     Comments: Generalized, nonfocal tenderness reported to palpation.  Musculoskeletal:     Cervical back: Normal range of motion and neck supple.     Right lower leg: No edema.     Left lower leg: No edema.  Skin:    General: Skin is warm and dry.     Findings: No rash.  Neurological:     General: No focal deficit present.     Mental Status: She is alert. Mental status is at baseline.     Motor: No weakness.  Psychiatric:        Attention and Perception: Attention normal.        Mood and Affect: Affect is angry.        Speech: Speech normal.        Behavior: Behavior is aggressive.     ED Results / Procedures / Treatments   Labs (all labs ordered are listed, but only abnormal results are displayed) Labs Reviewed  CBC WITH DIFFERENTIAL/PLATELET  COMPREHENSIVE METABOLIC PANEL  HCG, SERUM, QUALITATIVE  URINALYSIS,  ROUTINE W REFLEX MICROSCOPIC  RAPID URINE DRUG SCREEN, HOSP PERFORMED  TROPONIN I (HIGH SENSITIVITY)    EKG None  Radiology US Pelvic Complete With Transvaginal Result Date: 11/25/2023 CLINICAL DATA:  Postpartum bleeding. EXAM: TRANSABDOMINAL AND TRANSVAGINAL ULTRASOUND OF PELVIS TECHNIQUE: Both transabdominal and transvaginal ultrasound examinations of the pelvis were performed. Transabdominal technique was performed for global imaging of the pelvis including uterus, ovaries, adnexal regions, and pelvic cul-de-sac. It was necessary to proceed with endovaginal exam following the transabdominal exam to visualize the endometrium and ovaries. COMPARISON:  Ultrasound dated 10/09/2023. FINDINGS: Uterus Measurements: 8.6 x 3.9 x 5.4 cm = volume: 93 mL. No fibroids or other mass visualized. Endometrium Thickness: 2 mm.  No focal abnormality  visualized. Right ovary Measurements: 2.9 x 2.8 x 2.4 cm = volume: 10.1 mL. Normal appearance/no adnexal mass. Small right ovarian dominant follicle or cyst. No imaging follow-up. Left ovary Measurements: 2.6 x 1.4 x 2.1 cm = volume: 4.0 mL. Normal appearance/no adnexal mass. Other findings No abnormal free fluid. IMPRESSION: Unremarkable pelvic ultrasound. Electronically Signed   By: Elgie Collard M.D.   On: 11/25/2023 15:52    Procedures Procedures    Medications Ordered in ED Medications - No data to display  ED Course/ Medical Decision Making/ A&P    Patient seen and examined. History obtained directly from patient.  Reviewed recent workup and history.  Reviewed psychiatry notes from earlier in the month.  Patient discussed with Dr. Jacqulyn Bath.   Patient becomes angry very easily when recounting recent healthcare experiences.  She is demanding admission to the hospital today so that someone can figure out what is going on with her.  She endorses barriers to being able to follow-up with OB/GYN.  Patient uses profane language during history.  I attempted to set expectations, explaining that we would evaluate for her for dangerous or life-threatening medical conditions, treat as indicated, however could not assure her that she would meet indications for admission today.  Labs/EKG: Ordered CBC, CMP, troponin, pregnancy, UA, UDS.  Imaging: Ordered chest x-ray  Medications/Fluids: Ordered: Toradol.   Most recent vital signs reviewed and are as follows: BP 114/87 (BP Location: Left Arm)   Pulse (!) 58   Temp 97.6 F (36.4 C) (Oral)   Resp 20   Ht 5\' 8"  (1.727 m)   Wt 82.1 kg   LMP 10/17/2023 (Exact Date) Comment: pt sts she has had vaginal bleeding since giving birth 09/02/23  SpO2 98%   Breastfeeding No   BMI 27.52 kg/m   Initial impression: Abnormal vaginal bleeding, ongoing, chest pain  12:23 PM Reassessment performed. Patient appears comfortable.  She is eating [employer],  asking for something sweet.  Calm demeanor.   Labs personally reviewed and interpreted including: CBC unremarkable with normal hemoglobin; CMP unremarkable with slightly low calcium and protein; pregnancy negative; troponin normal at 3; UA demonstrate some blood otherwise no signs of infection; UDS positive for THC.  Imaging personally visualized and interpreted including: Chest x-ray, agree negative.  Reviewed pertinent lab work and imaging with patient at bedside. Questions answered.   Most current vital signs reviewed and are as follows: BP 114/87 (BP Location: Left Arm)   Pulse (!) 58   Temp 97.6 F (36.4 C) (Oral)   Resp 20   Ht 5\' 8"  (1.727 m)   Wt 82.1 kg   LMP 10/17/2023 (Exact Date) Comment: pt sts she has had vaginal bleeding since giving birth 09/02/23  SpO2 98%   Breastfeeding No   BMI  27.52 kg/m   Plan: Discharge to home.  Patient states that after she has been eating, she will walk up stairs to get an appointment.  Prescriptions written for: None  Other home care instructions discussed: OTC NSAIDs/Tylenol for pain  ED return instructions discussed: Return with new or worsening symptoms  Follow-up instructions discussed: Patient encouraged to follow-up with their PCP in 3 days.                                   Medical Decision Making Amount and/or Complexity of Data Reviewed Labs: ordered. Radiology: ordered.   For this patient's complaint of abdominal pain, the following conditions were considered on the differential diagnosis: gastritis/PUD, enteritis/duodenitis, appendicitis, cholelithiasis/cholecystitis, cholangitis, pancreatitis, ruptured viscus, colitis, diverticulitis, small/large bowel obstruction, proctitis, cystitis, pyelonephritis, ureteral colic, aortic dissection, aortic aneurysm. In women, ectopic pregnancy, pelvic inflammatory disease, ovarian cysts, and tubo-ovarian abscess were also considered. Atypical chest etiologies were also considered  including ACS, PE, and pneumonia.  Patient has persistent vaginal bleeding.  Her hemoglobin is normal.  I do not feel that she requires emergent OB/GYN consultation, but would benefit for outpatient evaluation.  Recent imaging negative.  Patient also reported some chest pain.  Cardiac workup is reassuring.  Patient looks well.  Low concern for ACS, PE, pneumonia, dissection, pneumothorax.  The patient's vital signs, pertinent lab work and imaging were reviewed and interpreted as discussed in the ED course. Hospitalization was considered for further testing, treatments, or serial exams/observation. However as patient is well-appearing, has a stable exam, and reassuring studies today, I do not feel that they warrant admission at this time. This plan was discussed with the patient who verbalizes agreement and comfort with this plan and seems reliable and able to return to the Emergency Department with worsening or changing symptoms.          Final Clinical Impression(s) / ED Diagnoses Final diagnoses:  Abnormal uterine bleeding (AUB)  Generalized abdominal pain    Rx / DC Orders ED Discharge Orders     None         Renne Crigler, PA-C 11/27/23 1226    Long, Arlyss Repress, MD 11/27/23 1547

## 2023-11-27 NOTE — Telephone Encounter (Signed)
Pt was eval in ED, originally had appt scheduled w/ [place] today and just rescheduled.

## 2023-12-01 ENCOUNTER — Ambulatory Visit: Payer: MEDICAID | Admitting: Physician Assistant

## 2023-12-07 ENCOUNTER — Encounter: Payer: Self-pay | Admitting: *Deleted

## 2023-12-11 ENCOUNTER — Telehealth: Payer: MEDICAID | Admitting: Physician Assistant

## 2023-12-11 DIAGNOSIS — R109 Unspecified abdominal pain: Secondary | ICD-10-CM

## 2023-12-11 NOTE — Progress Notes (Signed)
Virtual Visit Consent   Misty Jimenez, you are scheduled for a virtual visit with a [facility] provider today. Just as with appointments in the office, your consent must be obtained to participate. Your consent will be active for this visit and any virtual visit you may have with one of our providers in the next 365 days. If you have a MyChart account, a copy of this consent can be sent to you electronically.  As this is a virtual visit, video technology does not allow for your provider to perform a traditional examination. This may limit your provider's ability to fully assess your condition. If your provider identifies any concerns that need to be evaluated in person or the need to arrange testing (such as labs, EKG, etc.), we will make arrangements to do so. Although advances in technology are sophisticated, we cannot ensure that it will always work on either your end or our end. If the connection with a video visit is poor, the visit may have to be switched to a telephone visit. With either a video or telephone visit, we are not always able to ensure that we have a secure connection.  By engaging in this virtual visit, you consent to the provision of healthcare and authorize for your insurance to be billed (if applicable) for the services provided during this visit. Depending on your insurance coverage, you may receive a charge related to this service.  I need to obtain your verbal consent now. Are you willing to proceed with your visit today? Misty Jimenez has provided verbal consent on 12/11/2023 for a virtual visit (video or telephone). Laure Kidney, [place]  Date: 12/11/2023 9:06 PM   Virtual Visit via Video Note   I, Misty Jimenez, connected with  Misty Jimenez  ([id number], 05-01-1996) on 12/11/23 at  9:00 PM EST by a video-enabled telemedicine application and verified that I am speaking with the correct person using two identifiers.  Location: Patient: Virtual Visit Location  Patient: Home Provider: Virtual Visit Location Provider: Home Office   I discussed the limitations of evaluation and management by telemedicine and the availability of in person appointments. The patient expressed understanding and agreed to proceed.    History of Present Illness: Misty Jimenez is a 28 y.o. who identifies as a female who was assigned female at birth, and is being seen today for abdominal pain.  HPI: Abdominal Pain This is a new problem. The current episode started today. The onset quality is sudden. The problem has been rapidly worsening. The pain is located in the generalized abdominal region. The pain is at a severity of 10/10. The pain is severe. The abdominal pain does not radiate. Associated symptoms include nausea and vomiting. Pertinent negatives include no weight loss. Nothing aggravates the pain. The pain is relieved by Nothing. She has tried nothing for the symptoms. The treatment provided no relief. 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  Observations/Objective:    Assessment and Plan: 1. Abdominal pain, unspecified abdominal location (Primary)  Patient is a 28 year old female presenting with abdominal pain nausea and vomiting times multiple days.  Patient states that she previously gave birth in November has had vaginal bleeding with associated nausea vomiting and abdominal pain since that time.  I advised the patient that due to her symptoms and history she will need to immediately report to emergency room for evaluation.  I was unable to visualize this patient on camera during the telehealth appointment however I was able to contact her via phone.  She then had an episode of emesis while on the telephone during her visit.  I again advised her to report to the nearest emergency room for evaluation she verbalized understanding.  Follow Up Instructions: I discussed the assessment and treatment plan with the patient. The patient was provided an opportunity to ask questions and all were answered. The patient agreed with the plan and demonstrated an understanding of the instructions.  A copy of instructions were sent to the patient via MyChart unless otherwise noted below.   The patient was advised to call back or seek an in-person evaluation if the symptoms worsen or if the condition fails to improve as anticipated.    Laure Kidney, PA-C

## 2023-12-11 NOTE — Patient Instructions (Signed)
  Misty Jimenez, thank you for joining Laure Kidney, PA-C for today's virtual visit.  While this provider is not your primary care provider (PCP), if your PCP is located in our provider database this encounter information will be shared with them immediately following your visit.   A [facility] MyChart account gives you access to today's visit and all your visits, tests, and labs performed at [facility] " click here if you don't have a [facility] MyChart account or go to [email or web address]  Consent: (Patient) Misty Jimenez provided verbal consent for this virtual visit at the beginning of the encounter.  Current Medications: No current outpatient medications on file.   Medications ordered in this encounter:  No orders of the defined types were placed in this encounter.    *If you need refills on other medications prior to your next appointment, please contact your pharmacy*  Follow-Up: Call back or seek an in-person evaluation if the symptoms worsen or if the condition fails to improve as anticipated.  [facility] [phone]  Other Instructions Report to nearest ER.    If you have been instructed to have an in-person evaluation today at a local Urgent Care facility, please use the link below. It will take you to a list of all of our available [facility], including address, phone number and hours of operation. Please do not delay care.  [facility]  If you or a family member do not have a primary care provider, use the link below to schedule a visit and establish care. When you choose a [facility] primary care physician or advanced practice provider, you gain a long-term partner in health. Find a Primary Care Provider  Learn more about [facility]'s in-office and virtual care options:  - Get Care Now

## 2023-12-12 ENCOUNTER — Other Ambulatory Visit: Payer: Self-pay

## 2023-12-12 ENCOUNTER — Emergency Department (HOSPITAL_BASED_OUTPATIENT_CLINIC_OR_DEPARTMENT_OTHER): Payer: MEDICAID

## 2023-12-12 ENCOUNTER — Encounter (HOSPITAL_BASED_OUTPATIENT_CLINIC_OR_DEPARTMENT_OTHER): Payer: Self-pay | Admitting: Emergency Medicine

## 2023-12-12 ENCOUNTER — Emergency Department (HOSPITAL_BASED_OUTPATIENT_CLINIC_OR_DEPARTMENT_OTHER)
Admission: EM | Admit: 2023-12-12 | Discharge: 2023-12-12 | Disposition: A | Discharge status: Home / Self Care | Payer: MEDICAID | Attending: Emergency Medicine | Admitting: Emergency Medicine

## 2023-12-12 DIAGNOSIS — F1210 Cannabis abuse, uncomplicated: Secondary | ICD-10-CM | POA: Insufficient documentation

## 2023-12-12 DIAGNOSIS — R300 Dysuria: Secondary | ICD-10-CM | POA: Insufficient documentation

## 2023-12-12 DIAGNOSIS — R112 Nausea with vomiting, unspecified: Secondary | ICD-10-CM | POA: Diagnosis not present

## 2023-12-12 DIAGNOSIS — R531 Weakness: Secondary | ICD-10-CM | POA: Insufficient documentation

## 2023-12-12 DIAGNOSIS — R1084 Generalized abdominal pain: Secondary | ICD-10-CM | POA: Diagnosis present

## 2023-12-12 DIAGNOSIS — R918 Other nonspecific abnormal finding of lung field: Secondary | ICD-10-CM

## 2023-12-12 LAB — CBC WITH DIFFERENTIAL/PLATELET
Abs Immature Granulocytes: 0.03 10*3/uL (ref 0.00–0.07)
Basophils Absolute: 0.1 10*3/uL (ref 0.0–0.1)
Basophils Relative: 1 %
Eosinophils Absolute: 0.1 10*3/uL (ref 0.0–0.5)
Eosinophils Relative: 1 %
HCT: 39.2 % (ref 36.0–46.0)
Hemoglobin: 13.2 g/dL (ref 12.0–15.0)
Immature Granulocytes: 0 %
Lymphocytes Relative: 16 %
Lymphs Abs: 1.6 10*3/uL (ref 0.7–4.0)
MCH: 30.6 pg (ref 26.0–34.0)
MCHC: 33.7 g/dL (ref 30.0–36.0)
MCV: 90.7 fL (ref 80.0–100.0)
Monocytes Absolute: 0.6 10*3/uL (ref 0.1–1.0)
Monocytes Relative: 6 %
Neutro Abs: 7.4 10*3/uL (ref 1.7–7.7)
Neutrophils Relative %: 76 %
Platelets: 272 10*3/uL (ref 150–400)
RBC: 4.32 MIL/uL (ref 3.87–5.11)
RDW: 12.3 % (ref 11.5–15.5)
WBC: 9.7 10*3/uL (ref 4.0–10.5)
nRBC: 0 % (ref 0.0–0.2)

## 2023-12-12 LAB — RAPID URINE DRUG SCREEN, HOSP PERFORMED
Amphetamines: NOT DETECTED
Barbiturates: NOT DETECTED
Benzodiazepines: NOT DETECTED
Cocaine: NOT DETECTED
Opiates: NOT DETECTED
Tetrahydrocannabinol: POSITIVE — AB

## 2023-12-12 LAB — COMPREHENSIVE METABOLIC PANEL
ALT: 22 U/L (ref 0–44)
AST: 34 U/L (ref 15–41)
Albumin: 3.9 g/dL (ref 3.5–5.0)
Alkaline Phosphatase: 58 U/L (ref 38–126)
Anion gap: 10 (ref 5–15)
BUN: 11 mg/dL (ref 6–20)
CO2: 21 mmol/L — ABNORMAL LOW (ref 22–32)
Calcium: 9 mg/dL (ref 8.9–10.3)
Chloride: 107 mmol/L (ref 98–111)
Creatinine, Ser: 0.78 mg/dL (ref 0.44–1.00)
GFR, Estimated: >60 mL/min (ref >60)
Glucose, Bld: 90 mg/dL (ref 70–99)
Potassium: 4 mmol/L (ref 3.5–5.1)
Sodium: 138 mmol/L (ref 135–145)
Total Bilirubin: 1.3 mg/dL — ABNORMAL HIGH (ref 0.0–1.2)
Total Protein: 6.7 g/dL (ref 6.5–8.1)

## 2023-12-12 LAB — URINALYSIS, MICROSCOPIC (REFLEX)

## 2023-12-12 LAB — URINALYSIS, ROUTINE W REFLEX MICROSCOPIC
Bilirubin Urine: NEGATIVE
Glucose, UA: NEGATIVE mg/dL
Ketones, ur: NEGATIVE mg/dL
Leukocytes,Ua: NEGATIVE
Nitrite: NEGATIVE
Protein, ur: NEGATIVE mg/dL
Specific Gravity, Urine: 1.025 (ref 1.005–1.030)
pH: 6 (ref 5.0–8.0)

## 2023-12-12 LAB — TROPONIN I (HIGH SENSITIVITY): Troponin I (High Sensitivity): <2 ng/L (ref <18)

## 2023-12-12 LAB — RESP PANEL BY RT-PCR (RSV, FLU A&B, COVID)  RVPGX2
Influenza A by PCR: NEGATIVE
Influenza B by PCR: NEGATIVE
Resp Syncytial Virus by PCR: NEGATIVE
SARS Coronavirus 2 by RT PCR: NEGATIVE

## 2023-12-12 LAB — LIPASE, BLOOD: Lipase: 24 U/L (ref 11–51)

## 2023-12-12 LAB — PREGNANCY, URINE: Preg Test, Ur: NEGATIVE

## 2023-12-12 LAB — CK: Total CK: 99 U/L (ref 38–234)

## 2023-12-12 MED ORDER — KETOROLAC TROMETHAMINE 30 MG/ML IJ SOLN
15.0000 mg | Freq: Once | INTRAMUSCULAR | Status: AC
Start: 2023-12-12 — End: 2023-12-12
  Administered 2023-12-12: 15 mg via INTRAVENOUS
  Filled 2023-12-12: qty 1

## 2023-12-12 MED ORDER — DROPERIDOL 2.5 MG/ML IJ SOLN
1.2500 mg | Freq: Once | INTRAMUSCULAR | Status: AC
  Administered 2023-12-12: 1.25 mg via INTRAVENOUS
  Filled 2023-12-12: qty 2

## 2023-12-12 MED ORDER — SODIUM CHLORIDE 0.9 % IV BOLUS
1000.0000 mL | Freq: Once | INTRAVENOUS | Status: AC
Start: 2023-12-12 — End: 2023-12-12
  Administered 2023-12-12: 1000 mL via INTRAVENOUS

## 2023-12-12 MED ORDER — LORAZEPAM 2 MG/ML IJ SOLN: 1.0000 mg | Freq: Once | INTRAMUSCULAR | Status: DC

## 2023-12-12 MED ORDER — MORPHINE SULFATE (PF) 4 MG/ML IV SOLN
4.0000 mg | Freq: Once | INTRAVENOUS | Status: AC
  Administered 2023-12-12: 4 mg via INTRAVENOUS
  Filled 2023-12-12: qty 1

## 2023-12-12 MED ORDER — SODIUM CHLORIDE 0.9 % IV SOLN
12.5000 mg | Freq: Once | INTRAVENOUS | Status: AC
  Administered 2023-12-12: 12.5 mg via INTRAVENOUS
  Filled 2023-12-12: qty 0.5

## 2023-12-12 MED ORDER — ONDANSETRON HCL 4 MG/2ML IJ SOLN
4.0000 mg | Freq: Once | INTRAMUSCULAR | Status: AC
  Administered 2023-12-12: 4 mg via INTRAVENOUS
  Filled 2023-12-12: qty 2

## 2023-12-12 MED ORDER — IOHEXOL 300 MG/ML  SOLN
80.0000 mL | Freq: Once | INTRAMUSCULAR | Status: AC | PRN
  Administered 2023-12-12: 80 mL via INTRAVENOUS

## 2023-12-12 MED ORDER — PROMETHAZINE HCL 25 MG/ML IJ SOLN
INTRAMUSCULAR | Status: AC
Start: 2023-12-12 — End: 2023-12-12
  Administered 2023-12-12: 12.5 mg
  Filled 2023-12-12: qty 1

## 2023-12-12 NOTE — Discharge Instructions (Addendum)
 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  We recommend that you stop all forms of marijuana.  Educational packet provided.  CT abdomen pelvis demonstrating no acute process in the abdomen to suggest your symptoms today.  However it did show an incidental finding of some opacities in the lower lung fields.  Please follow with your primary care physician for reevaluation on an outpatient setting.  No signs of pneumonia on history or physical exam.

## 2023-12-12 NOTE — ED Notes (Signed)
 Patient transported to CT

## 2023-12-12 NOTE — ED Triage Notes (Signed)
 Headache  and low back pain X 3 days,emesis X 2 days had uti and took meds was getting better but worse now.

## 2023-12-12 NOTE — ED Provider Notes (Signed)
 [place] [facility] Provider Note   CSN: [id number] Arrival date & time: 12/12/23  5956     History  Chief Complaint  Patient presents with   Back Pain    Misty Jimenez is a 28 y.o. female.  Patient agitated, tearful and anxious. Patient with substance abuse history, bipolar disorder, psychiatric admission earlier this month (1/10-1/15).  Seen in the ED with abdominal pain in January 31 with reassuring workup.  She is here with diffuse abdominal pain worse in her epigastrium with nausea, vomiting and diarrhea for the past 4 days.  She describes severe pain all over her abdomen but worse in the epigastric region.  States she has vomited 4-5 times today is nonbloody and nonbilious.  Also with frequent diarrhea as well.  She has difficulty making to the bathroom on time.  Diffuse crampy abdominal pain.  Believes she has had a fever but has not checked her temperature.  Admits to pain with urination and blood in her urine as well.  States she has had ongoing vaginal bleeding since she gave birth in November and this is unchanged.  She did have a normal pelvic ultrasound by her PCP.  Still has appendix and gallbladder.  Still has uterus and ovaries.  She has a history of PCOS as well as endometriosis but this does not feel like that.  The pain is everywhere in her back and mostly to her right flank.  No radiation of the pain down her legs.  No weakness to her legs.  No known fever but has felt warm. Denies any IV drug abuse.  Does use THC. Had kidney surgery as a child but no other abdominal surgeries.  She perseverates on believing that she has lupus.  The history is provided by the patient and a relative.  Back Pain Associated symptoms: abdominal pain, dysuria and weakness   Associated symptoms: no chest pain and no fever        Home Medications Prior to Admission medications   Not on File      Allergies    Sulfa antibiotics, Sulfasalazine,  Amphetamines, Other, and Monosodium glutamate    Review of Systems   Review of Systems  Constitutional:  Positive for activity change and appetite change. Negative for fever.  HENT:  Negative for congestion and rhinorrhea.   Respiratory:  Negative for cough, chest tightness and shortness of breath.   Cardiovascular:  Negative for chest pain.  Gastrointestinal:  Positive for abdominal pain, diarrhea, nausea and vomiting.  Genitourinary:  Positive for dysuria and hematuria.  Musculoskeletal:  Positive for arthralgias, back pain and myalgias.  Skin:  Negative for rash.  Neurological:  Positive for tremors and weakness.   all other systems are negative except as noted in the HPI and PMH.    Physical Exam Updated Vital Signs BP (!) 147/89 (BP Location: Left Arm)   Pulse 72   Temp 98 F (36.7 C) (Oral)   Resp (!) 22   Ht 5' 7.25" (1.708 m)   Wt 82 kg   SpO2 99%   Breastfeeding No   BMI 28.10 kg/m  Physical Exam Vitals and nursing note reviewed.  Constitutional:      General: She is in acute distress.     Appearance: She is well-developed.     Comments: Anxious and tearful  HENT:     Head: Normocephalic and atraumatic.     Mouth/Throat:     Pharynx: No oropharyngeal exudate.  Eyes:  Conjunctiva/sclera: Conjunctivae normal.     Pupils: Pupils are equal, round, and reactive to light.  Neck:     Comments: No meningismus. Cardiovascular:     Rate and Rhythm: Normal rate and regular rhythm.     Heart sounds: Normal heart sounds. No murmur heard. Pulmonary:     Effort: Pulmonary effort is normal. No respiratory distress.     Breath sounds: Normal breath sounds.  Abdominal:     Palpations: Abdomen is soft.     Tenderness: There is abdominal tenderness. There is no guarding or rebound.     Comments: Soft, no guarding or rebound, moderate epigastric tenderness  Musculoskeletal:        General: No tenderness. Normal range of motion.     Cervical back: Normal range of motion  and neck supple.     Comments: 5/5 strength in bilateral lower extremities. Ankle plantar and dorsiflexion intact. Great toe extension intact bilaterally. +2 DP and PT pulses. +2 patellar reflexes bilaterally. Normal gait.   Skin:    General: Skin is warm.  Neurological:     Mental Status: She is alert and oriented to person, place, and time.     Cranial Nerves: No cranial nerve deficit.     Motor: No abnormal muscle tone.     Coordination: Coordination normal.     Comments:  5/5 strength throughout. CN 2-12 intact.Equal grip strength.   Psychiatric:        Behavior: Behavior normal.     ED Results / Procedures / Treatments   Labs (all labs ordered are listed, but only abnormal results are displayed) Labs Reviewed  URINALYSIS, ROUTINE W REFLEX MICROSCOPIC - Abnormal; Notable for the following components:      Result Value   Hgb urine dipstick LARGE (*)    All other components within normal limits  COMPREHENSIVE METABOLIC PANEL - Abnormal; Notable for the following components:   CO2 21 (*)    Total Bilirubin 1.3 (*)    All other components within normal limits  RAPID URINE DRUG SCREEN, HOSP PERFORMED - Abnormal; Notable for the following components:   Tetrahydrocannabinol POSITIVE (*)    All other components within normal limits  URINALYSIS, MICROSCOPIC (REFLEX) - Abnormal; Notable for the following components:   Bacteria, UA FEW (*)    All other components within normal limits  RESP PANEL BY RT-PCR (RSV, FLU A&B, COVID)  RVPGX2  PREGNANCY, URINE  CBC WITH DIFFERENTIAL/PLATELET  LIPASE, BLOOD  CK  TROPONIN I (HIGH SENSITIVITY)  TROPONIN I (HIGH SENSITIVITY)    EKG EKG Interpretation Date/Time:  Saturday December 12 2023 05:55:03 EST Ventricular Rate:  64 PR Interval:  140 QRS Duration:  77 QT Interval:  426 QTC Calculation: 440 R Axis:   80  Text Interpretation: Sinus rhythm ST elev, probable normal early repol pattern No significant change was found Confirmed by  Glynn Octave [phone]) on 12/12/2023 6:00:33 AM  Radiology No results found.  Procedures Procedures    Medications Ordered in ED Medications  sodium chloride 0.9 % bolus 1,000 mL (has no administration in time range)  ondansetron (ZOFRAN) injection 4 mg (has no administration in time range)  morphine (PF) 4 MG/ML injection 4 mg (has no administration in time range)    ED Course/ Medical Decision Making/ A&P                                 Medical Decision Making Amount  and/or Complexity of Data Reviewed Labs: ordered. Decision-making details documented in ED Course. Radiology: ordered and independent interpretation performed. Decision-making details documented in ED Course. ECG/medicine tests: ordered and independent interpretation performed. Decision-making details documented in ED Course.  Risk Prescription drug management.  Abdominal pain with nausea, vomiting and diarrhea.  Stable vital signs.  Abdomen soft without peritoneal signs. Equal strength in lower extremities with equal pulses, sensation and reflexes.  Low concern for cord compression or cauda equina.  Patient anxious, writhing around the bed, using profane language.  Will obtain basic labs, imaging, treat symptoms.  She is asking for something for anxiety but states she cannot take Ativan because she is "allergic to it" as it causes skin burning and redness.  She is requesting hydroxyzine but discussed that this is a pill which she will likely not be able to keep down.  Will give Phenergan for nausea.  hCG is negative.  Labs are reassuring. Urinalysis negative for infection.  Does have large hemoglobin but this has been consistently the case since November.  She reports ongoing vaginal bleeding since giving birth at that time.  Had pelvic ultrasound that was reassuring.  LFTs and lipase are normal.  No significant leukocytosis.  Consider cannabinol hyperemesis syndrome as cause of her abdominal pain.  Will give  Phenergan and droperidol.  Recent workup reviewed.  Patient has had MRI of her lumbar spine in January as well as pelvic ultrasound and CT scan.  Patient feels improved after medications in the ED.  Her abdomen is soft without peritoneal signs.  Will obtain CT imaging for further evaluation of her abdominal pain.  Consider cannabinol hyperemesis syndrome causing her abdominal pain and vomiting.  Care to be transferred at shift change pending imaging and reassessment.        Final Clinical Impression(s) / ED Diagnoses Final diagnoses:  Generalized abdominal pain    Rx / DC Orders ED Discharge Orders     None         Gina Leblond, Jeannett Senior, MD 12/12/23 [phone]

## 2023-12-12 NOTE — ED Provider Notes (Signed)
 7:05 AM Patient signed out to me by previous ED physician. Pt is a 28 yo female presenting for n/v/d. Cannabis use.   IVF, Zofran, then Toradol and phenergan given for symptomatic control.   Physical Exam  BP (!) 108/50   Pulse (!) 41   Temp 98 F (36.7 C) (Oral)   Resp (!) 24   Ht 5' 7.25" (1.708 m)   Wt 82 kg   SpO2 97%   Breastfeeding No   BMI 28.10 kg/m   Physical Exam  Procedures  Procedures  ED Course / MDM    Medical Decision Making Amount and/or Complexity of Data Reviewed Labs: ordered. Radiology: ordered. ECG/medicine tests: ordered.  Risk Prescription drug management.   Symptoms improved. Patient ready for dc. Symptoms likely due to cannabis induced hyperemesis. UDS positive.Pt recommended to stop.   Patient in no distress and overall condition improved here in the ED. Detailed discussions were had with the patient regarding current findings, and need for close f/u with PCP or on call doctor. The patient has been instructed to return immediately if the symptoms worsen in any way for re-evaluation. Patient verbalized understanding and is in agreement with current care plan. All questions answered prior to discharge.        Franne Forts, DO 12/16/23 2202

## 2023-12-25 ENCOUNTER — Ambulatory Visit: Payer: MEDICAID | Admitting: Physician Assistant

## 2024-01-25 NOTE — Progress Notes (Deleted)
 [facility] [phone]   [phone]  INITIAL CONSULT NOTE  Patient Care Team: Alfredia Ferguson, PA-C as PCP - General (Physician Assistant) Levie Heritage, DO as PCP - OBGYN (Family Medicine) Deetta Perla, MD (Inactive) as Attending Physician (Neurology)  Hematological/Oncological History # ***  CHIEF COMPLAINTS/PURPOSE OF CONSULTATION:  Easy bruising  HISTORY OF PRESENTING ILLNESS:  Misty Jimenez 28 y.o. female with medical history significant for ***  On review of the previous records ***  On exam today ***  MEDICAL HISTORY:  Past Medical History:  Diagnosis Date   Abdominal pain, chronic, right lower quadrant 08/03/2013   Abdominal pain, recurrent    With Headache   Acute pyelonephritis 05/07/2016   kidney surgery at 30 months of age   Acute respiratory failure with hypoxia (HCC) 08/24/2023   ADHD (attention deficit hyperactivity disorder)    Allergy    Anxiety    Bipolar affective disorder, current episode manic with psychotic symptoms (HCC) 02/28/2021   "I was so depressed after losing my job, I was thinking of cutting myself."     Bulimia 04/17/2023   Cannabis hyperemesis syndrome concurrent with and due to cannabis abuse (HCC) 10/12/2020   Closed fracture of distal end of left radius 06/19/2016   Current smoker 10/28/2018   Depression    Endometriosis    Endometritis 07/23/2017   Family history of adverse reaction to anesthesia    "grandmother gets PONV"   GE reflux 12/16/2011   Hyperlipidemia    Kidney disease 05/07/2016   Left breast abscess 12/18/2021   LGSIL on Pap smear of cervix 04/13/2023   03/2023 LSIL - PAP in 1 year     Moderate cannabis use disorder (HCC) 10/28/2018   Obesity 05/07/2016   PCOS (polycystic ovarian syndrome)    Preventative health care 11/04/2016   Reflux, vesicoureteral    Respiratory illness with fever 12/27/2019   Wrist pain, left 11/04/2016    SURGICAL HISTORY: Past  Surgical History:  Procedure Laterality Date   KIDNEY SURGERY     As a small child   TYMPANOSTOMY TUBE PLACEMENT     URETER SURGERY     WRIST SURGERY Left 05/2016   10 pins and 2 plates    SOCIAL HISTORY: Social History   Socioeconomic History   Marital status: Married    Spouse name: Not on file   Number of children: Not on file   Years of education: Not on file   Highest education level: Not on file  Occupational History   Not on file  Tobacco Use   Smoking status: Some Days    Current packs/day: 0.00    Average packs/day: 0.3 packs/day for 2.0 years (0.5 ttl pk-yrs)    Types: Cigars, Cigarettes    Start date: 10/27/2013    Last attempt to quit: 10/28/2015    Years since quitting: 8.2   Smokeless tobacco: Former    Types: Chew   Tobacco comments:    Pt declines information  Vaping Use   Vaping status: Former  Substance and Sexual Activity   Alcohol use: Not Currently    Alcohol/week: 2.0 - 5.0 standard drinks of alcohol    Types: 2 - 5 Cans of beer per week    Comment: 3-4 x week   Drug use: Not Currently    Types: Marijuana, Methamphetamines   Sexual activity: Yes  Other Topics Concern   Not on file  Social History Narrative   Lives in boyfriend, completing  High school   No dietary restrictions has a history of binge eating.. Former smoker, no alcohol or drug use.   Social Drivers of Health   Financial Resource Strain: Medium Risk (11/26/2020)   [facility]   Overall Financial Resource Strain (CARDIA)    Difficulty of Paying Living Expenses: Somewhat hard  Food Insecurity: No Food Insecurity (11/06/2023)   Hunger Vital Sign    Worried About Running Out of Food in the Last Year: Never true    Ran Out of Food in the Last Year: Never true  Transportation Needs: No Transportation Needs (11/06/2023)   PRAPARE - Administrator, Civil Service (Medical): No    Lack of Transportation  (Non-Medical): No  Physical Activity: Insufficiently Active (11/26/2020)   [facility]   Exercise Vital Sign    Days of Exercise per Week: 3 days    Minutes of Exercise per Session: 40 min  Stress: Stress Concern Present (11/26/2020)   [facility]   [employer] of Occupational Health - Occupational Stress Questionnaire    Feeling of Stress : Very much  Social Connections: Unknown (03/07/2022)   [facility]   Social Network    Social Network: Not on file  Intimate Partner Violence: Not At Risk (11/06/2023)   Humiliation, Afraid, Rape, and Kick questionnaire    Fear of Current or Ex-Partner: No    Emotionally Abused: No    Physically Abused: No    Sexually Abused: No    FAMILY HISTORY: Family History  Problem Relation Age of Onset   Kidney disease Mother    Alcohol abuse Father        and drug use, heroin, cocaine, marijuana   Migraines Maternal Aunt    Cancer Paternal Aunt        colon in 16s deceased   Asthma Maternal Grandmother    Diabetes Maternal Grandfather    Hyperlipidemia Other    Hypertension Other    Depression Other    Stroke Neg Hx     ALLERGIES:  is allergic to sulfa antibiotics, sulfasalazine, amphetamines, other, and monosodium glutamate.  MEDICATIONS:  No current outpatient medications on file.   No current facility-administered medications for this visit.    REVIEW OF SYSTEMS:   Constitutional: ( - ) fevers, ( - )  chills , ( - ) night sweats Eyes: ( - ) blurriness of vision, ( - ) double vision, ( - ) watery eyes Ears, nose, mouth, throat, and face: ( - ) mucositis, ( - ) sore throat Respiratory: ( - ) cough, ( - ) dyspnea, ( - ) wheezes Cardiovascular: ( - ) palpitation, ( - ) chest discomfort, ( - ) lower extremity swelling Gastrointestinal:  ( - ) nausea, ( - ) heartburn,  ( - ) change in bowel habits Skin: ( - ) abnormal skin rashes Lymphatics: ( - ) new lymphadenopathy, ( - ) easy bruising Neurological: ( - ) numbness, ( - ) tingling, ( - ) new weaknesses Behavioral/Psych: ( - ) mood change, ( - ) new changes  All other systems were reviewed with the patient and are negative.  PHYSICAL EXAMINATION: ECOG PERFORMANCE STATUS: {CHL ONC ECOG [id number]}  There were no vitals filed for this visit. There were no vitals filed for this visit.  GENERAL:  well appearing *** in NAD  SKIN: skin color, texture, turgor are normal, no rashes or significant lesions EYES: conjunctiva are pink and non-injected, sclera clear OROPHARYNX: no exudate, no erythema; lips, buccal mucosa, and tongue normal  NECK: supple, non-tender LYMPH:  no palpable lymphadenopathy in the cervical, axillary or supraclavicular lymph nodes.  LUNGS: clear to auscultation and percussion with normal breathing effort HEART: regular rate & rhythm and no murmurs and no lower extremity edema ABDOMEN: soft, non-tender, non-distended, normal bowel sounds Musculoskeletal: no cyanosis of digits and no clubbing  PSYCH: alert & oriented x 3, fluent speech NEURO: no focal motor/sensory deficits  LABORATORY DATA:  I have reviewed the data as listed    Latest Ref Rng & Units 12/12/2023    5:25 AM 11/27/2023   10:55 AM 11/10/2023    9:11 AM  CBC  WBC 4.0 - 10.5 K/uL 9.7  6.7  8.8   Hemoglobin 12.0 - 15.0 g/dL 16.1  09.6  04.5   Hematocrit 36.0 - 46.0 % 39.2  36.3  38.3   Platelets 150 - 400 K/uL 272  350  377.0        Latest Ref Rng & Units 12/12/2023    5:25 AM 11/27/2023   10:55 AM 11/10/2023    9:11 AM  CMP  Glucose 70 - 99 mg/dL 90  80  79   BUN 6 - 20 mg/dL 11  13  9    Creatinine 0.44 - 1.00 mg/dL 4.09  8.11  9.14   Sodium 135 - 145 mmol/L 138  137  141   Potassium 3.5 - 5.1 mmol/L 4.0  3.6  3.7   Chloride 98 - 111 mmol/L 107  107  103   CO2 22 - 32 mmol/L 21  22  28    Calcium 8.9 - 10.3  mg/dL 9.0  8.8  9.2   Total Protein 6.5 - 8.1 g/dL 6.7  6.3  6.4   Total Bilirubin 0.0 - 1.2 mg/dL 1.3  0.7  0.6   Alkaline Phos 38 - 126 U/L 58  60  68   AST 15 - 41 U/L 34  22  19   ALT 0 - 44 U/L 22  10  10       PATHOLOGY: ***  BLOOD FILM: *** Review of the peripheral blood smear showed normal appearing white cells with neutrophils that were appropriately lobated and granulated. There was no predominance of bi-lobed or hyper-segmented neutrophils appreciated. No Dohle bodies were noted. There was no left shifting, immature forms or blasts noted. Lymphocytes remain normal in size without any predominance of large granular lymphocytes. Red cells show no anisopoikilocytosis, macrocytes , microcytes or polychromasia. There were no schistocytes, target cells, echinocytes, acanthocytes, dacrocytes, or stomatocytes.There was no rouleaux formation, nucleated red cells, or intra-cellular inclusions noted. The platelets are normal in size, shape, and color without any clumping evident.  RADIOGRAPHIC STUDIES: I have personally reviewed the radiological images as listed and agreed with the findings in the report. No results found.  ASSESSMENT & PLAN ***  No orders of the defined types were placed in this encounter.   All questions were answered. The patient knows to call the clinic with any problems, questions or concerns.  I have spent a total of {CHL ONC TIME VISIT - [id number]} minutes of face-to-face and non-face-to-face time, preparing to see the patient, obtaining and/or reviewing separately obtained history, performing a medically appropriate examination, counseling and educating the patient, ordering medications/tests/procedures, referring and communicating with other  health care professionals, documenting clinical information in the electronic health record, independently interpreting results and communicating results to the patient, and care coordination.   Georga Kaufmann,  PA-C Department of Hematology/Oncology [facility] Phone: [phone]

## 2024-01-26 ENCOUNTER — Inpatient Hospital Stay: Payer: MEDICAID

## 2024-01-26 ENCOUNTER — Inpatient Hospital Stay: Payer: MEDICAID | Attending: Physician Assistant | Admitting: Physician Assistant

## 2024-10-03 ENCOUNTER — Encounter: Payer: Self-pay | Admitting: Family Medicine
